# Patient Record
Sex: Female | Born: 1965 | Race: White | Hispanic: No | Smoking: Current every day smoker
Health system: Southern US, Community
[De-identification: ages and names within clinical notes are randomized; demographics above are authoritative.]

## PROBLEM LIST (undated history)

## (undated) DIAGNOSIS — K219 Gastro-esophageal reflux disease without esophagitis: Secondary | ICD-10-CM

## (undated) DIAGNOSIS — F329 Major depressive disorder, single episode, unspecified: Secondary | ICD-10-CM

## (undated) DIAGNOSIS — I509 Heart failure, unspecified: Secondary | ICD-10-CM

## (undated) DIAGNOSIS — F1910 Other psychoactive substance abuse, uncomplicated: Secondary | ICD-10-CM

## (undated) DIAGNOSIS — I10 Essential (primary) hypertension: Secondary | ICD-10-CM

## (undated) DIAGNOSIS — F319 Bipolar disorder, unspecified: Secondary | ICD-10-CM

## (undated) DIAGNOSIS — J449 Chronic obstructive pulmonary disease, unspecified: Secondary | ICD-10-CM

## (undated) DIAGNOSIS — R7301 Impaired fasting glucose: Secondary | ICD-10-CM

## (undated) DIAGNOSIS — F32A Depression, unspecified: Secondary | ICD-10-CM

## (undated) DIAGNOSIS — I309 Acute pericarditis, unspecified: Secondary | ICD-10-CM

## (undated) DIAGNOSIS — F988 Other specified behavioral and emotional disorders with onset usually occurring in childhood and adolescence: Secondary | ICD-10-CM

## (undated) HISTORY — DX: Other specified behavioral and emotional disorders with onset usually occurring in childhood and adolescence: F98.8

## (undated) HISTORY — DX: Essential (primary) hypertension: I10

## (undated) HISTORY — DX: Major depressive disorder, single episode, unspecified: F32.9

## (undated) HISTORY — PX: ORTHOPEDIC SURGERY: SHX850

## (undated) HISTORY — DX: Depression, unspecified: F32.A

## (undated) HISTORY — DX: Gastro-esophageal reflux disease without esophagitis: K21.9

## (undated) HISTORY — PX: TUBAL LIGATION: SHX77

## (undated) HISTORY — DX: Chronic obstructive pulmonary disease, unspecified: J44.9

## (undated) HISTORY — DX: Impaired fasting glucose: R73.01

---

## 1999-12-09 ENCOUNTER — Emergency Department (HOSPITAL_COMMUNITY): Admission: EM | Admit: 1999-12-09 | Discharge: 1999-12-09 | Payer: Self-pay | Admitting: Emergency Medicine

## 2003-06-20 ENCOUNTER — Ambulatory Visit (HOSPITAL_COMMUNITY): Admission: RE | Admit: 2003-06-20 | Discharge: 2003-06-20 | Payer: Self-pay | Admitting: Family Medicine

## 2004-04-07 ENCOUNTER — Ambulatory Visit (HOSPITAL_COMMUNITY): Admission: RE | Admit: 2004-04-07 | Discharge: 2004-04-07 | Payer: Self-pay | Admitting: Family Medicine

## 2006-09-16 ENCOUNTER — Inpatient Hospital Stay (HOSPITAL_COMMUNITY): Admission: EM | Admit: 2006-09-16 | Discharge: 2006-09-20 | Payer: Self-pay | Admitting: Psychiatry

## 2006-09-16 ENCOUNTER — Ambulatory Visit: Payer: Self-pay | Admitting: Psychiatry

## 2007-07-14 ENCOUNTER — Other Ambulatory Visit: Payer: Self-pay

## 2007-07-14 ENCOUNTER — Inpatient Hospital Stay (HOSPITAL_COMMUNITY): Admission: AD | Admit: 2007-07-14 | Discharge: 2007-07-18 | Payer: Self-pay | Admitting: *Deleted

## 2007-07-14 ENCOUNTER — Ambulatory Visit: Payer: Self-pay | Admitting: *Deleted

## 2008-03-19 ENCOUNTER — Encounter: Payer: Self-pay | Admitting: Orthopedic Surgery

## 2008-03-19 ENCOUNTER — Ambulatory Visit (HOSPITAL_COMMUNITY): Admission: RE | Admit: 2008-03-19 | Discharge: 2008-03-19 | Payer: Self-pay | Admitting: Family Medicine

## 2008-04-13 ENCOUNTER — Encounter: Payer: Self-pay | Admitting: Orthopedic Surgery

## 2008-04-16 ENCOUNTER — Ambulatory Visit: Payer: Self-pay | Admitting: Orthopedic Surgery

## 2008-04-16 DIAGNOSIS — G609 Hereditary and idiopathic neuropathy, unspecified: Secondary | ICD-10-CM | POA: Insufficient documentation

## 2008-04-16 DIAGNOSIS — G5600 Carpal tunnel syndrome, unspecified upper limb: Secondary | ICD-10-CM | POA: Insufficient documentation

## 2008-05-09 ENCOUNTER — Ambulatory Visit: Payer: Self-pay | Admitting: Orthopedic Surgery

## 2010-03-20 ENCOUNTER — Ambulatory Visit (HOSPITAL_COMMUNITY): Admission: RE | Admit: 2010-03-20 | Discharge: 2010-03-20 | Payer: Self-pay | Admitting: Family Medicine

## 2010-04-05 ENCOUNTER — Emergency Department (HOSPITAL_COMMUNITY): Admission: EM | Admit: 2010-04-05 | Discharge: 2010-04-05 | Payer: Self-pay | Admitting: Emergency Medicine

## 2010-09-15 ENCOUNTER — Other Ambulatory Visit (HOSPITAL_COMMUNITY): Payer: Self-pay | Admitting: Family Medicine

## 2010-09-15 DIAGNOSIS — R1011 Right upper quadrant pain: Secondary | ICD-10-CM

## 2010-09-17 ENCOUNTER — Ambulatory Visit (HOSPITAL_COMMUNITY)
Admission: RE | Admit: 2010-09-17 | Discharge: 2010-09-17 | Disposition: A | Discharge status: Home / Self Care | Payer: Federal, State, Local not specified - PPO | Source: Ambulatory Visit | Attending: Family Medicine | Admitting: Family Medicine

## 2010-09-17 DIAGNOSIS — R1011 Right upper quadrant pain: Secondary | ICD-10-CM

## 2010-10-14 NOTE — H&P (Signed)
[name], [name]          ACCOUNT NO.:  [id number]   MEDICAL RECORD NO.:  [id number]          PATIENT TYPE:  IPS   LOCATION:  0303                          FACILITY:  [facility]   PHYSICIAN:  Anselm Jungling, MD  DATE OF BIRTH:  December 09, 1965   DATE OF ADMISSION:  07/14/2007  DATE OF DISCHARGE:                       PSYCHIATRIC ADMISSION ASSESSMENT   PSYCHIATRIC ADMIT NOTE   This is a 45 year old female voluntarily admitted on July 14, 2007.   HISTORY OF PRESENT ILLNESS:  The patient presents with a history  depression and auditory hallucinations.  She states this has happened to  her at least 3 times.  She hears her mother's voice, stating her mother  calls her name, and she also hears her friend [name], also calling her  name.  Her mother is alive and well.  She has been experiencing suicidal  thoughts.  States that she knows what she could do but has no specific  plan.  Her sleeping has been decreased.  She has recently began using  heroin again.  Last use was on Friday, using IV heroin, also abusing her  Klonopin.  Her stressors are financial problems and states her job is  stressful and receiving no child support.   PAST PSYCHIATRIC HISTORY:  Second admission to [facility].  Her  first admission was when she was detoxed off of heroin.  She has no  current psychiatric outpatient services.  She was in rehab in April  2008, in [facility].   SOCIAL HISTORY:  She is a 46 year old single female.  She has two  children, age 10 and 70.  The 16 year old is with the patient's mother  at this time.  The 22 year old is in a group home.  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   FAMILY HISTORY:  Her daughter, who is bipolar.   ALCOHOL AND DRUG USE:  The patient smokes and recently has been using IV  heroin, and abusing her Klonopin.   PRIMARY CARE [name]:  Dr. Lilyan Punt.   MEDICAL PROBLEMS:  She denies any known  medical issues.   MEDICATIONS:  1. Celexa 40 mg.  2. Klonopin 1 mg b.i.d.  3. Adderall 20 mg daily prescribed by Dr. Gerda Diss.   DRUG ALLERGIES:  NO KNOWN ALLERGIES.   PHYSICAL EXAMINATION:  GENERAL:  This is an overweight middle-aged  female in no acute distress.  She was assessed at [facility].  She did  receive Ativan.  VITAL SIGNS:  Temperature is 98, 96 heart rate, 20 respirations.  Blood  pressure is 128/83.  She is 5 feet, 4 inches tall, 196 pounds. her CBC  is within normal limits, alcohol level less than 5, glucose of 102.  Urine drug screen is positive for amphetamines.  MENTAL STATE EXAM:  She is sleepy but cooperative, turned over to  participate in the interview.  She shows good eye contact, casually  dressed.  Speech is clear, normal pace and tone.  The patient's affect  is flat.  Thought process:  She is endorsing auditory hallucinations and  some suicidal thoughts but contracts  for safety and denies any  hallucinations at this time.  Cognitive function intact.  Memory is  good.  Her judgment is poor.  Insight is limited.   Axis I:  Substance-induced mood disorder.  Polyp polysubstance abuse,  rule out dependence.  Axis II:  Deferred.  Axis III:  No known medical conditions.  Axis IV:  Problems with occupation, economic problems, and other  psychosocial problems.  Axis V:  Current is 35.   PLAN:  Plans to contract for safety.  Stabilize mood and thinking.  Will  detox with the clonidine and Librium protocol that was discussed with  the patient.  The patient was notified she will not be receiving her  Adderall and Klonopin at this time, will work on relapse prevention.  Will have Risperdal at evening hours for sleep, psychotic symptoms,  racing thoughts.  Will continue to gather more history, and the case  manager will assess her follow-up plan, with either rehab or  psychiatrist.  Her tentative length of stay is 4 to 5 days.      Landry Corporal, N.P.       Anselm Jungling, MD  Electronically Signed    JO/MEDQ  D:  07/15/2007  T:  07/17/2007  Job:  [id number]

## 2010-10-14 NOTE — Discharge Summary (Signed)
[name], PASCAL          ACCOUNT NO.:  [id number]   MEDICAL RECORD NO.:  [id number]          PATIENT TYPE:  IPS   LOCATION:  0303                          FACILITY:  [facility]   PHYSICIAN:  Anselm Jungling, MD  DATE OF BIRTH:  11-Jan-1966   DATE OF ADMISSION:  07/14/2007  DATE OF DISCHARGE:  07/18/2007                               DISCHARGE SUMMARY   IDENTIFYING DATA AND REASON FOR ADMISSION:  This was an inpatient  psychiatric admission for [name], a 45 year old single white female  admitted for treatment of heroin dependence and depression.  She stated  that she had been through chemical dependency detoxification and  treatment in the past.  She came to [place] on a regimen of Celexa.  Please  refer to the admission note for further details pertaining to the  symptoms, circumstances, and history that led to hospitalization.  She  was given initial Axis I diagnosis of heroin dependence and substance-  induced mood disorder.   MEDICAL AND LABORATORY:  The patient was medically and physically  assessed by the psychiatric nurse practitioner.  She was in good health  without any active or chronic medical problems.   HOSPITAL COURSE:  The patient was admitted to the adult inpatient  psychiatric service.  She presented as an obese woman who was alert,  fully oriented, with depressed mood and flat affect.  She was  nonpsychotic.  She verbalizes a desire to go through the detoxification  process, even though she recognized that it would be uncomfortable.  She  denied suicidal ideation.   She was placed on Librium and clonidine withdrawal protocols.  Her  withdrawal was uneventful.  On the fourth hospital day there was a  family session involving the patient had her mother.  Discharge planning  was discussed, including support groups and 12-step groups.  Also,  psychiatric aftercare was discussed.   On the following day, the patient was pleasant, bright, and well-  organized.  She felt  positive about her family meeting from the day  before.  She was no longer having any withdrawal symptoms.   On the day prior to her discharge, and my final meeting with her, she  had been started on a trial of Abilify 5 mg.  This was judged to be  unnecessary, and was discontinued.   The patient agreed to following aftercare plan.   AFTERCARE:  The patient was to followup with the [facility] with an  appointment on July 19, 2007.   DISCHARGE MEDICATIONS:  Celexa 40 mg daily, which was to be followed by  the [facility] psychiatrist.   DISCHARGE DIAGNOSES:  AXIS I:  Opiate dependence, early remission and  substance-induced mood disorder, rule out major depressive disorder,  recurrent.  AXIS II:  Deferred.  AXIS III:  No acute or chronic illnesses.  AXIS IV:  Stressors severe.  AXIS V:  GAF on discharge 60.      Anselm Jungling, MD  Electronically Signed     SPB/MEDQ  D:  07/18/2007  T:  07/19/2007  Job:  [id number]

## 2010-10-17 NOTE — Discharge Summary (Signed)
NAMEANURADHA, CHABOT          ACCOUNT NO.:  [id number]   MEDICAL RECORD NO.:  [id number]          PATIENT TYPE:  IPS   LOCATION:  0303                          FACILITY:  [facility]   PHYSICIAN:  Jasmine Pang, M.D. DATE OF BIRTH:  1966/01/14   DATE OF ADMISSION:  09/16/2006  DATE OF DISCHARGE:  09/20/2006                               DISCHARGE SUMMARY   IDENTIFICATION:  A 46 year old white female who was admitted on a  voluntary basis on September 16, 2006.   HISTORY OF PRESENT ILLNESS:  The patient requests detox from opiates.  She planned to enter [facility] at the end of the month but ran out  of heroin and the last use was Sunday, June 14, 2006.  She was using  7 bags of heroin daily since November 2007.  She is using 4-6 Percocet  or Tylox prior to that for 2 years for generalized muscle aches and  pains.  She has had positive recent IV drug use and occasional Xanax,  though not regular.  She denies the use of alcohol and cocaine.  UDS was  negative.  She has had 1 prior admission to [facility] in her 42s  for depression.  She has had a history of 2-3 prior suicide attempts.  Her father has bipolar disorder.  She smokes 1 packs of cigarettes per  day.  She is on no medications.  She has no known drug allergies.   PHYSICAL FINDINGS:  PULMONARY:  Patient was an overall healthy female  with no acute medical or physical problems except for wheezing  throughout her lungs and frequent coarse bronchial sounds.   ADMISSION LABORATORY:  CBC was within normal limits.  Basic metabolic  panel was grossly within normal limits.  Calcium was 9.2.  The UDS was  negative.  TSH was 0.807, which was within normal limits.  A liver  profile was remarkable for a slightly decreased total protein of 5.7 (6-  8.3).  Hepatitis profile was negative.  GC and chlamydia probes were  negative.  RPR nonreactive.  HIV nonreactive.   HOSPITAL COURSE:  Upon admission, patient was placed on the  clonidine  detox protocol.  She was also placed on Bentyl 20 mg p.o. now, Flovent  44 mcg 2 puffs b.i.d., albuterol 90 __________ MDI 2 puffs q.4 h. p.r.n.  asthma, DuoNeb inhaler treatment via nebulizer now times 1, Librium 25  mg now p.o., trazodone 50 mg nightly p.r.n. insomnia.  She was also  placed on a nicotine patch 21 mg daily.  Patient tolerated her  medications well with no significant side effects.  She detoxed fairly  well.  She stated she felt she had ADHD, was very distractible,  impulsive, inattentive, disorganized, poor concentration and  procrastination.  She talked at length about problems with her troubled  49 year old daughter.   She continued to tolerate the detox protocol well with no side effects.  On September 20, 2006, mental status had improved markedly from admission  status.  Patient was friendly, cooperative, talkative with good eye  contact.  Speech was normal rate and flow.  Psychomotor activity was  within normal limits.  The mood was euthymic.  Affect wide range.  There  was no suicidal or homicidal ideation.  No thoughts of self-injurious  behavior.  No auditory or visual hallucinations.  No paranoia or  delusions.  Thoughts were logical and goal-directed.  Thought content no  predominant theme.  Cognitive was grossly back to baseline.   DISCHARGE DIAGNOSES:  AXIS I:  1. Opiate dependence.  2. Depressive disorder not otherwise specified.  AXIS II:  None.  AXIS III:  Myalgia secondary to opiate withdrawal, asthma, tobacco  abuse.  AXIS IV:  Severe (parenting issues, burden of her psychiatric and  chemical dependence illness).  AXIS V:  Global assessment of functioning upon discharge was 50.  Global  assessment of functioning upon admission was 25.  Global assessment of  functioning highest past year 58-61.   DISCHARGE PLANS:  There were no specific activity level or dietary  restrictions.  Patient was placed on Flovent 44 mcg MDI 2 puffs twice  daily  and albuterol inhaler 2 puffs every 2 hours.   POST-HOSPITAL CARE PLANS:  The patient will go to [facility] for  follow up on September 24, 2006.      Jasmine Pang, M.D.  Electronically Signed     BHS/MEDQ  D:  09/27/2006  T:  09/28/2006  Job:  [id number]

## 2011-02-20 LAB — CBC
HCT: 43.4
Hemoglobin: 14.9
MCHC: 34.3
MCV: 91.2
Platelets: 332
RBC: 4.75
RDW: 14.1
WBC: 7

## 2011-02-20 LAB — BASIC METABOLIC PANEL
BUN: 6
CO2: 29
Calcium: 9.1
Chloride: 104
Creatinine, Ser: 0.7
GFR calc Af Amer: >60
GFR calc non Af Amer: >60
Glucose, Bld: 102 — ABNORMAL HIGH
Potassium: 4.3
Sodium: 136

## 2011-02-20 LAB — ETHANOL: Alcohol, Ethyl (B): <5

## 2011-02-20 LAB — DIFFERENTIAL
Basophils Absolute: 0
Basophils Relative: 0
Eosinophils Absolute: 0.1
Eosinophils Relative: 2
Lymphocytes Relative: 30
Lymphs Abs: 2.1
Monocytes Absolute: 0.4
Monocytes Relative: 6
Neutro Abs: 4.3
Neutrophils Relative %: 61

## 2011-02-20 LAB — RAPID URINE DRUG SCREEN, HOSP PERFORMED
Amphetamines: POSITIVE — AB
Barbiturates: NOT DETECTED
Benzodiazepines: NOT DETECTED
Cocaine: NOT DETECTED
Opiates: NOT DETECTED
Tetrahydrocannabinol: NOT DETECTED

## 2011-08-17 ENCOUNTER — Emergency Department (HOSPITAL_COMMUNITY)
Admission: EM | Admit: 2011-08-17 | Discharge: 2011-08-18 | Disposition: A | Discharge status: Home / Self Care | Payer: Federal, State, Local not specified - PPO | Attending: Emergency Medicine | Admitting: Emergency Medicine

## 2011-08-17 ENCOUNTER — Emergency Department (HOSPITAL_COMMUNITY): Payer: Federal, State, Local not specified - PPO

## 2011-08-17 ENCOUNTER — Encounter (HOSPITAL_COMMUNITY): Payer: Self-pay | Admitting: *Deleted

## 2011-08-17 DIAGNOSIS — R51 Headache: Secondary | ICD-10-CM | POA: Insufficient documentation

## 2011-08-17 DIAGNOSIS — IMO0001 Reserved for inherently not codable concepts without codable children: Secondary | ICD-10-CM | POA: Insufficient documentation

## 2011-08-17 DIAGNOSIS — R05 Cough: Secondary | ICD-10-CM | POA: Insufficient documentation

## 2011-08-17 DIAGNOSIS — R0602 Shortness of breath: Secondary | ICD-10-CM | POA: Insufficient documentation

## 2011-08-17 DIAGNOSIS — R509 Fever, unspecified: Secondary | ICD-10-CM | POA: Insufficient documentation

## 2011-08-17 DIAGNOSIS — J189 Pneumonia, unspecified organism: Secondary | ICD-10-CM

## 2011-08-17 DIAGNOSIS — R070 Pain in throat: Secondary | ICD-10-CM | POA: Insufficient documentation

## 2011-08-17 DIAGNOSIS — R059 Cough, unspecified: Secondary | ICD-10-CM | POA: Insufficient documentation

## 2011-08-17 MED ORDER — MOXIFLOXACIN HCL IN NACL 400 MG/250ML IV SOLN: 400.0000 mg | Freq: Once | INTRAVENOUS | Status: DC

## 2011-08-17 MED ORDER — PREDNISONE 10 MG PO TABS: 20.0000 mg | ORAL_TABLET | Freq: Every day | ORAL | Status: DC

## 2011-08-17 MED ORDER — MOXIFLOXACIN HCL 400 MG PO TABS
ORAL_TABLET | ORAL | Status: AC
  Administered 2011-08-17
  Filled 2011-08-17: qty 1

## 2011-08-17 MED ORDER — MOXIFLOXACIN HCL 400 MG PO TABS: 400.0000 mg | ORAL_TABLET | Freq: Every day | ORAL | Status: AC

## 2011-08-17 MED ORDER — ALBUTEROL SULFATE HFA 108 (90 BASE) MCG/ACT IN AERS
2.0000 | INHALATION_SPRAY | Freq: Four times a day (QID) | RESPIRATORY_TRACT | Status: DC
  Administered 2011-08-18: 2 via RESPIRATORY_TRACT
  Filled 2011-08-17: qty 6.7

## 2011-08-17 MED ORDER — PREDNISONE 20 MG PO TABS
60.0000 mg | ORAL_TABLET | Freq: Once | ORAL | Status: AC
  Administered 2011-08-17: 60 mg via ORAL
  Filled 2011-08-17: qty 3

## 2011-08-17 NOTE — ED Notes (Signed)
Left in c/o for transport home; a&ox4; in no distress; instructions/prescriptions reviewed-verbalizes understanding. Awaiting respiratory therapist for albuterol inhaler and instruction.

## 2011-08-17 NOTE — ED Provider Notes (Signed)
History   This chart was scribed for Shelda Jakes, MD by Sofie Rower. The patient was seen in room APA14/APA14 and the patient's care was started at 9:48PM.    CSN: [id number]  Arrival date & time 08/17/11  1729   First MD Initiated Contact with Patient 08/17/11 1959      Chief Complaint  Patient presents with  . Shortness of Breath    (Consider location/radiation/quality/duration/timing/severity/associated sxs/prior treatment) HPI  [name] is a 46 y.o. female who presents to the Emergency Department complaining of moderate, constant shortness of breath onset four days with associated symptoms of cough, sore throat, fever (101 on Saturday), myalgia, headaches. Pt states she "went to urgent care (saturday afternoon), given Zithromax to treat a sinus infection, went to work today, and is now having a hard time breathing. Pt states she has finished the Zithromax antibiotics. Pt has a hx of tubal ligation, orthopedic surgery.   Pt denies facial pain, vomiting, diarrhea, rash, back pain, neck pain.  PCP is Dr. Gerda Diss.   Past Surgical History  Procedure Date  . Tubal ligation   . Orthopedic surgery     No family history on file.  History  Substance Use Topics  . Smoking status: Former Games developer  . Smokeless tobacco: Not on file  . Alcohol Use: No    OB History    Grav Para Term Preterm Abortions TAB SAB Ect Mult Living                  Review of Systems  All other systems reviewed and are negative.    10 Systems reviewed and are negative for acute change except as noted in the HPI.   Allergies  Review of patient's allergies indicates no known allergies.  Home Medications  No current outpatient prescriptions on file.  BP 162/72  Pulse 106  Temp(Src) 98.2 F (36.8 C) (Oral)  Resp 16  Ht 5\' 4"  (1.626 m)  Wt 200 lb (90.719 kg)  BMI 34.33 kg/m2  SpO2 98%  Physical Exam  Nursing note and vitals reviewed. Constitutional: She is oriented to  person, place, and time. She appears well-developed and well-nourished.  HENT:  Head: Normocephalic and atraumatic.  Right Ear: External ear normal.  Left Ear: External ear normal.  Nose: Nose normal.  Eyes: Conjunctivae and EOM are normal. No scleral icterus.  Neck: Normal range of motion. Neck supple. No thyromegaly present.  Cardiovascular: Normal rate, regular rhythm and normal heart sounds.  Exam reveals no gallop and no friction rub.   No murmur heard. Pulmonary/Chest: Breath sounds normal. No stridor. She has no wheezes. She has no rales. She exhibits no tenderness.  Abdominal: Bowel sounds are normal. She exhibits no distension. There is no tenderness. There is no rebound.  Musculoskeletal: Normal range of motion. She exhibits no edema.  Lymphadenopathy:    She has no cervical adenopathy.  Neurological: She is alert and oriented to person, place, and time. Coordination normal.  Skin: Skin is warm and dry. No rash noted. No erythema.  Psychiatric: She has a normal mood and affect. Her behavior is normal.    ED Course  Procedures (including critical care time)  DIAGNOSTIC STUDIES: Oxygen Saturation is 98% on room air, normal by my interpretation.    COORDINATION OF CARE:  Dg Chest 2 View  08/17/2011  *RADIOLOGY REPORT*  Clinical Data: Shortness of breath and cough for four days; history of smoking.  CHEST - 2 VIEW  Comparison: Chest radiograph  performed 04/05/2004  Findings: The lungs are well-aerated.  Mild patchy bilateral airspace opacities are noted, raising concern for multifocal pneumonia, right greater than left.  There is no evidence of pleural effusion or pneumothorax.  The heart is normal in size; the mediastinal contour is within normal limits.  No acute osseous abnormalities are seen.  IMPRESSION: Mild patchy bilateral airspace opacities raise concern for multifocal pneumonia, right greater than left.  Original Report Authenticated By: Tonia Ghent, M.D.        Labs Reviewed - No data to display No results found.   No diagnosis found.  9:52PM- EDP at bedside discusses treatment plan concerning viral infection and x-ray.   MDM  Chest x-ray shows bilateral pneumonia most likely community acquired pneumonia based on the patient's past history patient's mental Zithromax so will switch to Avelox by mouth also supplemented with prednisone for the extensive cough and albuterol inhaler. Patient continue her current cough medicine that she has. Patient has followup local primary care doctor will return if not improving in 2 days. Patient is nontoxic and in no acute distress oxygen saturation 98% on room air.      I personally performed the services described in this documentation, which was scribed in my presence. The recorded information has been reviewed and considered.     Shelda Jakes, MD 08/17/11 [phone]

## 2011-08-17 NOTE — ED Notes (Signed)
States she has been sick for 4 days, shortness of breath worse today

## 2011-08-17 NOTE — Discharge Instructions (Signed)
Pneumonia, Adult Pneumonia is an infection of the lungs. It may be caused by a germ (virus or bacteria). Some types of pneumonia can spread easily from person to person. This can happen when you cough or sneeze. HOME CARE  Only take medicine as told by your doctor.   Take your medicine (antibiotics) as told. Finish it even if you start to feel better.   Do not smoke.   You may use a vaporizer or humidifier in your room. This can help loosen thick spit (mucus).   Sleep so you are almost sitting up (semi-upright). This helps reduce coughing.   Rest.  A shot (vaccine) can help prevent pneumonia. Shots are often advised for:  People over 40 years old.   Patients on chemotherapy.   People with long-term (chronic) lung problems.   People with immune system problems.  GET HELP RIGHT AWAY IF:   You are getting worse.   You cannot control your cough, and you are losing sleep.   You cough up blood.   Your pain gets worse, even with medicine.   You have a fever.   Any of your problems are getting worse, not better.   You have shortness of breath or chest pain.  MAKE SURE YOU:   Understand these instructions.   Will watch your condition.   Will get help right away if you are not doing well or get worse.  Document Released: 11/04/2007 Document Revised: 05/07/2011 Document Reviewed: 08/08/2010 [facility] Patient Information 2012 [place], [place].  Take antibiotic as directed and take prednisone as directed use albuterol inhaler 2 puffs every 6 hours for one week rest increase fluids return if not getting better in 2 days followup with primary care Dr. in 2-3 days for recheck. Work excuse given for the next 4 days.

## 2012-03-28 ENCOUNTER — Ambulatory Visit (HOSPITAL_COMMUNITY)
Admission: RE | Admit: 2012-03-28 | Discharge: 2012-03-28 | Disposition: A | Discharge status: Home / Self Care | Payer: Federal, State, Local not specified - PPO | Source: Ambulatory Visit | Attending: Family Medicine | Admitting: Family Medicine

## 2012-03-28 ENCOUNTER — Other Ambulatory Visit: Payer: Self-pay

## 2012-03-28 ENCOUNTER — Other Ambulatory Visit: Payer: Self-pay | Admitting: Family Medicine

## 2012-03-28 DIAGNOSIS — R0600 Dyspnea, unspecified: Secondary | ICD-10-CM

## 2012-03-28 DIAGNOSIS — R0609 Other forms of dyspnea: Secondary | ICD-10-CM | POA: Insufficient documentation

## 2012-03-28 DIAGNOSIS — R0989 Other specified symptoms and signs involving the circulatory and respiratory systems: Secondary | ICD-10-CM | POA: Insufficient documentation

## 2012-03-28 MED ORDER — ALBUTEROL SULFATE (5 MG/ML) 0.5% IN NEBU
2.5000 mg | INHALATION_SOLUTION | Freq: Once | RESPIRATORY_TRACT | Status: AC
  Administered 2012-03-28: 2.5 mg via RESPIRATORY_TRACT

## 2012-03-28 NOTE — Procedures (Signed)
[name], [name]          ACCOUNT NO.:  [id number]  MEDICAL RECORD NO.:  [id number]  LOCATION:  RESP                          FACILITY:  [facility]  PHYSICIAN:  Mercadez Heitman L. Juanetta Gosling, M.D.DATE OF BIRTH:  09/23/1965  DATE OF PROCEDURE: DATE OF DISCHARGE:                           PULMONARY FUNCTION TEST   Reason for pulmonary function testing is dyspnea. 1. Spirometry shows a mild ventilatory defect with evidence of airflow     obstruction. 2. There is improvement which reaches the level of significance with     inhaled bronchodilator. 3. Based on the patient's smoking history, this study is consistent     with COPD.     [name] L. Juanetta Gosling, M.D.     ELH/MEDQ  D:  03/28/2012  T:  03/28/2012  Job:  [id number]  cc:   Donna Bernard, M.D. Fax: [phone]

## 2012-10-06 ENCOUNTER — Other Ambulatory Visit: Payer: Self-pay | Admitting: *Deleted

## 2012-10-07 ENCOUNTER — Encounter: Payer: Self-pay | Admitting: *Deleted

## 2012-10-12 ENCOUNTER — Other Ambulatory Visit: Payer: Self-pay | Admitting: *Deleted

## 2012-10-12 MED ORDER — TIOTROPIUM BROMIDE MONOHYDRATE 18 MCG IN CAPS: 18.0000 ug | ORAL_CAPSULE | Freq: Every day | RESPIRATORY_TRACT | Status: DC

## 2013-06-02 ENCOUNTER — Telehealth: Payer: Self-pay | Admitting: *Deleted

## 2013-06-02 NOTE — Telephone Encounter (Signed)
Spoke with patient. She said she hasn't been feeling well. She took her BP at [facility] yesterday and it was 185/94. She said she had chest pain a couple of days ago. I told her that she needs to go to the ER to further evaluate her b/c we were unfortunately booked for the day. Patient verbalized understanding.

## 2013-06-08 ENCOUNTER — Other Ambulatory Visit: Payer: Self-pay | Admitting: Family Medicine

## 2013-08-09 ENCOUNTER — Other Ambulatory Visit: Payer: Self-pay | Admitting: Family Medicine

## 2013-09-12 ENCOUNTER — Other Ambulatory Visit: Payer: Self-pay | Admitting: Family Medicine

## 2013-10-16 ENCOUNTER — Other Ambulatory Visit: Payer: Self-pay | Admitting: Family Medicine

## 2013-10-31 ENCOUNTER — Emergency Department (HOSPITAL_COMMUNITY)
Admission: EM | Admit: 2013-10-31 | Discharge: 2013-10-31 | Disposition: A | Discharge status: Home / Self Care | Payer: Federal, State, Local not specified - PPO | Attending: Emergency Medicine | Admitting: Emergency Medicine

## 2013-10-31 ENCOUNTER — Encounter (HOSPITAL_COMMUNITY): Payer: Self-pay | Admitting: Emergency Medicine

## 2013-10-31 DIAGNOSIS — I10 Essential (primary) hypertension: Secondary | ICD-10-CM | POA: Insufficient documentation

## 2013-10-31 DIAGNOSIS — J4489 Other specified chronic obstructive pulmonary disease: Secondary | ICD-10-CM | POA: Insufficient documentation

## 2013-10-31 DIAGNOSIS — S79919A Unspecified injury of unspecified hip, initial encounter: Secondary | ICD-10-CM | POA: Insufficient documentation

## 2013-10-31 DIAGNOSIS — Y92410 Unspecified street and highway as the place of occurrence of the external cause: Secondary | ICD-10-CM | POA: Insufficient documentation

## 2013-10-31 DIAGNOSIS — S79929A Unspecified injury of unspecified thigh, initial encounter: Secondary | ICD-10-CM

## 2013-10-31 DIAGNOSIS — S4980XA Other specified injuries of shoulder and upper arm, unspecified arm, initial encounter: Secondary | ICD-10-CM | POA: Insufficient documentation

## 2013-10-31 DIAGNOSIS — S8990XA Unspecified injury of unspecified lower leg, initial encounter: Secondary | ICD-10-CM | POA: Insufficient documentation

## 2013-10-31 DIAGNOSIS — J449 Chronic obstructive pulmonary disease, unspecified: Secondary | ICD-10-CM | POA: Insufficient documentation

## 2013-10-31 DIAGNOSIS — F3289 Other specified depressive episodes: Secondary | ICD-10-CM | POA: Insufficient documentation

## 2013-10-31 DIAGNOSIS — F988 Other specified behavioral and emotional disorders with onset usually occurring in childhood and adolescence: Secondary | ICD-10-CM | POA: Insufficient documentation

## 2013-10-31 DIAGNOSIS — Z87891 Personal history of nicotine dependence: Secondary | ICD-10-CM | POA: Insufficient documentation

## 2013-10-31 DIAGNOSIS — F329 Major depressive disorder, single episode, unspecified: Secondary | ICD-10-CM | POA: Insufficient documentation

## 2013-10-31 DIAGNOSIS — IMO0002 Reserved for concepts with insufficient information to code with codable children: Secondary | ICD-10-CM | POA: Insufficient documentation

## 2013-10-31 DIAGNOSIS — S99929A Unspecified injury of unspecified foot, initial encounter: Secondary | ICD-10-CM

## 2013-10-31 DIAGNOSIS — S99919A Unspecified injury of unspecified ankle, initial encounter: Secondary | ICD-10-CM

## 2013-10-31 DIAGNOSIS — Z8719 Personal history of other diseases of the digestive system: Secondary | ICD-10-CM | POA: Insufficient documentation

## 2013-10-31 DIAGNOSIS — Y9389 Activity, other specified: Secondary | ICD-10-CM | POA: Insufficient documentation

## 2013-10-31 DIAGNOSIS — T148XXA Other injury of unspecified body region, initial encounter: Secondary | ICD-10-CM

## 2013-10-31 DIAGNOSIS — S46909A Unspecified injury of unspecified muscle, fascia and tendon at shoulder and upper arm level, unspecified arm, initial encounter: Secondary | ICD-10-CM | POA: Insufficient documentation

## 2013-10-31 MED ORDER — INDOMETHACIN 25 MG PO CAPS: 25.0000 mg | ORAL_CAPSULE | Freq: Three times a day (TID) | ORAL | Status: DC

## 2013-10-31 MED ORDER — CYCLOBENZAPRINE HCL 10 MG PO TABS: 10.0000 mg | ORAL_TABLET | Freq: Three times a day (TID) | ORAL | Status: DC

## 2013-10-31 MED ORDER — ACETAMINOPHEN-CODEINE #3 300-30 MG PO TABS: 1.0000 | ORAL_TABLET | Freq: Four times a day (QID) | ORAL | Status: DC | PRN
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
## 2013-10-31 NOTE — ED Notes (Signed)
Pt verbalized understanding of no driving within 4 hours of taking tylenol #3 due to med causes drowsiness
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
## 2013-10-31 NOTE — ED Provider Notes (Signed)
Medical screening examination/treatment/procedure(s) were performed by non-physician practitioner and as supervising physician I was immediately available for consultation/collaboration.     Keiland Pickering, MD 10/31/13 2115 

## 2013-11-23 ENCOUNTER — Other Ambulatory Visit (HOSPITAL_COMMUNITY): Payer: Self-pay | Admitting: Preventative Medicine

## 2013-11-23 DIAGNOSIS — M5416 Radiculopathy, lumbar region: Secondary | ICD-10-CM

## 2013-11-30 ENCOUNTER — Other Ambulatory Visit: Payer: Self-pay | Admitting: Family Medicine

## 2013-12-07 ENCOUNTER — Encounter (HOSPITAL_COMMUNITY): Payer: Self-pay

## 2013-12-07 ENCOUNTER — Ambulatory Visit (HOSPITAL_COMMUNITY)
Admission: RE | Admit: 2013-12-07 | Discharge: 2013-12-07 | Disposition: A | Discharge status: Home / Self Care | Payer: Federal, State, Local not specified - PPO | Source: Ambulatory Visit | Attending: Preventative Medicine | Admitting: Preventative Medicine

## 2013-12-07 DIAGNOSIS — M48061 Spinal stenosis, lumbar region without neurogenic claudication: Secondary | ICD-10-CM | POA: Insufficient documentation

## 2013-12-07 DIAGNOSIS — M545 Low back pain, unspecified: Secondary | ICD-10-CM | POA: Insufficient documentation

## 2013-12-07 DIAGNOSIS — M47817 Spondylosis without myelopathy or radiculopathy, lumbosacral region: Secondary | ICD-10-CM | POA: Insufficient documentation

## 2013-12-07 DIAGNOSIS — M5126 Other intervertebral disc displacement, lumbar region: Secondary | ICD-10-CM | POA: Insufficient documentation

## 2013-12-07 DIAGNOSIS — M5416 Radiculopathy, lumbar region: Secondary | ICD-10-CM

## 2013-12-11 ENCOUNTER — Other Ambulatory Visit: Payer: Self-pay | Admitting: Preventative Medicine

## 2013-12-11 DIAGNOSIS — M5416 Radiculopathy, lumbar region: Secondary | ICD-10-CM

## 2013-12-13 ENCOUNTER — Ambulatory Visit
Admission: RE | Admit: 2013-12-13 | Discharge: 2013-12-13 | Disposition: A | Discharge status: Home / Self Care | Payer: Federal, State, Local not specified - PPO | Source: Ambulatory Visit | Attending: Preventative Medicine | Admitting: Preventative Medicine

## 2013-12-13 DIAGNOSIS — M5416 Radiculopathy, lumbar region: Secondary | ICD-10-CM

## 2013-12-13 MED ORDER — METHYLPREDNISOLONE ACETATE 40 MG/ML INJ SUSP (RADIOLOG
120.0000 mg | Freq: Once | INTRAMUSCULAR | Status: AC
  Administered 2013-12-13: 120 mg via EPIDURAL

## 2013-12-13 MED ORDER — IOHEXOL 180 MG/ML  SOLN
1.0000 mL | Freq: Once | INTRAMUSCULAR | Status: AC | PRN
  Administered 2013-12-13: 1 mL via EPIDURAL

## 2013-12-13 NOTE — Discharge Instructions (Signed)

## 2014-01-01 ENCOUNTER — Other Ambulatory Visit: Payer: Self-pay | Admitting: Preventative Medicine

## 2014-01-01 DIAGNOSIS — G8929 Other chronic pain: Secondary | ICD-10-CM

## 2014-01-01 DIAGNOSIS — M545 Low back pain: Principal | ICD-10-CM

## 2014-01-04 ENCOUNTER — Ambulatory Visit
Admission: RE | Admit: 2014-01-04 | Discharge: 2014-01-04 | Disposition: A | Discharge status: Home / Self Care | Payer: Federal, State, Local not specified - PPO | Source: Ambulatory Visit | Attending: Preventative Medicine | Admitting: Preventative Medicine

## 2014-01-04 DIAGNOSIS — M545 Low back pain: Principal | ICD-10-CM

## 2014-01-04 DIAGNOSIS — G8929 Other chronic pain: Secondary | ICD-10-CM

## 2014-01-04 MED ORDER — IOHEXOL 180 MG/ML  SOLN
1.0000 mL | Freq: Once | INTRAMUSCULAR | Status: AC | PRN
  Administered 2014-01-04: 1 mL via EPIDURAL

## 2014-01-04 MED ORDER — METHYLPREDNISOLONE ACETATE 40 MG/ML INJ SUSP (RADIOLOG
120.0000 mg | Freq: Once | INTRAMUSCULAR | Status: AC
  Administered 2014-01-04: 120 mg via EPIDURAL

## 2014-01-08 ENCOUNTER — Other Ambulatory Visit: Payer: Self-pay | Admitting: Family Medicine

## 2014-03-01 ENCOUNTER — Other Ambulatory Visit: Payer: Self-pay | Admitting: Family Medicine

## 2014-05-29 ENCOUNTER — Other Ambulatory Visit: Payer: Self-pay | Admitting: Family Medicine

## 2014-05-29 NOTE — Telephone Encounter (Signed)
Patient not see in 2 years

## 2014-05-30 NOTE — Telephone Encounter (Signed)
May have 1 refill with no additional refills will need ov before more refills

## 2014-08-07 ENCOUNTER — Other Ambulatory Visit: Payer: Self-pay | Admitting: Family Medicine

## 2014-08-07 NOTE — Telephone Encounter (Signed)
Patient has not been seen in [facility]

## 2014-09-24 ENCOUNTER — Other Ambulatory Visit: Payer: Self-pay | Admitting: Family Medicine

## 2014-10-04 ENCOUNTER — Encounter: Payer: Self-pay | Admitting: Family Medicine

## 2014-10-04 ENCOUNTER — Ambulatory Visit (INDEPENDENT_AMBULATORY_CARE_PROVIDER_SITE_OTHER): Payer: Federal, State, Local not specified - PPO | Admitting: Family Medicine

## 2014-10-04 VITALS — BP 140/90 | Ht 63.0 in | Wt 224.0 lb

## 2014-10-04 DIAGNOSIS — E669 Obesity, unspecified: Secondary | ICD-10-CM | POA: Diagnosis not present

## 2014-10-04 DIAGNOSIS — J449 Chronic obstructive pulmonary disease, unspecified: Secondary | ICD-10-CM

## 2014-10-04 DIAGNOSIS — Z13220 Encounter for screening for lipoid disorders: Secondary | ICD-10-CM | POA: Diagnosis not present

## 2014-10-04 DIAGNOSIS — Z131 Encounter for screening for diabetes mellitus: Secondary | ICD-10-CM | POA: Diagnosis not present

## 2014-10-04 MED ORDER — TIOTROPIUM BROMIDE MONOHYDRATE 18 MCG IN CAPS: ORAL_CAPSULE | RESPIRATORY_TRACT | Status: DC

## 2014-10-04 MED ORDER — ALBUTEROL SULFATE HFA 108 (90 BASE) MCG/ACT IN AERS: 2.0000 | INHALATION_SPRAY | Freq: Four times a day (QID) | RESPIRATORY_TRACT | Status: DC | PRN

## 2014-10-04 MED ORDER — PHENTERMINE HCL 37.5 MG PO CAPS: 37.5000 mg | ORAL_CAPSULE | ORAL | Status: DC

## 2014-10-04 NOTE — Progress Notes (Signed)
   Subjective:    Patient ID: [name], female    DOB: January 04, 1966, 49 y.o.   MRN: [id number]  HPI Patient is here today for medication refills on her albuterol inhaler and Sprivia inhaler. Patient states that she would like to discuss a medication she could take to help suppress her appetite.  She denies any chest tightness pressure pain shortness breath other than the shortness of breath and comes with COPD.  She relates last year she lost over 50 pounds but then gained it back she states she has a hard time suppressing her appetite  Review of Systems  Constitutional: Negative for activity change, appetite change and fatigue.  HENT: Negative for congestion.   Respiratory: Negative for cough.   Cardiovascular: Negative for chest pain.  Gastrointestinal: Negative for abdominal pain.  Endocrine: Negative for polydipsia and polyphagia.  Neurological: Negative for weakness.  Psychiatric/Behavioral: Negative for confusion.       Objective:   Physical Exam  Constitutional: She appears well-nourished. No distress.  Cardiovascular: Normal rate, regular rhythm and normal heart sounds.   No murmur heard. Pulmonary/Chest: Effort normal and breath sounds normal. No respiratory distress.  Musculoskeletal: She exhibits no edema.  Lymphadenopathy:    She has no cervical adenopathy.  Neurological: She is alert. She exhibits normal muscle tone.  Psychiatric: Her behavior is normal.  Vitals reviewed.   Long discussion held with patient regarding the different medications available to [place] reduce eating we also talked about healthy eating regular physical activity      Assessment & Plan:  COPD-patient no longer smokes. Refills on inhalers given.  Obesity the importance of watching diet regular physical activity discussed. Options regarding medications were discussed. Patient does want to try Adipex, 1 every morning for the next 3 months if it causes elevated blood pressure or  elevated heart rates she is to stop taking medication.

## 2014-10-09 ENCOUNTER — Encounter: Payer: Self-pay | Admitting: Family Medicine

## 2014-10-09 LAB — LIPID PANEL
CHOL/HDL RATIO: 3.5 ratio (ref 0.0–4.4)
Cholesterol, Total: 206 mg/dL — ABNORMAL HIGH (ref 100–199)
HDL: 59 mg/dL (ref >39)
LDL Calculated: 131 mg/dL — ABNORMAL HIGH (ref 0–99)
Triglycerides: 79 mg/dL (ref 0–149)
VLDL Cholesterol Cal: 16 mg/dL (ref 5–40)

## 2014-10-09 LAB — GLUCOSE, RANDOM: GLUCOSE: 99 mg/dL (ref 65–99)

## 2015-02-12 ENCOUNTER — Emergency Department (HOSPITAL_COMMUNITY)
Admission: EM | Admit: 2015-02-12 | Discharge: 2015-02-12 | Disposition: A | Discharge status: Home / Self Care | Payer: Federal, State, Local not specified - PPO | Attending: Emergency Medicine | Admitting: Emergency Medicine

## 2015-02-12 ENCOUNTER — Emergency Department (HOSPITAL_COMMUNITY): Payer: Federal, State, Local not specified - PPO

## 2015-02-12 ENCOUNTER — Encounter (HOSPITAL_COMMUNITY): Payer: Self-pay | Admitting: Emergency Medicine

## 2015-02-12 DIAGNOSIS — I10 Essential (primary) hypertension: Secondary | ICD-10-CM | POA: Insufficient documentation

## 2015-02-12 DIAGNOSIS — Z8719 Personal history of other diseases of the digestive system: Secondary | ICD-10-CM | POA: Diagnosis not present

## 2015-02-12 DIAGNOSIS — R079 Chest pain, unspecified: Secondary | ICD-10-CM | POA: Insufficient documentation

## 2015-02-12 DIAGNOSIS — Z87891 Personal history of nicotine dependence: Secondary | ICD-10-CM | POA: Insufficient documentation

## 2015-02-12 DIAGNOSIS — R112 Nausea with vomiting, unspecified: Secondary | ICD-10-CM

## 2015-02-12 DIAGNOSIS — R197 Diarrhea, unspecified: Secondary | ICD-10-CM | POA: Insufficient documentation

## 2015-02-12 DIAGNOSIS — R1013 Epigastric pain: Secondary | ICD-10-CM | POA: Insufficient documentation

## 2015-02-12 DIAGNOSIS — J449 Chronic obstructive pulmonary disease, unspecified: Secondary | ICD-10-CM | POA: Insufficient documentation

## 2015-02-12 DIAGNOSIS — R1010 Upper abdominal pain, unspecified: Secondary | ICD-10-CM | POA: Insufficient documentation

## 2015-02-12 DIAGNOSIS — Z79899 Other long term (current) drug therapy: Secondary | ICD-10-CM | POA: Diagnosis not present

## 2015-02-12 DIAGNOSIS — Z8659 Personal history of other mental and behavioral disorders: Secondary | ICD-10-CM | POA: Insufficient documentation

## 2015-02-12 DIAGNOSIS — Z9851 Tubal ligation status: Secondary | ICD-10-CM | POA: Insufficient documentation

## 2015-02-12 DIAGNOSIS — R109 Unspecified abdominal pain: Secondary | ICD-10-CM

## 2015-02-12 LAB — URINALYSIS, ROUTINE W REFLEX MICROSCOPIC
Bilirubin Urine: NEGATIVE
GLUCOSE, UA: NEGATIVE mg/dL
HGB URINE DIPSTICK: NEGATIVE
Ketones, ur: NEGATIVE mg/dL
LEUKOCYTES UA: NEGATIVE
Nitrite: NEGATIVE
PH: 6 (ref 5.0–8.0)
Protein, ur: NEGATIVE mg/dL
SPECIFIC GRAVITY, URINE: 1.02 (ref 1.005–1.030)
Urobilinogen, UA: 0.2 mg/dL (ref 0.0–1.0)

## 2015-02-12 LAB — COMPREHENSIVE METABOLIC PANEL
ALBUMIN: 4.7 g/dL (ref 3.5–5.0)
ALK PHOS: 63 U/L (ref 38–126)
ALT: 16 U/L (ref 14–54)
AST: 19 U/L (ref 15–41)
Anion gap: 7 (ref 5–15)
BILIRUBIN TOTAL: 0.7 mg/dL (ref 0.3–1.2)
BUN: 12 mg/dL (ref 6–20)
CALCIUM: 9.2 mg/dL (ref 8.9–10.3)
CO2: 26 mmol/L (ref 22–32)
CREATININE: 0.69 mg/dL (ref 0.44–1.00)
Chloride: 106 mmol/L (ref 101–111)
GFR calc Af Amer: >60 mL/min (ref >60)
GFR calc non Af Amer: >60 mL/min (ref >60)
GLUCOSE: 140 mg/dL — AB (ref 65–99)
Potassium: 4.2 mmol/L (ref 3.5–5.1)
SODIUM: 139 mmol/L (ref 135–145)
TOTAL PROTEIN: 7.5 g/dL (ref 6.5–8.1)

## 2015-02-12 LAB — CBC WITH DIFFERENTIAL/PLATELET
BASOS PCT: 0 % (ref 0–1)
Basophils Absolute: 0 10*3/uL (ref 0.0–0.1)
EOS ABS: 0 10*3/uL (ref 0.0–0.7)
Eosinophils Relative: 0 % (ref 0–5)
HCT: 45 % (ref 36.0–46.0)
HEMOGLOBIN: 15 g/dL (ref 12.0–15.0)
Lymphocytes Relative: 2 % — ABNORMAL LOW (ref 12–46)
Lymphs Abs: 0.3 10*3/uL — ABNORMAL LOW (ref 0.7–4.0)
MCH: 29.8 pg (ref 26.0–34.0)
MCHC: 33.3 g/dL (ref 30.0–36.0)
MCV: 89.3 fL (ref 78.0–100.0)
MONOS PCT: 2 % — AB (ref 3–12)
Monocytes Absolute: 0.2 10*3/uL (ref 0.1–1.0)
NEUTROS PCT: 96 % — AB (ref 43–77)
Neutro Abs: 11.4 10*3/uL — ABNORMAL HIGH (ref 1.7–7.7)
Platelets: 308 10*3/uL (ref 150–400)
RBC: 5.04 MIL/uL (ref 3.87–5.11)
RDW: 12.8 % (ref 11.5–15.5)
WBC: 11.9 10*3/uL — ABNORMAL HIGH (ref 4.0–10.5)

## 2015-02-12 LAB — LIPASE, BLOOD: Lipase: 17 U/L — ABNORMAL LOW (ref 22–51)

## 2015-02-12 LAB — TROPONIN I: Troponin I: <0.03 ng/mL (ref <0.031)

## 2015-02-12 MED ORDER — IOHEXOL 300 MG/ML  SOLN
100.0000 mL | Freq: Once | INTRAMUSCULAR | Status: AC | PRN
  Administered 2015-02-12: 100 mL via INTRAVENOUS

## 2015-02-12 MED ORDER — HYDROMORPHONE HCL 1 MG/ML IJ SOLN
1.0000 mg | Freq: Once | INTRAMUSCULAR | Status: AC
  Administered 2015-02-12: 1 mg via INTRAVENOUS
  Filled 2015-02-12: qty 1

## 2015-02-12 MED ORDER — DIPHENOXYLATE-ATROPINE 2.5-0.025 MG PO TABS
2.0000 | ORAL_TABLET | Freq: Once | ORAL | Status: AC
  Administered 2015-02-12: 2 via ORAL
  Filled 2015-02-12: qty 2

## 2015-02-12 MED ORDER — ONDANSETRON HCL 4 MG/2ML IJ SOLN
4.0000 mg | Freq: Once | INTRAMUSCULAR | Status: AC
  Administered 2015-02-12: 4 mg via INTRAVENOUS
  Filled 2015-02-12: qty 2

## 2015-02-12 MED ORDER — DIPHENOXYLATE-ATROPINE 2.5-0.025 MG PO TABS: 1.0000 | ORAL_TABLET | Freq: Four times a day (QID) | ORAL | Status: DC | PRN

## 2015-02-12 MED ORDER — PROMETHAZINE HCL 25 MG PO TABS: 25.0000 mg | ORAL_TABLET | Freq: Four times a day (QID) | ORAL | Status: DC | PRN

## 2015-02-12 MED ORDER — DICYCLOMINE HCL 20 MG PO TABS: 20.0000 mg | ORAL_TABLET | Freq: Two times a day (BID) | ORAL | Status: DC

## 2015-02-12 MED ORDER — SODIUM CHLORIDE 0.9 % IV BOLUS (SEPSIS)
1000.0000 mL | Freq: Once | INTRAVENOUS | Status: AC
  Administered 2015-02-12: 1000 mL via INTRAVENOUS

## 2015-02-12 NOTE — ED Notes (Signed)
Pt made aware to return if symptoms worsen or if any life threatening symptoms occur.   

## 2015-02-12 NOTE — Discharge Instructions (Signed)
Abdominal Pain  Many things can cause abdominal pain. Usually, abdominal pain is not caused by a disease and will improve without treatment. It can often be observed and treated at home. Your health care provider will do a physical exam and possibly order blood tests and X-rays to help determine the seriousness of your pain. However, in many cases, more time must pass before a clear cause of the pain can be found. Before that point, your health care provider may not know if you need more testing or further treatment.  HOME CARE INSTRUCTIONS   Monitor your abdominal pain for any changes. The following actions may help to alleviate any discomfort you are experiencing:   Only take over-the-counter or prescription medicines as directed by your health care provider.   Do not take laxatives unless directed to do so by your health care provider.   Try a clear liquid diet (broth, tea, or water) as directed by your health care provider. Slowly move to a bland diet as tolerated.  SEEK MEDICAL CARE IF:   You have unexplained abdominal pain.   You have abdominal pain associated with nausea or diarrhea.   You have pain when you urinate or have a bowel movement.   You experience abdominal pain that wakes you in the night.   You have abdominal pain that is worsened or improved by eating food.   You have abdominal pain that is worsened with eating fatty foods.   You have a fever.  SEEK IMMEDIATE MEDICAL CARE IF:    Your pain does not go away within 2 hours.   You keep throwing up (vomiting).   Your pain is felt only in portions of the abdomen, such as the right side or the left lower portion of the abdomen.   You pass bloody or black tarry stools.  MAKE SURE YOU:   Understand these instructions.    Will watch your condition.    Will get help right away if you are not doing well or get worse.   Document Released: 02/25/2005 Document Revised: 05/23/2013 Document Reviewed: 01/25/2013  Exit[employer] Patient Information  2015 [employer]. This information is not intended to replace advice given to you by your health care provider. Make sure you discuss any questions you have with your health care provider.  Diarrhea  Diarrhea is frequent loose and watery bowel movements. It can cause you to feel weak and dehydrated. Dehydration can cause you to become tired and thirsty, have a dry mouth, and have decreased urination that often is dark yellow. Diarrhea is a sign of another problem, most often an infection that will not last long. In most cases, diarrhea typically lasts 2-3 days. However, it can last longer if it is a sign of something more serious. It is important to treat your diarrhea as directed by your caregiver to lessen or prevent future episodes of diarrhea.  CAUSES   Some common causes include:   Gastrointestinal infections caused by viruses, bacteria, or parasites.   Food poisoning or food allergies.   Certain medicines, such as antibiotics, chemotherapy, and laxatives.   Artificial sweeteners and fructose.   Digestive disorders.  HOME CARE INSTRUCTIONS   Ensure adequate fluid intake (hydration): Have 1 cup (8 oz) of fluid for each diarrhea episode. Avoid fluids that contain simple sugars or sports drinks, fruit juices, whole milk products, and sodas. Your urine should be clear or pale yellow if you are drinking enough fluids. Hydrate with an oral rehydration solution that you   can purchase at pharmacies, retail stores, and online. You can prepare an oral rehydration solution at home by mixing the following ingredients together:    - tsp table salt.    tsp baking soda.    tsp salt substitute containing potassium chloride.   1  tablespoons sugar.   1 L (34 oz) of water.   Certain foods and beverages may increase the speed at which food moves through the gastrointestinal (GI) tract. These foods and beverages should be avoided and include:   Caffeinated and alcoholic beverages.   High-fiber foods, such as raw  fruits and vegetables, nuts, seeds, and whole grain breads and cereals.   Foods and beverages sweetened with sugar alcohols, such as xylitol, sorbitol, and mannitol.   Some foods may be well tolerated and may help thicken stool including:   Starchy foods, such as rice, toast, pasta, low-sugar cereal, oatmeal, grits, baked potatoes, crackers, and bagels.   Bananas.   Applesauce.   Add probiotic-rich foods to help increase healthy bacteria in the GI tract, such as yogurt and fermented milk products.   Wash your hands well after each diarrhea episode.   Only take over-the-counter or prescription medicines as directed by your caregiver.   Take a warm bath to relieve any burning or pain from frequent diarrhea episodes.  SEEK IMMEDIATE MEDICAL CARE IF:    You are unable to keep fluids down.   You have persistent vomiting.   You have blood in your stool, or your stools are black and tarry.   You do not urinate in 6-8 hours, or there is only a small amount of very dark urine.   You have abdominal pain that increases or localizes.   You have weakness, dizziness, confusion, or light-headedness.   You have a severe headache.   Your diarrhea gets worse or does not get better.   You have a fever or persistent symptoms for more than 2-3 days.   You have a fever and your symptoms suddenly get worse.  MAKE SURE YOU:    Understand these instructions.   Will watch your condition.   Will get help right away if you are not doing well or get worse.  Document Released: 05/08/2002 Document Revised: 10/02/2013 Document Reviewed: 01/24/2012  Exit[employer] Patient Information 2015 [employer]. This information is not intended to replace advice given to you by your health care provider. Make sure you discuss any questions you have with your health care provider.  Nausea and Vomiting  Nausea is a sick feeling that often comes before throwing up (vomiting). Vomiting is a reflex where stomach contents come out of your mouth.  Vomiting can cause severe loss of body fluids (dehydration). Children and elderly adults can become dehydrated quickly, especially if they also have diarrhea. Nausea and vomiting are symptoms of a condition or disease. It is important to find the cause of your symptoms.  CAUSES    Direct irritation of the stomach lining. This irritation can result from increased acid production (gastroesophageal reflux disease), infection, food poisoning, taking certain medicines (such as nonsteroidal anti-inflammatory drugs), alcohol use, or tobacco use.   Signals from the brain.These signals could be caused by a headache, heat exposure, an inner ear disturbance, increased pressure in the brain from injury, infection, a tumor, or a concussion, pain, emotional stimulus, or metabolic problems.   An obstruction in the gastrointestinal tract (bowel obstruction).   Illnesses such as diabetes, hepatitis, gallbladder problems, appendicitis, kidney problems, cancer, sepsis, atypical symptoms of a   heart attack, or eating disorders.   Medical treatments such as chemotherapy and radiation.   Receiving medicine that makes you sleep (general anesthetic) during surgery.  DIAGNOSIS  Your caregiver may ask for tests to be done if the problems do not improve after a few days. Tests may also be done if symptoms are severe or if the reason for the nausea and vomiting is not clear. Tests may include:   Urine tests.   Blood tests.   Stool tests.   Cultures (to look for evidence of infection).   X-rays or other imaging studies.  Test results can help your caregiver make decisions about treatment or the need for additional tests.  TREATMENT  You need to stay well hydrated. Drink frequently but in small amounts.You may wish to drink water, sports drinks, clear broth, or eat frozen ice pops or gelatin dessert to help stay hydrated.When you eat, eating slowly may help prevent nausea.There are also some antinausea medicines that may help  prevent nausea.  HOME CARE INSTRUCTIONS    Take all medicine as directed by your caregiver.   If you do not have an appetite, do not force yourself to eat. However, you must continue to drink fluids.   If you have an appetite, eat a normal diet unless your caregiver tells you differently.   Eat a variety of complex carbohydrates (rice, wheat, potatoes, bread), lean meats, yogurt, fruits, and vegetables.   Avoid high-fat foods because they are more difficult to digest.   Drink enough water and fluids to keep your urine clear or pale yellow.   If you are dehydrated, ask your caregiver for specific rehydration instructions. Signs of dehydration may include:   Severe thirst.   Dry lips and mouth.   Dizziness.   Dark urine.   Decreasing urine frequency and amount.   Confusion.   Rapid breathing or pulse.  SEEK IMMEDIATE MEDICAL CARE IF:    You have blood or brown flecks (like coffee grounds) in your vomit.   You have black or bloody stools.   You have a severe headache or stiff neck.   You are confused.   You have severe abdominal pain.   You have chest pain or trouble breathing.   You do not urinate at least once every 8 hours.   You develop cold or clammy skin.   You continue to vomit for longer than 24 to 48 hours.   You have a fever.  MAKE SURE YOU:    Understand these instructions.   Will watch your condition.   Will get help right away if you are not doing well or get worse.  Document Released: 05/18/2005 Document Revised: 08/10/2011 Document Reviewed: 10/15/2010  [employer] Patient Information 2015 [employer]. This information is not intended to replace advice given to you by your health care provider. Make sure you discuss any questions you have with your health care provider.

## 2015-02-12 NOTE — ED Provider Notes (Signed)
CSN: [id number]     Arrival date & time 02/12/15  1334 History   First MD Initiated Contact with Patient 02/12/15 1343     Chief Complaint  Patient presents with  . Abdominal Pain  . Chest Pain  . Diarrhea  . Emesis     (Consider location/radiation/quality/duration/timing/severity/associated sxs/prior Treatment) HPI Comments: Presents to the emergency room for evaluation of abdominal and chest pain. Patient reports that she was not feeling well yesterday, was experiencing a squeezing crampy pain in her epigastric region associated with nausea. After she ate dinner symptoms worsened. She has developed nausea, vomiting and diarrhea today. She reports that she has not been able to get off the toilet because of the amounts of diarrhea. No vomiting of blood or rectal bleeding. Pain is in the center of her upper abdomen, radiates to the chest and back now. No identified alleviating or exacerbating factors.  Patient is a 49 y.o. female presenting with abdominal pain, chest pain, diarrhea, and vomiting.  Abdominal Pain Associated symptoms: chest pain, diarrhea and vomiting   Chest Pain Associated symptoms: abdominal pain and vomiting   Diarrhea Associated symptoms: abdominal pain and vomiting   Emesis Associated symptoms: abdominal pain and diarrhea     Past Medical History  Diagnosis Date  . Depression   . ADD (attention deficit disorder)   . IFG (impaired fasting glucose)   . Hypertension   . COPD (chronic obstructive pulmonary disease)   . GERD (gastroesophageal reflux disease)    Past Surgical History  Procedure Laterality Date  . Tubal ligation    . Orthopedic surgery     History reviewed. No pertinent family history. Social History  Substance Use Topics  . Smoking status: Former Smoker    Quit date: 09/30/2010  . Smokeless tobacco: None  . Alcohol Use: No   OB History    No data available     Review of Systems  Cardiovascular: Positive for chest pain.   Gastrointestinal: Positive for vomiting, abdominal pain and diarrhea.  All other systems reviewed and are negative.     Allergies  Augmentin  Home Medications   Prior to Admission medications   Medication Sig Start Date End Date Taking? Authorizing Provider  albuterol (PROVENTIL HFA;VENTOLIN HFA) 108 (90 BASE) MCG/ACT inhaler Inhale 2 puffs into the lungs every 6 (six) hours as needed for wheezing or shortness of breath. 10/04/14  Yes Kathyrn Drown, MD  ibuprofen (ADVIL,MOTRIN) 200 MG tablet Take 200 mg by mouth every 6 (six) hours as needed. For pain   Yes Historical Provider, MD  ketotifen (ALLERGY EYE DROPS) 0.025 % ophthalmic solution Place 1 drop into both eyes every morning.   Yes Historical Provider, MD  loratadine (CLARITIN) 10 MG tablet Take 10 mg by mouth every morning.   Yes Historical Provider, MD  phentermine 37.5 MG capsule Take 1 capsule (37.5 mg total) by mouth every morning. 10/04/14  Yes Kathyrn Drown, MD  tiotropium (SPIRIVA HANDIHALER) 18 MCG inhalation capsule INHALE THE CONTENTS OF 1 CAPSULE INTO THE LUNGS EVERY DAY 10/04/14  Yes Kathyrn Drown, MD   BP 132/84 mmHg  Pulse 107  Temp(Src) 98.1 F (36.7 C) (Oral)  Resp 18  Ht 5\' 2"  (1.575 m)  Wt 220 lb (99.791 kg)  BMI 40.23 kg/m2  SpO2 96% Physical Exam  Constitutional: She is oriented to person, place, and time. She appears well-developed and well-nourished. No distress.  HENT:  Head: Normocephalic and atraumatic.  Right Ear: Hearing normal.  Left  Ear: Hearing normal.  Nose: Nose normal.  Mouth/Throat: Oropharynx is clear and moist and mucous membranes are normal.  Eyes: Conjunctivae and EOM are normal. Pupils are equal, round, and reactive to light.  Neck: Normal range of motion. Neck supple.  Cardiovascular: Regular rhythm, S1 normal and S2 normal.  Exam reveals no gallop and no friction rub.   No murmur heard. Pulmonary/Chest: Effort normal and breath sounds normal. No respiratory distress. She exhibits  no tenderness.  Abdominal: Soft. Normal appearance and bowel sounds are normal. There is no hepatosplenomegaly. There is no tenderness. There is no rebound, no guarding, no tenderness at McBurney's point and negative Murphy's sign. No hernia.  Musculoskeletal: Normal range of motion.  Neurological: She is alert and oriented to person, place, and time. She has normal strength. No cranial nerve deficit or sensory deficit. Coordination normal. GCS eye subscore is 4. GCS verbal subscore is 5. GCS motor subscore is 6.  Skin: Skin is warm, dry and intact. No rash noted. No cyanosis.  Psychiatric: She has a normal mood and affect. Her speech is normal and behavior is normal. Thought content normal.  Nursing note and vitals reviewed.   ED Course  Procedures (including critical care time) Labs Review Labs Reviewed  CBC WITH DIFFERENTIAL/PLATELET - Abnormal; Notable for the following:    WBC 11.9 (*)    Neutrophils Relative % 96 (*)    Neutro Abs 11.4 (*)    Lymphocytes Relative 2 (*)    Lymphs Abs 0.3 (*)    Monocytes Relative 2 (*)    All other components within normal limits  COMPREHENSIVE METABOLIC PANEL - Abnormal; Notable for the following:    Glucose, Bld 140 (*)    All other components within normal limits  LIPASE, BLOOD - Abnormal; Notable for the following:    Lipase 17 (*)    All other components within normal limits  TROPONIN I  URINALYSIS, ROUTINE W REFLEX MICROSCOPIC (NOT AT [facility])    Imaging Review Ct Abdomen Pelvis W Contrast  02/12/2015   CLINICAL DATA:  Patient with chest pain radiating to the back. Abdominal pain and nausea for 2 hours.  EXAM: CT ABDOMEN AND PELVIS WITH CONTRAST  TECHNIQUE: Multidetector CT imaging of the abdomen and pelvis was performed using the standard protocol following bolus administration of intravenous contrast.  CONTRAST:  16mL OMNIPAQUE IOHEXOL 300 MG/ML  SOLN  COMPARISON:  Abdominal radiographs 02/12/2015  FINDINGS: Lower chest: Normal heart size.  Dependent atelectasis within the bilateral lower lobes. No pleural effusion.  Hepatobiliary: Liver is normal in size and contour. No focal hepatic lesion identified. Gallbladder is unremarkable. No intrahepatic or extrahepatic biliary duct dilatation.  Pancreas: Unremarkable  Spleen: Unremarkable  Adrenals/Urinary Tract: The adrenal glands are normal. Kidneys enhance symmetrically with contrast. No hydronephrosis. Urinary bladder is unremarkable.  Stomach/Bowel: No abnormal bowel wall thickening or evidence for bowel obstruction. The appendix is normal. No free fluid or free intraperitoneal air. Stomach is unremarkable.  Vascular/Lymphatic: Normal caliber abdominal aorta. Peripheral calcified atherosclerotic plaque. No retroperitoneal lymphadenopathy.  Other: Uterus and ovaries are unremarkable.  Musculoskeletal: No aggressive or acute appearing osseous lesions. Lumbar spine degenerative changes.  IMPRESSION: No acute process within the abdomen or pelvis.   Electronically Signed   By: Lovey Newcomer M.D.   On: 02/12/2015 17:13   Dg Abd Acute W/chest  02/12/2015   CLINICAL DATA:  Chest pain since 5 a.m. radiating to back  EXAM: DG ABDOMEN ACUTE W/ 1V CHEST  COMPARISON:  03/28/2012  FINDINGS: Heart size and vascular pattern normal. Lungs clear except for minimal subsegmental atelectasis right lower lobe. No free air. No abnormally dilated loops of bowel. Multiple air-fluid levels scattered throughout small bowel as well as ascending and transverse colon.  IMPRESSION: Findings suggest enterocolitis.   Electronically Signed   By: Skipper Cliche M.D.   On: 02/12/2015 15:25   I have personally reviewed and evaluated these images and lab results as part of my medical decision-making.   EKG Interpretation   Date/Time:  Tuesday February 12 2015 13:49:07 EDT Ventricular Rate:  104 PR Interval:  121 QRS Duration: 88 QT Interval:  364 QTC Calculation: 479 R Axis:   53 Text Interpretation:  Sinus tachycardia  Probable left atrial enlargement  Nonspecific repol abnormality, diffuse leads No previous tracing Confirmed  by Manaia Samad  MD, Shirelle Tootle [phone]) on 02/12/2015 1:50:57 PM      MDM   Final diagnoses:  None   vomiting Diarrhea Abdominal pain  Patient presents to the ER for evaluation of nausea, vomiting, diarrhea with abdominal pain. Pain is predominantly epigastric, but does radiate to the chest and lower abdomen at times, as well as back. Patient hydrated and treated symptomatically. She was also provided analgesia. X-rays showed multiple air-fluid levels, this was felt to be most consistent with enterocolitis, no significant dilatation was noted. CT scan was evaluated, no evidence of obstruction, inflammatory or infectious process. Patient reassured, will be discharged. She will continue on symptomatic treatment for nausea, vomiting, diarrhea and abdominal pain. Follow-up with PCP.    Orpah Greek, MD 02/12/15 [phone]

## 2015-02-12 NOTE — ED Notes (Signed)
Pt. returned from XR. 

## 2015-02-12 NOTE — ED Notes (Signed)
Requested pain and nausea medication for pt

## 2015-02-12 NOTE — ED Notes (Signed)
Having chest pain since 5 am.  Radiating to back.  Rates pain 2/10.  Given Zofran via EMS.  Pt has @ 20 g jelco to let AC.

## 2015-10-27 ENCOUNTER — Other Ambulatory Visit: Payer: Self-pay | Admitting: Family Medicine

## 2015-12-03 ENCOUNTER — Other Ambulatory Visit: Payer: Self-pay | Admitting: Family Medicine

## 2015-12-18 ENCOUNTER — Encounter: Payer: Self-pay | Admitting: Family Medicine

## 2015-12-18 ENCOUNTER — Encounter (HOSPITAL_COMMUNITY): Payer: Self-pay | Admitting: Emergency Medicine

## 2015-12-18 ENCOUNTER — Telehealth: Payer: Self-pay

## 2015-12-18 ENCOUNTER — Emergency Department (HOSPITAL_COMMUNITY)
Admission: EM | Admit: 2015-12-18 | Discharge: 2015-12-18 | Disposition: A | Discharge status: Home / Self Care | Payer: Federal, State, Local not specified - PPO | Attending: Emergency Medicine | Admitting: Emergency Medicine

## 2015-12-18 DIAGNOSIS — Z79899 Other long term (current) drug therapy: Secondary | ICD-10-CM | POA: Insufficient documentation

## 2015-12-18 DIAGNOSIS — F329 Major depressive disorder, single episode, unspecified: Secondary | ICD-10-CM

## 2015-12-18 DIAGNOSIS — F32A Depression, unspecified: Secondary | ICD-10-CM

## 2015-12-18 DIAGNOSIS — Z046 Encounter for general psychiatric examination, requested by authority: Secondary | ICD-10-CM | POA: Diagnosis present

## 2015-12-18 DIAGNOSIS — I10 Essential (primary) hypertension: Secondary | ICD-10-CM | POA: Diagnosis not present

## 2015-12-18 DIAGNOSIS — F419 Anxiety disorder, unspecified: Secondary | ICD-10-CM

## 2015-12-18 DIAGNOSIS — J449 Chronic obstructive pulmonary disease, unspecified: Secondary | ICD-10-CM | POA: Insufficient documentation

## 2015-12-18 DIAGNOSIS — F418 Other specified anxiety disorders: Secondary | ICD-10-CM | POA: Diagnosis not present

## 2015-12-18 DIAGNOSIS — F17200 Nicotine dependence, unspecified, uncomplicated: Secondary | ICD-10-CM | POA: Insufficient documentation

## 2015-12-18 DIAGNOSIS — Z791 Long term (current) use of non-steroidal anti-inflammatories (NSAID): Secondary | ICD-10-CM | POA: Diagnosis not present

## 2015-12-18 HISTORY — DX: Other psychoactive substance abuse, uncomplicated: F19.10

## 2015-12-18 LAB — CBC
HEMATOCRIT: 37.9 % (ref 36.0–46.0)
HEMOGLOBIN: 12.5 g/dL (ref 12.0–15.0)
MCH: 29.2 pg (ref 26.0–34.0)
MCHC: 33 g/dL (ref 30.0–36.0)
MCV: 88.6 fL (ref 78.0–100.0)
Platelets: 303 10*3/uL (ref 150–400)
RBC: 4.28 MIL/uL (ref 3.87–5.11)
RDW: 13.7 % (ref 11.5–15.5)
WBC: 7.1 10*3/uL (ref 4.0–10.5)

## 2015-12-18 LAB — ETHANOL: Alcohol, Ethyl (B): <5 mg/dL (ref <5)

## 2015-12-18 LAB — BASIC METABOLIC PANEL
ANION GAP: 5 (ref 5–15)
BUN: 11 mg/dL (ref 6–20)
CHLORIDE: 110 mmol/L (ref 101–111)
CO2: 24 mmol/L (ref 22–32)
Calcium: 8.6 mg/dL — ABNORMAL LOW (ref 8.9–10.3)
Creatinine, Ser: 0.6 mg/dL (ref 0.44–1.00)
GFR calc Af Amer: >60 mL/min (ref >60)
GLUCOSE: 89 mg/dL (ref 65–99)
POTASSIUM: 3.4 mmol/L — AB (ref 3.5–5.1)
Sodium: 139 mmol/L (ref 135–145)

## 2015-12-18 LAB — RAPID URINE DRUG SCREEN, HOSP PERFORMED
Amphetamines: NOT DETECTED
BARBITURATES: NOT DETECTED
BENZODIAZEPINES: NOT DETECTED
COCAINE: NOT DETECTED
OPIATES: NOT DETECTED
Tetrahydrocannabinol: NOT DETECTED

## 2015-12-18 NOTE — Discharge Instructions (Signed)
Take your usual prescriptions as previously directed.  Call your regular medical doctor tomorrow to schedule a follow up appointment within the next 2 days. Call the mental health resources given to you today to schedule a follow up appointment within the next week.  Return to the Emergency Department immediately sooner if worsening.

## 2015-12-18 NOTE — ED Notes (Addendum)
Patient states "I'm stressed out at work and I'm tired of the bullshit. I called Dr Wolfgang Phoenix and he couldn't get me in for an appointment and he asked me if I thought about hurting myself and I said 'no.' Then he asked me if I thought about hurting someone else and I told him 'yes' because I do. It's been going on for 2 years and I got help for this before. I've tried everything else I know to do but I'm not in their click and they treat me differently." Patient denies plan. Patient tearful at triage. Patient states she works at the [employer].
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
## 2015-12-18 NOTE — Telephone Encounter (Signed)
Please give patient a work excuse for the rest of the week. Per Dr. Nicki Reaper

## 2015-12-18 NOTE — ED Notes (Signed)
Pt given dinner meal tray

## 2015-12-18 NOTE — Telephone Encounter (Signed)
Work note done, up front for pick up
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
## 2015-12-18 NOTE — ED Provider Notes (Signed)
CSN: [id number]     Arrival date & time 12/18/15  1206 History   First MD Initiated Contact with Patient 12/18/15 1234     Chief Complaint  Patient presents with  . V70.1     HPI  Pt was seen at 1240.  Per pt, c/o gradual onset and worsening of persistent depression and anxiety for the past several years, worse over the past day. Pt states she has had ongoing issues with anxiety, depression and "stress at work" for the past 2 years. Pt states it "just go worse" today. Pt denies SI, but states she "has thoughts of hurting someone else." States she has been seen by a mental health provider for her symptoms "and it's not working." Pt called her PMD to be seen today, and was sent to the ED for mental health evaluation. Denies SA, no hallucinations.   Past Medical History  Diagnosis Date  . Depression   . ADD (attention deficit disorder)   . IFG (impaired fasting glucose)   . Hypertension   . COPD (chronic obstructive pulmonary disease) ([place])   . GERD (gastroesophageal reflux disease)   . Substance abuse    Past Surgical History  Procedure Laterality Date  . Tubal ligation    . Orthopedic surgery      Social History  Substance Use Topics  . Smoking status: Current Every Day Smoker    Last Attempt to Quit: 09/30/2010  . Smokeless tobacco: None  . Alcohol Use: No    Review of Systems ROS: Statement: All systems negative except as marked or noted in the HPI; Constitutional: Negative for fever and chills. ; ; Eyes: Negative for eye pain, redness and discharge. ; ; ENMT: Negative for ear pain, hoarseness, nasal congestion, sinus pressure and sore throat. ; ; Cardiovascular: Negative for chest pain, palpitations, diaphoresis, dyspnea and peripheral edema. ; ; Respiratory: Negative for cough, wheezing and stridor. ; ; Gastrointestinal: Negative for nausea, vomiting, diarrhea, abdominal pain, blood in stool, hematemesis, jaundice and rectal bleeding. . ; ; Genitourinary: Negative for dysuria,  flank pain and hematuria. ; ; Musculoskeletal: Negative for back pain and neck pain. Negative for swelling and trauma.; ; Skin: Negative for pruritus, rash, abrasions, blisters, bruising and skin lesion.; ; Neuro: Negative for headache, lightheadedness and neck stiffness. Negative for weakness, altered level of consciousness, altered mental status, extremity weakness, paresthesias, involuntary movement, seizure and syncope.; Psych:  No SI, no SA, no hallucinations.     Allergies  Augmentin  Home Medications   Prior to Admission medications   Medication Sig Start Date End Date Taking? Authorizing Provider  dicyclomine (BENTYL) 20 MG tablet Take 1 tablet (20 mg total) by mouth 2 (two) times daily. 02/12/15   Orpah Greek, MD  diphenoxylate-atropine (LOMOTIL) 2.5-0.025 MG per tablet Take 1-2 tablets by mouth 4 (four) times daily as needed for diarrhea or loose stools. 02/12/15   Orpah Greek, MD  ibuprofen (ADVIL,MOTRIN) 200 MG tablet Take 200 mg by mouth every 6 (six) hours as needed. For pain    Historical Provider, MD  ketotifen (ALLERGY EYE DROPS) 0.025 % ophthalmic solution Place 1 drop into both eyes every morning.    Historical Provider, MD  loratadine (CLARITIN) 10 MG tablet Take 10 mg by mouth every morning.    Historical Provider, MD  phentermine 37.5 MG capsule Take 1 capsule (37.5 mg total) by mouth every morning. 10/04/14   Kathyrn Drown, MD  promethazine (PHENERGAN) 25 MG tablet Take 1 tablet (25  mg total) by mouth every 6 (six) hours as needed for nausea or vomiting. 02/12/15   Orpah Greek, MD  SPIRIVA HANDIHALER 18 MCG inhalation capsule INHALE THE CONTENTS OF 1 CAPSULE VIA HANDIHALER EVERY DAY 12/04/15   Kathyrn Drown, MD  VENTOLIN HFA 108 (90 Base) MCG/ACT inhaler INHALE 2 PUFFS BY MOUTH EVERY 6 HOURS AS NEEDED FOR WHEEZING OR SHORTNESS OF BREATH 12/04/15   Kathyrn Drown, MD   BP 160/85 mmHg  Pulse 101  Temp(Src) 99 F (37.2 C)  Resp 18  Ht 5\' 3"  (1.6 m)   Wt 223 lb (101.152 kg)  BMI 39.51 kg/m2  SpO2 98% Physical Exam  1245: Physical examination:  Nursing notes reviewed; Vital signs and O2 SAT reviewed;  Constitutional: Well developed, Well nourished, Well hydrated, In no acute distress; Head:  Normocephalic, atraumatic; Eyes: EOMI, PERRL, No scleral icterus; ENMT: Mouth and pharynx normal, Mucous membranes moist; Neck: Supple, Full range of motion; Cardiovascular: Regular rate and rhythm; Respiratory: Breath sounds clear, No wheezes.  Speaking full sentences with ease, Normal respiratory effort/excursion; Chest: No deformity, Movement normal; Abdomen: Nondistended; Extremities: No deformity.; Neuro: AA&Ox3, Major CN grossly intact.  Speech clear. No gross focal motor deficits in extremities. Climbs on and off stretcher easily by herself. Gait steady.; Skin: Color normal, Warm, Dry.; Psych:  Tearful at times.     ED Course  Procedures (including critical care time) Labs Review  Imaging Review  I have personally reviewed and evaluated these images and lab results as part of my medical decision-making.   EKG Interpretation None      MDM  MDM Reviewed: previous chart, nursing note and vitals Reviewed previous: labs Interpretation: labs     Results for orders placed or performed during the hospital encounter of 12/18/15  Rapid urine drug screen (hospital performed)  Result Value Ref Range   Opiates NONE DETECTED NONE DETECTED   Cocaine NONE DETECTED NONE DETECTED   Benzodiazepines NONE DETECTED NONE DETECTED   Amphetamines NONE DETECTED NONE DETECTED   Tetrahydrocannabinol NONE DETECTED NONE DETECTED   Barbiturates NONE DETECTED NONE DETECTED  Basic metabolic panel  Result Value Ref Range   Sodium 139 135 - 145 mmol/L   Potassium 3.4 (L) 3.5 - 5.1 mmol/L   Chloride 110 101 - 111 mmol/L   CO2 24 22 - 32 mmol/L   Glucose, Bld 89 65 - 99 mg/dL   BUN 11 6 - 20 mg/dL   Creatinine, Ser 0.60 0.44 - 1.00 mg/dL   Calcium 8.6 (L)  8.9 - 10.3 mg/dL   GFR calc non Af Amer >60 >60 mL/min   GFR calc Af Amer >60 >60 mL/min   Anion gap 5 5 - 15  Ethanol  Result Value Ref Range   Alcohol, Ethyl (B) <5 <5 mg/dL  CBC  Result Value Ref Range   WBC 7.1 4.0 - 10.5 K/uL   RBC 4.28 3.87 - 5.11 MIL/uL   Hemoglobin 12.5 12.0 - 15.0 g/dL   HCT 37.9 36.0 - 46.0 %   MCV 88.6 78.0 - 100.0 fL   MCH 29.2 26.0 - 34.0 pg   MCHC 33.0 30.0 - 36.0 g/dL   RDW 13.7 11.5 - 15.5 %   Platelets 303 150 - 400 K/uL    1425:  TTS eval pending.   1820:  TTS has evaluated pt: states pt does not meet inpt criteria at this time and can be discharged with outpatient resources. Will d/c pt stable.   Nunzio Cory  Thurnell Garbe, DO 12/21/15 2133

## 2015-12-20 ENCOUNTER — Encounter: Payer: Self-pay | Admitting: Family Medicine

## 2015-12-20 ENCOUNTER — Ambulatory Visit (INDEPENDENT_AMBULATORY_CARE_PROVIDER_SITE_OTHER): Payer: Federal, State, Local not specified - PPO | Admitting: Family Medicine

## 2015-12-20 VITALS — BP 128/88 | Ht 63.0 in | Wt 224.0 lb

## 2015-12-20 DIAGNOSIS — Z658 Other specified problems related to psychosocial circumstances: Secondary | ICD-10-CM | POA: Diagnosis not present

## 2015-12-20 DIAGNOSIS — F439 Reaction to severe stress, unspecified: Secondary | ICD-10-CM

## 2015-12-20 DIAGNOSIS — R454 Irritability and anger: Secondary | ICD-10-CM

## 2015-12-20 DIAGNOSIS — J439 Emphysema, unspecified: Secondary | ICD-10-CM | POA: Diagnosis not present

## 2015-12-20 MED ORDER — TIOTROPIUM BROMIDE MONOHYDRATE 18 MCG IN CAPS: ORAL_CAPSULE | RESPIRATORY_TRACT | Status: DC

## 2015-12-20 MED ORDER — ALBUTEROL SULFATE HFA 108 (90 BASE) MCG/ACT IN AERS: INHALATION_SPRAY | RESPIRATORY_TRACT | Status: DC

## 2015-12-20 MED ORDER — IBUPROFEN 800 MG PO TABS: 800.0000 mg | ORAL_TABLET | Freq: Three times a day (TID) | ORAL | Status: DC | PRN

## 2015-12-20 MED ORDER — ALPRAZOLAM 0.5 MG PO TABS: 0.5000 mg | ORAL_TABLET | Freq: Two times a day (BID) | ORAL | Status: DC | PRN

## 2015-12-20 NOTE — Progress Notes (Signed)
   Subjective:    Patient ID: [name], female    DOB: [birth year], 50 y.o.   MRN: [id number]  HPIFollow up ED visit for anxiety and depression. Went to [facility] ed on 7/19. Patient has been feeling very stressed out recently at times feeling angry and on edge with quick temper she understands she needs to get that under better control she denies being suicidal or homicidal Needs refills on spiriva and ventolin.  Patient has history of COPD she does smoke she know she needs quit  Knot on right side of foot. Just noticied it. Not painful.   it does not cause any pain does not swell nontender   Review of Systems     Denies fever or hemoptysis weight loss Objective:   Physical Exam   On examination there is a protruding runs on the right side of the foot no sign of any type cyst.  Lungs clear heart regular      Assessment & Plan:   COPD stable encouraged to stay away from smoking refills given  Patient denies being depressed currently but states she's been stressed at times she has a hard time controlling anger we had a long discussion she denies being suicidal or homicidal. She states she is going to [facility] on Monday for further evaluation  I did inform the patient is best to try to minimize her stress and use self relaxation techniques. Also told the patient very important to go on Monday for further evaluation and possible medications that that will be up to the specialists  Her foot I believe is a normal variant I would not recommend any type of x-rays

## 2015-12-23 DIAGNOSIS — F331 Major depressive disorder, recurrent, moderate: Secondary | ICD-10-CM | POA: Diagnosis not present

## 2015-12-30 DIAGNOSIS — F331 Major depressive disorder, recurrent, moderate: Secondary | ICD-10-CM | POA: Diagnosis not present

## 2016-01-14 DIAGNOSIS — F331 Major depressive disorder, recurrent, moderate: Secondary | ICD-10-CM | POA: Diagnosis not present

## 2016-01-21 DIAGNOSIS — L03119 Cellulitis of unspecified part of limb: Secondary | ICD-10-CM | POA: Diagnosis not present

## 2016-01-29 DIAGNOSIS — F331 Major depressive disorder, recurrent, moderate: Secondary | ICD-10-CM | POA: Diagnosis not present

## 2016-02-18 DIAGNOSIS — F331 Major depressive disorder, recurrent, moderate: Secondary | ICD-10-CM | POA: Diagnosis not present

## 2016-02-19 DIAGNOSIS — F331 Major depressive disorder, recurrent, moderate: Secondary | ICD-10-CM | POA: Diagnosis not present

## 2016-03-17 ENCOUNTER — Encounter (HOSPITAL_COMMUNITY): Payer: Self-pay | Admitting: Emergency Medicine

## 2016-03-17 ENCOUNTER — Emergency Department (HOSPITAL_COMMUNITY)
Admission: EM | Admit: 2016-03-17 | Discharge: 2016-03-17 | Disposition: A | Discharge status: Home / Self Care | Payer: Federal, State, Local not specified - PPO | Attending: Emergency Medicine | Admitting: Emergency Medicine

## 2016-03-17 DIAGNOSIS — Z79899 Other long term (current) drug therapy: Secondary | ICD-10-CM | POA: Insufficient documentation

## 2016-03-17 DIAGNOSIS — T7840XA Allergy, unspecified, initial encounter: Secondary | ICD-10-CM

## 2016-03-17 DIAGNOSIS — I10 Essential (primary) hypertension: Secondary | ICD-10-CM | POA: Insufficient documentation

## 2016-03-17 DIAGNOSIS — J449 Chronic obstructive pulmonary disease, unspecified: Secondary | ICD-10-CM | POA: Insufficient documentation

## 2016-03-17 DIAGNOSIS — F909 Attention-deficit hyperactivity disorder, unspecified type: Secondary | ICD-10-CM | POA: Insufficient documentation

## 2016-03-17 DIAGNOSIS — F331 Major depressive disorder, recurrent, moderate: Secondary | ICD-10-CM | POA: Diagnosis not present

## 2016-03-17 DIAGNOSIS — Z791 Long term (current) use of non-steroidal anti-inflammatories (NSAID): Secondary | ICD-10-CM | POA: Insufficient documentation

## 2016-03-17 DIAGNOSIS — F17210 Nicotine dependence, cigarettes, uncomplicated: Secondary | ICD-10-CM | POA: Insufficient documentation

## 2016-03-17 DIAGNOSIS — R21 Rash and other nonspecific skin eruption: Secondary | ICD-10-CM | POA: Diagnosis present

## 2016-03-17 HISTORY — DX: Bipolar disorder, unspecified: F31.9

## 2016-03-17 MED ORDER — DIPHENHYDRAMINE HCL 50 MG/ML IJ SOLN
25.0000 mg | Freq: Once | INTRAMUSCULAR | Status: AC
  Administered 2016-03-17: 25 mg via INTRAVENOUS
  Filled 2016-03-17: qty 1

## 2016-03-17 MED ORDER — PREDNISONE 50 MG PO TABS: ORAL_TABLET | ORAL | 0 refills | Status: DC

## 2016-03-17 MED ORDER — FAMOTIDINE IN NACL 20-0.9 MG/50ML-% IV SOLN
20.0000 mg | Freq: Once | INTRAVENOUS | Status: AC
  Administered 2016-03-17: 20 mg via INTRAVENOUS
  Filled 2016-03-17: qty 50

## 2016-03-17 MED ORDER — SODIUM CHLORIDE 0.9 % IV BOLUS (SEPSIS)
500.0000 mL | Freq: Once | INTRAVENOUS | Status: AC
  Administered 2016-03-17: 500 mL via INTRAVENOUS

## 2016-03-17 MED ORDER — METHYLPREDNISOLONE SODIUM SUCC 125 MG IJ SOLR
125.0000 mg | Freq: Once | INTRAMUSCULAR | Status: AC
  Administered 2016-03-17: 125 mg via INTRAVENOUS
  Filled 2016-03-17: qty 2

## 2016-03-17 NOTE — ED Provider Notes (Signed)
[facility] DEPT Provider Note   CSN: [id number] Arrival date & time: 03/17/16  [id number]  By signing my name below, I, Jeanell Sparrow, attest that this documentation has been prepared under the direction and in the presence of Nat Christen, MD . Electronically Signed: Jeanell Sparrow, Scribe. 03/17/2016. 8:11 PM.  History   Chief Complaint Chief Complaint  Patient presents with  . Allergic Reaction   The history is provided by the patient. No language interpreter was used.   HPI Comments: [name] is a 50 y.o. female who presents to the Emergency Department complaining of a rash that started this morning. She reports waking up at 5am today with pruritis, which is atypical for her. She took Claritin at home with minimal relief. She denies any known exposures, allergens, dyspnea, or trouble swallowing.   Past Medical History:  Diagnosis Date  . ADD (attention deficit disorder)   . Bipolar 1 disorder ([place])   . COPD (chronic obstructive pulmonary disease) ([place])   . Depression   . GERD (gastroesophageal reflux disease)   . Hypertension   . IFG (impaired fasting glucose)   . Substance abuse     Patient Active Problem List   Diagnosis Date Noted  . COPD (chronic obstructive pulmonary disease) with emphysema ([place]) 12/20/2015  . CARPAL TUNNEL SYNDROME 04/16/2008  . NUMBNESS/TINGLING 04/16/2008    Past Surgical History:  Procedure Laterality Date  . ORTHOPEDIC SURGERY    . TUBAL LIGATION      OB History    Gravida Para Term Preterm AB Living   2 2 2          SAB TAB Ectopic Multiple Live Births                   Home Medications    Prior to Admission medications   Medication Sig Start Date End Date Taking? Authorizing Provider  albuterol (VENTOLIN HFA) 108 (90 Base) MCG/ACT inhaler INHALE 2 PUFFS BY MOUTH EVERY 6 HOURS AS NEEDED FOR WHEEZING OR SHORTNESS OF BREATH 12/20/15  Yes Kathyrn Drown, MD  clonazePAM (KLONOPIN) 0.5 MG tablet Take 1 tablet by mouth at  bedtime. 03/16/16  Yes Historical Provider, MD  gabapentin (NEURONTIN) 300 MG capsule Take 300 mg by mouth at bedtime. 03/15/16  Yes Historical Provider, MD  ibuprofen (ADVIL,MOTRIN) 200 MG tablet Take 800 mg by mouth every 6 (six) hours as needed for headache or moderate pain. For pain    Yes Historical Provider, MD  loratadine (CLARITIN) 10 MG tablet Take 10 mg by mouth every morning.   Yes Historical Provider, MD  tiotropium (SPIRIVA HANDIHALER) 18 MCG inhalation capsule INHALE THE CONTENTS OF 1 CAPSULE VIA HANDIHALER EVERY DAY 12/20/15  Yes Kathyrn Drown, MD  predniSONE (DELTASONE) 50 MG tablet 1 tablet daily for 7 days 03/17/16   Nat Christen, MD    Family History Family History  Problem Relation Age of Onset  . Atrial fibrillation Mother     Social History Social History  Substance Use Topics  . Smoking status: Current Every Day Smoker    Packs/day: 1.00    Types: Cigarettes    Last attempt to quit: 09/30/2010  . Smokeless tobacco: Never Used  . Alcohol use No     Allergies   Augmentin [amoxicillin-pot clavulanate]   Review of Systems Review of Systems A complete 10 system review of systems was obtained and all systems are negative except as noted in the HPI and PMH.   Physical Exam Updated Vital  Signs BP 168/78 (BP Location: Left Arm)   Pulse 80   Temp 97.9 F (36.6 C) (Oral)   Resp 16   Ht 5\' 3"  (1.6 m)   Wt 230 lb (104.3 kg)   SpO2 99%   BMI 40.74 kg/m   Physical Exam  Constitutional: She is oriented to person, place, and time. She appears well-developed and well-nourished.  HENT:  Head: Normocephalic and atraumatic.  Eyes: Conjunctivae are normal.  Neck: Neck supple.  Cardiovascular: Normal rate and regular rhythm.   Pulmonary/Chest: Effort normal and breath sounds normal.  Abdominal: Soft. Bowel sounds are normal.  Musculoskeletal: Normal range of motion.  Neurological: She is alert and oriented to person, place, and time.  Skin: Skin is warm and dry.    Diffuse wheals on chest, abdomen, and upper extremities.    Psychiatric: She has a normal mood and affect. Her behavior is normal.  Nursing note and vitals reviewed.    ED Treatments / Results  DIAGNOSTIC STUDIES: Oxygen Saturation is 99% on RA, normal by my interpretation.    COORDINATION OF CARE: 8:15 PM- Pt advised of plan for treatment, which includes IV solumedrol, pepcid, and benadryl. She will also be discharged with prednisone. Pt agrees to treatment plan.  Labs (all labs ordered are listed, but only abnormal results are displayed) Labs Reviewed - No data to display  EKG  EKG Interpretation None       Radiology No results found.  Procedures Procedures (including critical care time)  Medications Ordered in ED Medications  sodium chloride 0.9 % bolus 500 mL (0 mLs Intravenous Stopped 03/17/16 2113)  methylPREDNISolone sodium succinate (SOLU-MEDROL) 125 mg/2 mL injection 125 mg (125 mg Intravenous Given 03/17/16 2000)  famotidine (PEPCID) IVPB 20 mg premix (0 mg Intravenous Stopped 03/17/16 2113)  diphenhydrAMINE (BENADRYL) injection 25 mg (25 mg Intravenous Given 03/17/16 2000)     Initial Impression / Assessment and Plan / ED Course  I have reviewed the triage vital signs and the nursing notes.  Pertinent labs & imaging results that were available during my care of the patient were reviewed by me and considered in my medical decision making (see chart for details).  Clinical Course    Patient presents with allergic reaction of uncertain etiology. She responded well to IV Solu-Medrol, IV Pepcid, IV Benadryl. Will discharge home on prednisone. Encouraged to take Claritin.  Final Clinical Impressions(s) / ED Diagnoses   Final diagnoses:  Allergic reaction, initial encounter    New Prescriptions New Prescriptions   PREDNISONE (DELTASONE) 50 MG TABLET    1 tablet daily for 7 days   I personally performed the services described in this documentation,  which was scribed in my presence. The recorded information has been reviewed and is accurate.      Nat Christen, MD 03/17/16 2142

## 2016-03-17 NOTE — Discharge Instructions (Signed)
Prednisone 1 tablet daily for 7 days. Take daily Claritin. Follow-up with your primary care doctor. If symptoms persists, you may need to see an allergist.

## 2016-03-17 NOTE — ED Notes (Signed)
Rash looks less red and raised.  Pt reports she is still itching

## 2016-03-17 NOTE — ED Notes (Signed)
Pt states her itching has decreased

## 2016-03-17 NOTE — ED Triage Notes (Signed)
Pt reports she hasn't felt good over the weekend. Pt reports starting to break out this am. Pt now with generalized rash areas. Pt states she took 3 loratadine pills at approx 1600. Pt now beginning to break out on her face.

## 2016-03-18 ENCOUNTER — Encounter (HOSPITAL_COMMUNITY): Payer: Self-pay

## 2016-03-18 ENCOUNTER — Emergency Department (HOSPITAL_COMMUNITY)
Admission: EM | Admit: 2016-03-18 | Discharge: 2016-03-18 | Disposition: A | Discharge status: Home / Self Care | Payer: Federal, State, Local not specified - PPO | Attending: Emergency Medicine | Admitting: Emergency Medicine

## 2016-03-18 ENCOUNTER — Emergency Department (HOSPITAL_COMMUNITY): Payer: Federal, State, Local not specified - PPO

## 2016-03-18 DIAGNOSIS — Z791 Long term (current) use of non-steroidal anti-inflammatories (NSAID): Secondary | ICD-10-CM | POA: Insufficient documentation

## 2016-03-18 DIAGNOSIS — R0602 Shortness of breath: Secondary | ICD-10-CM | POA: Insufficient documentation

## 2016-03-18 DIAGNOSIS — Z79899 Other long term (current) drug therapy: Secondary | ICD-10-CM | POA: Diagnosis not present

## 2016-03-18 DIAGNOSIS — F909 Attention-deficit hyperactivity disorder, unspecified type: Secondary | ICD-10-CM | POA: Diagnosis not present

## 2016-03-18 DIAGNOSIS — F17200 Nicotine dependence, unspecified, uncomplicated: Secondary | ICD-10-CM | POA: Diagnosis not present

## 2016-03-18 DIAGNOSIS — F17210 Nicotine dependence, cigarettes, uncomplicated: Secondary | ICD-10-CM | POA: Insufficient documentation

## 2016-03-18 DIAGNOSIS — F331 Major depressive disorder, recurrent, moderate: Secondary | ICD-10-CM | POA: Diagnosis not present

## 2016-03-18 DIAGNOSIS — R079 Chest pain, unspecified: Secondary | ICD-10-CM | POA: Diagnosis not present

## 2016-03-18 DIAGNOSIS — I10 Essential (primary) hypertension: Secondary | ICD-10-CM | POA: Diagnosis not present

## 2016-03-18 DIAGNOSIS — J449 Chronic obstructive pulmonary disease, unspecified: Secondary | ICD-10-CM | POA: Insufficient documentation

## 2016-03-18 DIAGNOSIS — R21 Rash and other nonspecific skin eruption: Secondary | ICD-10-CM | POA: Insufficient documentation

## 2016-03-18 DIAGNOSIS — R51 Headache: Secondary | ICD-10-CM | POA: Insufficient documentation

## 2016-03-18 LAB — CBC WITH DIFFERENTIAL/PLATELET
Basophils Absolute: 0 10*3/uL (ref 0.0–0.1)
Basophils Relative: 0 %
EOS ABS: 0 10*3/uL (ref 0.0–0.7)
Eosinophils Relative: 0 %
HEMATOCRIT: 40.2 % (ref 36.0–46.0)
HEMOGLOBIN: 13.4 g/dL (ref 12.0–15.0)
LYMPHS ABS: 1 10*3/uL (ref 0.7–4.0)
Lymphocytes Relative: 5 %
MCH: 29.9 pg (ref 26.0–34.0)
MCHC: 33.3 g/dL (ref 30.0–36.0)
MCV: 89.7 fL (ref 78.0–100.0)
MONO ABS: 0.6 10*3/uL (ref 0.1–1.0)
MONOS PCT: 3 %
NEUTROS PCT: 92 %
Neutro Abs: 16.4 10*3/uL — ABNORMAL HIGH (ref 1.7–7.7)
Platelets: 322 10*3/uL (ref 150–400)
RBC: 4.48 MIL/uL (ref 3.87–5.11)
RDW: 13.2 % (ref 11.5–15.5)
WBC: 18 10*3/uL — ABNORMAL HIGH (ref 4.0–10.5)

## 2016-03-18 LAB — BASIC METABOLIC PANEL
ANION GAP: 6 (ref 5–15)
BUN: 18 mg/dL (ref 6–20)
CALCIUM: 9.3 mg/dL (ref 8.9–10.3)
CO2: 25 mmol/L (ref 22–32)
Chloride: 107 mmol/L (ref 101–111)
Creatinine, Ser: 0.61 mg/dL (ref 0.44–1.00)
GFR calc Af Amer: >60 mL/min (ref >60)
GFR calc non Af Amer: >60 mL/min (ref >60)
GLUCOSE: 177 mg/dL — AB (ref 65–99)
Potassium: 3.6 mmol/L (ref 3.5–5.1)
Sodium: 138 mmol/L (ref 135–145)

## 2016-03-18 LAB — TROPONIN I: Troponin I: <0.03 ng/mL (ref <0.03)

## 2016-03-18 MED ORDER — ACETAMINOPHEN 325 MG PO TABS
650.0000 mg | ORAL_TABLET | Freq: Once | ORAL | Status: AC
  Administered 2016-03-18: 650 mg via ORAL
  Filled 2016-03-18: qty 2

## 2016-03-18 MED ORDER — HYDROCHLOROTHIAZIDE 25 MG PO TABS: 25.0000 mg | ORAL_TABLET | Freq: Every day | ORAL | 0 refills | Status: DC

## 2016-03-18 NOTE — ED Provider Notes (Signed)
[facility] DEPT Provider Note   CSN: [id number] Arrival date & time: 03/18/16  1630     History   Chief Complaint Chief Complaint  Patient presents with  . Hypertension    HPI [name] is a 50 y.o. female.  HPI  50 year old female with a history of COPD and bipolar disorder presents with hypertension. She states that she went to her psychiatrist's office today as typically planned and her blood pressure was 230/105 on multiple checks. She states at that time, around 4 PM, she was having some palpitations that she describes as loud beading of her chest. She also had some transient shortness of breath. These have all resolved. Tells me she does not have a history of hypertension. Used to be on something for blood pressure but was taken off of it. Typically her blood pressure is elevated when she is to the doctor and was diagnosed with white coat hypertension. Does not take anything for blood pressure now. Yesterday she was seen in this ER after she broke out in hives and was given IV Benadryl, IV Solu-Medrol, IV Pepcid, and started on prednisone. She took her first dose of prednisone about 5 hours prior to her psych appointment today. She has a headache, states she freely gets headaches. This one is worse than typical but she has had headaches of this severity multiple times. Took ibuprofen with no relief.  Past Medical History:  Diagnosis Date  . ADD (attention deficit disorder)   . Bipolar 1 disorder ([place])   . COPD (chronic obstructive pulmonary disease) ([place])   . Depression   . GERD (gastroesophageal reflux disease)   . Hypertension   . IFG (impaired fasting glucose)   . Substance abuse     Patient Active Problem List   Diagnosis Date Noted  . COPD (chronic obstructive pulmonary disease) with emphysema ([place]) 12/20/2015  . CARPAL TUNNEL SYNDROME 04/16/2008  . NUMBNESS/TINGLING 04/16/2008    Past Surgical History:  Procedure Laterality Date  . ORTHOPEDIC  SURGERY    . TUBAL LIGATION      OB History    Gravida Para Term Preterm AB Living   2 2 2          SAB TAB Ectopic Multiple Live Births                   Home Medications    Prior to Admission medications   Medication Sig Start Date End Date Taking? Authorizing Provider  albuterol (VENTOLIN HFA) 108 (90 Base) MCG/ACT inhaler INHALE 2 PUFFS BY MOUTH EVERY 6 HOURS AS NEEDED FOR WHEEZING OR SHORTNESS OF BREATH 12/20/15  Yes Kathyrn Drown, MD  clonazePAM (KLONOPIN) 0.5 MG tablet Take 1 tablet by mouth at bedtime. 03/16/16  Yes Historical Provider, MD  diphenhydrAMINE (BENADRYL) 25 MG tablet Take 50 mg by mouth every 6 (six) hours as needed for itching or allergies.   Yes Historical Provider, MD  gabapentin (NEURONTIN) 300 MG capsule Take 300 mg by mouth at bedtime. 03/15/16  Yes Historical Provider, MD  ibuprofen (ADVIL,MOTRIN) 200 MG tablet Take 800 mg by mouth every 6 (six) hours as needed for headache or moderate pain. For pain    Yes Historical Provider, MD  loratadine (CLARITIN) 10 MG tablet Take 10 mg by mouth every morning.   Yes Historical Provider, MD  predniSONE (DELTASONE) 50 MG tablet 1 tablet daily for 7 days 03/17/16  Yes Nat Christen, MD  tiotropium (SPIRIVA HANDIHALER) 18 MCG inhalation capsule INHALE THE CONTENTS  OF 1 CAPSULE VIA HANDIHALER EVERY DAY 12/20/15  Yes Kathyrn Drown, MD  hydrochlorothiazide (HYDRODIURIL) 25 MG tablet Take 1 tablet (25 mg total) by mouth daily. 03/18/16   Sherwood Gambler, MD    Family History Family History  Problem Relation Age of Onset  . Atrial fibrillation Mother     Social History Social History  Substance Use Topics  . Smoking status: Current Every Day Smoker    Packs/day: 1.00    Types: Cigarettes    Last attempt to quit: 09/30/2010  . Smokeless tobacco: Never Used  . Alcohol use No     Allergies   Augmentin [amoxicillin-pot clavulanate]   Review of Systems Review of Systems  Respiratory: Positive for shortness of breath.    Cardiovascular: Negative for chest pain.  Gastrointestinal: Negative for vomiting.  Neurological: Positive for headaches. Negative for weakness.  All other systems reviewed and are negative.    Physical Exam Updated Vital Signs BP 161/67   Pulse 97   Temp 98.5 F (36.9 C) (Oral)   Resp 18   Ht 5\' 3"  (1.6 m)   Wt 230 lb (104.3 kg)   LMP 03/18/2013 (Approximate)   SpO2 97%   BMI 40.74 kg/m   Physical Exam  Constitutional: She is oriented to person, place, and time. She appears well-developed and well-nourished. No distress.  HENT:  Head: Normocephalic and atraumatic.  Right Ear: External ear normal.  Left Ear: External ear normal.  Nose: Nose normal.  Eyes: EOM are normal. Pupils are equal, round, and reactive to light. Right eye exhibits no discharge. Left eye exhibits no discharge.  Neck: Neck supple.  Cardiovascular: Normal rate, regular rhythm and normal heart sounds.   Pulmonary/Chest: Effort normal. She has wheezes (faint expiratory wheezes).  Abdominal: Soft. There is no tenderness.  Neurological: She is alert and oriented to person, place, and time.  CN 3-12 grossly intact. 5/5 strength in all 4 extremities. Grossly normal sensation. Normal finger to nose.   Skin: Skin is warm and dry. Rash (hives intermittently, mostly trunk) noted. She is not diaphoretic.  Nursing note and vitals reviewed.    ED Treatments / Results  Labs (all labs ordered are listed, but only abnormal results are displayed) Labs Reviewed  BASIC METABOLIC PANEL - Abnormal; Notable for the following:       Result Value   Glucose, Bld 177 (*)    All other components within normal limits  CBC WITH DIFFERENTIAL/PLATELET - Abnormal; Notable for the following:    WBC 18.0 (*)    Neutro Abs 16.4 (*)    All other components within normal limits  TROPONIN I    EKG  EKG Interpretation  Date/Time:  Wednesday March 18 2016 16:48:39 EDT Ventricular Rate:  100 PR Interval:    QRS  Duration: 95 QT Interval:  337 QTC Calculation: 435 R Axis:   79 Text Interpretation:  Sinus tachycardia Nonspecific repol abnormality, diffuse leads no significant change since Sept 2016 Confirmed by Regenia Skeeter MD, [place] [phone]) on 03/18/2016 5:15:57 PM       Radiology Dg Chest 2 View  Result Date: 03/18/2016 CLINICAL DATA:  Chest pain EXAM: CHEST  2 VIEW COMPARISON:  February 12, 2015 FINDINGS: There is no edema or consolidation. The heart size and pulmonary vascularity are normal. No adenopathy. No bone lesions. No pneumothorax. IMPRESSION: No edema or consolidation. Electronically Signed   By: Lowella Grip III M.D.   On: 03/18/2016 18:12    Procedures Procedures (including critical care  time)  Medications Ordered in ED Medications  acetaminophen (TYLENOL) tablet 650 mg (650 mg Oral Given 03/18/16 1814)     Initial Impression / Assessment and Plan / ED Course  I have reviewed the triage vital signs and the nursing notes.  Pertinent labs & imaging results that were available during my care of the patient were reviewed by me and considered in my medical decision making (see chart for details).  Clinical Course  Comment By Time  BP much better now but still hypertensive. Will check labs, CXR, observe. No focal neuro findings. Sherwood Gambler, MD 10/18 1730    BP has been trending down, now settled in the 140/80 range. Headache is improved. The HA seems acute on chronic and she's had headaches like this many times before. Doubt SAH, meningitis, acute bleed, etc. Probably has a degree of HTN at baseline and then prednisone increased it. No signs of acute end organ damage. I think her dyspnea and heart beating sensation were just from elevated BP and don't sound like ACS. Will put on HCTZ and have her f/u with her PCP closely. She still has hives and is still quite symptomatic so I think the prednisone is likely still needed. Prednisone explains the WBC and hyperglycemia. Discussed  return precautions  Final Clinical Impressions(s) / ED Diagnoses   Final diagnoses:  Essential hypertension    New Prescriptions Discharge Medication List as of 03/18/2016  6:34 PM    START taking these medications   Details  hydrochlorothiazide (HYDRODIURIL) 25 MG tablet Take 1 tablet (25 mg total) by mouth daily., Starting Wed 03/18/2016, Print         Sherwood Gambler, MD 03/18/16 2130

## 2016-03-18 NOTE — ED Notes (Signed)
EKG given to Dr. Cook  

## 2016-03-18 NOTE — ED Triage Notes (Signed)
Pt seen by psych dr today. BP checked 230/101 and was told to come to ED. Complains of HA all day. Denies CO. Pt reports she was seen last night for allergic reaction and was given prednisone

## 2016-03-19 ENCOUNTER — Encounter: Payer: Self-pay | Admitting: Family Medicine

## 2016-03-19 ENCOUNTER — Ambulatory Visit (INDEPENDENT_AMBULATORY_CARE_PROVIDER_SITE_OTHER): Payer: Federal, State, Local not specified - PPO | Admitting: Family Medicine

## 2016-03-19 VITALS — BP 138/88 | Temp 98.4°F | Ht 63.0 in | Wt 223.4 lb

## 2016-03-19 DIAGNOSIS — T783XXA Angioneurotic edema, initial encounter: Secondary | ICD-10-CM

## 2016-03-19 MED ORDER — PREDNISONE 20 MG PO TABS: ORAL_TABLET | ORAL | 0 refills | Status: DC

## 2016-03-19 MED ORDER — METHYLPREDNISOLONE ACETATE 40 MG/ML IJ SUSP
40.0000 mg | Freq: Once | INTRAMUSCULAR | Status: AC
  Administered 2016-03-19: 40 mg via INTRAMUSCULAR

## 2016-03-19 NOTE — Patient Instructions (Addendum)
Stop gabapentin- could be the source  May use Benadryl 1 to 2 every 4 hours as needed caution drowsiness  Prednisone taper as directed on the bottle  Allergist consult we will set up   Call us if BP up

## 2016-03-19 NOTE — Progress Notes (Signed)
   Subjective:    Patient ID: [name], female    DOB: 11-08-1965, 50 y.o.   MRN: [id number]  HPI Patient is here today for a ER follow up visit. Patient was seen at [facility] ED on 03/17/16 for an allergic reaction. Patient was also seen at the Er for high blood pressure also. Patient states that she has hives all over her body and she can't stop itching. Onset 3 days ago. Treatments tried: steroids, pepcid, benadryl, oatmeal bath and cortizone with no relief.   I reviewed over her ER record. I talked with the patient in detail. She relates that she had a little bit of relief initially in the ER but then the hives came back itching intensely but no breathing difficulties she does relate to starting gabapentin several weeks ago.  This patient has had problems in the past with angioedema sometimes related to stress sometimes related to cold weather but this time it's been coming on for no particular reason she recently started gabapentin several weeks ago. Review of Systems  no chest pain no shortness of breath no swelling in the throat does relate itching and hives denies nausea vomiting diarrhea.    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

## 2016-03-25 ENCOUNTER — Encounter: Payer: Self-pay | Admitting: Family Medicine

## 2016-04-14 DIAGNOSIS — F331 Major depressive disorder, recurrent, moderate: Secondary | ICD-10-CM | POA: Diagnosis not present

## 2016-04-15 ENCOUNTER — Other Ambulatory Visit: Payer: Self-pay

## 2016-04-15 MED ORDER — ALBUTEROL SULFATE HFA 108 (90 BASE) MCG/ACT IN AERS: 2.0000 | INHALATION_SPRAY | Freq: Four times a day (QID) | RESPIRATORY_TRACT | 5 refills | Status: DC | PRN

## 2016-04-29 ENCOUNTER — Observation Stay (HOSPITAL_COMMUNITY)
Admission: EM | Admit: 2016-04-29 | Discharge: 2016-04-30 | Disposition: A | Discharge status: Home / Self Care | Payer: Federal, State, Local not specified - PPO | Attending: Internal Medicine | Admitting: Internal Medicine

## 2016-04-29 ENCOUNTER — Encounter (HOSPITAL_COMMUNITY): Payer: Self-pay | Admitting: *Deleted

## 2016-04-29 ENCOUNTER — Emergency Department (HOSPITAL_COMMUNITY): Payer: Federal, State, Local not specified - PPO

## 2016-04-29 DIAGNOSIS — F909 Attention-deficit hyperactivity disorder, unspecified type: Secondary | ICD-10-CM | POA: Insufficient documentation

## 2016-04-29 DIAGNOSIS — J449 Chronic obstructive pulmonary disease, unspecified: Secondary | ICD-10-CM | POA: Diagnosis not present

## 2016-04-29 DIAGNOSIS — R079 Chest pain, unspecified: Secondary | ICD-10-CM | POA: Diagnosis not present

## 2016-04-29 DIAGNOSIS — F17210 Nicotine dependence, cigarettes, uncomplicated: Secondary | ICD-10-CM | POA: Diagnosis not present

## 2016-04-29 DIAGNOSIS — E876 Hypokalemia: Secondary | ICD-10-CM | POA: Diagnosis present

## 2016-04-29 DIAGNOSIS — Z79899 Other long term (current) drug therapy: Secondary | ICD-10-CM | POA: Diagnosis not present

## 2016-04-29 DIAGNOSIS — F17200 Nicotine dependence, unspecified, uncomplicated: Secondary | ICD-10-CM | POA: Diagnosis present

## 2016-04-29 DIAGNOSIS — I10 Essential (primary) hypertension: Secondary | ICD-10-CM | POA: Insufficient documentation

## 2016-04-29 DIAGNOSIS — J439 Emphysema, unspecified: Secondary | ICD-10-CM | POA: Diagnosis present

## 2016-04-29 LAB — CBC
HCT: 39.7 % (ref 36.0–46.0)
Hemoglobin: 13.1 g/dL (ref 12.0–15.0)
MCH: 29.9 pg (ref 26.0–34.0)
MCHC: 33 g/dL (ref 30.0–36.0)
MCV: 90.6 fL (ref 78.0–100.0)
Platelets: 286 10*3/uL (ref 150–400)
RBC: 4.38 MIL/uL (ref 3.87–5.11)
RDW: 13.7 % (ref 11.5–15.5)
WBC: 11.5 10*3/uL — ABNORMAL HIGH (ref 4.0–10.5)

## 2016-04-29 LAB — BASIC METABOLIC PANEL
Anion gap: 9 (ref 5–15)
BUN: 16 mg/dL (ref 6–20)
CO2: 25 mmol/L (ref 22–32)
Calcium: 8.9 mg/dL (ref 8.9–10.3)
Chloride: 101 mmol/L (ref 101–111)
Creatinine, Ser: 0.64 mg/dL (ref 0.44–1.00)
GFR calc Af Amer: >60 mL/min (ref >60)
GFR calc non Af Amer: >60 mL/min (ref >60)
Glucose, Bld: 79 mg/dL (ref 65–99)
Potassium: 3 mmol/L — ABNORMAL LOW (ref 3.5–5.1)
Sodium: 135 mmol/L (ref 135–145)

## 2016-04-29 LAB — D-DIMER, QUANTITATIVE (NOT AT ARMC): D-Dimer, Quant: 0.4 ug/mL-FEU (ref 0.00–0.50)

## 2016-04-29 LAB — TROPONIN I: Troponin I: <0.03 ng/mL (ref <0.03)

## 2016-04-29 MED ORDER — NICOTINE 21 MG/24HR TD PT24
21.0000 mg | MEDICATED_PATCH | Freq: Every day | TRANSDERMAL | Status: DC
  Administered 2016-04-30: 21 mg via TRANSDERMAL
  Filled 2016-04-29: qty 1

## 2016-04-29 MED ORDER — MORPHINE SULFATE (PF) 4 MG/ML IV SOLN
4.0000 mg | Freq: Once | INTRAVENOUS | Status: AC
  Administered 2016-04-29: 4 mg via INTRAVENOUS
  Filled 2016-04-29: qty 1

## 2016-04-29 MED ORDER — ONDANSETRON HCL 4 MG/2ML IJ SOLN
4.0000 mg | Freq: Once | INTRAMUSCULAR | Status: AC
  Administered 2016-04-29: 4 mg via INTRAVENOUS
  Filled 2016-04-29: qty 2

## 2016-04-29 MED ORDER — LORATADINE 10 MG PO TABS
10.0000 mg | ORAL_TABLET | ORAL | Status: DC
  Administered 2016-04-30: 10 mg via ORAL
  Filled 2016-04-29: qty 1

## 2016-04-29 MED ORDER — OXYCODONE HCL 5 MG PO TABS
10.0000 mg | ORAL_TABLET | Freq: Four times a day (QID) | ORAL | Status: DC | PRN
  Administered 2016-04-30 (×2): 10 mg via ORAL
  Filled 2016-04-29 (×2): qty 2

## 2016-04-29 MED ORDER — FAMOTIDINE IN NACL 20-0.9 MG/50ML-% IV SOLN
20.0000 mg | Freq: Once | INTRAVENOUS | Status: AC
Start: 2016-04-29 — End: 2016-04-30
  Administered 2016-04-29: 20 mg via INTRAVENOUS
  Filled 2016-04-29: qty 50

## 2016-04-29 MED ORDER — POTASSIUM CHLORIDE CRYS ER 20 MEQ PO TBCR
40.0000 meq | EXTENDED_RELEASE_TABLET | Freq: Once | ORAL | Status: AC
  Administered 2016-04-29: 40 meq via ORAL
  Filled 2016-04-29: qty 2

## 2016-04-29 MED ORDER — IPRATROPIUM-ALBUTEROL 0.5-2.5 (3) MG/3ML IN SOLN: 3.0000 mL | Freq: Four times a day (QID) | RESPIRATORY_TRACT | Status: DC | PRN

## 2016-04-29 MED ORDER — GABAPENTIN 300 MG PO CAPS: 300.0000 mg | ORAL_CAPSULE | Freq: Every day | ORAL | Status: DC

## 2016-04-29 MED ORDER — ACETAMINOPHEN 325 MG PO TABS: 650.0000 mg | ORAL_TABLET | Freq: Four times a day (QID) | ORAL | Status: DC | PRN

## 2016-04-29 MED ORDER — KETOROLAC TROMETHAMINE 30 MG/ML IJ SOLN
30.0000 mg | Freq: Four times a day (QID) | INTRAMUSCULAR | Status: DC
  Administered 2016-04-30 (×3): 30 mg via INTRAVENOUS
  Filled 2016-04-29 (×3): qty 1

## 2016-04-29 MED ORDER — IBUPROFEN 800 MG PO TABS: 800.0000 mg | ORAL_TABLET | Freq: Four times a day (QID) | ORAL | Status: DC | PRN

## 2016-04-29 MED ORDER — ONDANSETRON HCL 4 MG/2ML IJ SOLN: 4.0000 mg | Freq: Four times a day (QID) | INTRAMUSCULAR | Status: DC | PRN

## 2016-04-29 MED ORDER — KETOROLAC TROMETHAMINE 30 MG/ML IJ SOLN
15.0000 mg | Freq: Once | INTRAMUSCULAR | Status: AC
  Administered 2016-04-29: 15 mg via INTRAVENOUS
  Filled 2016-04-29: qty 1

## 2016-04-29 MED ORDER — FAMOTIDINE 20 MG PO TABS
20.0000 mg | ORAL_TABLET | Freq: Two times a day (BID) | ORAL | Status: DC
  Administered 2016-04-30: 20 mg via ORAL
  Filled 2016-04-29: qty 1

## 2016-04-29 MED ORDER — HYDROCHLOROTHIAZIDE 25 MG PO TABS
25.0000 mg | ORAL_TABLET | Freq: Every day | ORAL | Status: DC
  Administered 2016-04-30: 25 mg via ORAL
  Filled 2016-04-29: qty 1

## 2016-04-29 MED ORDER — PNEUMOCOCCAL VAC POLYVALENT 25 MCG/0.5ML IJ INJ
0.5000 mL | INJECTION | INTRAMUSCULAR | Status: AC
Start: 2016-04-30 — End: 2016-04-30
  Administered 2016-04-30: 0.5 mL via INTRAMUSCULAR
  Filled 2016-04-29: qty 0.5

## 2016-04-29 MED ORDER — TIOTROPIUM BROMIDE MONOHYDRATE 18 MCG IN CAPS
18.0000 ug | ORAL_CAPSULE | Freq: Every day | RESPIRATORY_TRACT | Status: DC
  Filled 2016-04-29: qty 5

## 2016-04-29 MED ORDER — ENOXAPARIN SODIUM 40 MG/0.4ML ~~LOC~~ SOLN
40.0000 mg | SUBCUTANEOUS | Status: DC
  Administered 2016-04-30: 40 mg via SUBCUTANEOUS
  Filled 2016-04-29: qty 0.4

## 2016-04-29 MED ORDER — MORPHINE SULFATE (PF) 4 MG/ML IV SOLN
4.0000 mg | Freq: Once | INTRAVENOUS | Status: AC
  Administered 2016-04-30: 4 mg via INTRAVENOUS
  Filled 2016-04-29: qty 1

## 2016-04-29 MED ORDER — CLONAZEPAM 0.5 MG PO TABS
0.5000 mg | ORAL_TABLET | Freq: Every day | ORAL | Status: DC
  Administered 2016-04-29: 0.5 mg via ORAL
  Filled 2016-04-29: qty 1

## 2016-04-29 MED ORDER — ASPIRIN 81 MG PO CHEW
324.0000 mg | CHEWABLE_TABLET | Freq: Once | ORAL | Status: AC
  Administered 2016-04-29: 324 mg via ORAL
  Filled 2016-04-29: qty 4

## 2016-04-29 NOTE — ED Notes (Signed)
Went to do vitals Dr in with pt 

## 2016-04-29 NOTE — H&P (Signed)
History and Physical    [name] [id number] DOB: 1966-04-16 DOA: 04/29/2016  PCP: Sallee Lange, MD   Patient coming from: Home.  Chief Complaint: Chest pain.  HPI: [name] is a 50 y.o. female with medical history significant of ADD, bipolar 1 disorder, COPD, depression, GERD, hypertension, impaired fasting glucose, tobacco use disorder, consistent abuse was coming to the emergency department with the complaining of precordial chest pain for the past 3 weeks.  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 She states that her pain is precordial, radiates to her neck, left arm and jaw associated with dyspnea, occasional palpitations, rated at a 7/10 in intensity, worsened by lying down, deep inspiration and cough. The pain gets relieved by ibuprofen 800 mg, which she has been using very often lately to be able to continue to work. The patient stated that about 2-3 weeks ago which she experienced mild sore throat and rhinorrhea.  ED Course: The patient received morphine and Toradol and states that her pain intensity level is at 4 out of 10 at this time. EKG showed T-wave abnormalities, but is similar to previous tracings. First troponin level was normal, her WBC was 11.5 and potassium 3.0 mmol/L. Chest radiograph did not show any acute abnormalities.  Review of Systems: As per HPI otherwise 10 point review of systems negative.    Past Medical History:  Diagnosis Date  . ADD (attention deficit disorder)   . Bipolar 1 disorder ([place])   . COPD (chronic obstructive pulmonary disease) ([place])   . Depression   . GERD (gastroesophageal reflux disease)   . Hypertension   . IFG (impaired fasting glucose)   . Substance abuse     Past Surgical History:  Procedure Laterality Date  . ORTHOPEDIC SURGERY    . TUBAL LIGATION       reports that she has been smoking Cigarettes.  She has been smoking about 1.00 pack  per day. She has never used smokeless tobacco. She reports that she does not drink alcohol or use drugs.  Allergies  Allergen Reactions  . Gabapentin Hives  . Augmentin [Amoxicillin-Pot Clavulanate] Nausea And Vomiting    Family History  Problem Relation Age of Onset  . Atrial fibrillation Mother    Prior to Admission medications   Medication Sig Start Date End Date Taking? Authorizing Provider  albuterol (PROAIR HFA) 108 (90 Base) MCG/ACT inhaler Inhale 2 puffs into the lungs every 6 (six) hours as needed for wheezing or shortness of breath. 04/15/16   Mikey Kirschner, MD  clonazePAM (KLONOPIN) 0.5 MG tablet Take 1 tablet by mouth at bedtime. 03/16/16   Historical Provider, MD  diphenhydrAMINE (BENADRYL) 25 MG tablet Take 50 mg by mouth every 6 (six) hours as needed for itching or allergies.    Historical Provider, MD  gabapentin (NEURONTIN) 300 MG capsule Take 300 mg by mouth at bedtime. 03/15/16   Historical Provider, MD  hydrochlorothiazide (HYDRODIURIL) 25 MG tablet Take 1 tablet (25 mg total) by mouth daily. 03/18/16   Sherwood Gambler, MD  ibuprofen (ADVIL,MOTRIN) 200 MG tablet Take 800 mg by mouth every 6 (six) hours as needed for headache or moderate pain. For pain     Historical Provider, MD  loratadine (CLARITIN) 10 MG tablet Take 10 mg by mouth every morning.    Historical Provider, MD  predniSONE (DELTASONE) 20 MG tablet 3qd for 3d then 2qd for 3d then 1qd for 3d  03/19/16   Kathyrn Drown, MD  tiotropium (SPIRIVA HANDIHALER) 18 MCG inhalation capsule INHALE THE CONTENTS OF 1 CAPSULE VIA HANDIHALER EVERY DAY 12/20/15   Kathyrn Drown, MD    Physical Exam:  Constitutional: NAD, calm, comfortable Vitals:   04/29/16 2030 04/29/16 2100 04/29/16 2130 04/29/16 2319  BP: 139/65 126/57 146/71 (!) 126/57  Pulse: 92 86 88 91  Resp: 22 15 20 20   Temp:    98.2 F (36.8 C)  TempSrc:    Oral  SpO2: 98% 100% 100% 96%  Weight:    102.5 kg (225 lb 14.4 oz)   Eyes: PERRL, lids and  conjunctivae normal ENMT: Mucous membranes are moist. Posterior pharynx clear of any exudate or lesions.Normal dentition.  Neck: normal, supple, no masses, no thyromegaly Respiratory: clear to auscultation bilaterally, no wheezing, no crackles. Normal respiratory effort. No accessory muscle use.  Cardiovascular: Regular rate and rhythm, no murmurs / rubs / gallops. No extremity edema. 2+ pedal pulses. No carotid bruits.  Abdomen: no tenderness, no masses palpated. No hepatosplenomegaly. Bowel sounds positive.  Musculoskeletal: no clubbing / cyanosis. No joint deformity upper and lower extremities. Good ROM, no contractures. Normal muscle tone.  Skin: no rashes, lesions, ulcers. No induration Neurologic: CN 2-12 grossly intact. Sensation intact, DTR normal. Strength 5/5 in all 4.  Psychiatric: Normal judgment and insight. Alert and oriented x 3. Normal mood.    Labs on Admission: I have personally reviewed following labs and imaging studies  CBC:  Recent Labs Lab 04/29/16 1807  WBC 11.5*  HGB 13.1  HCT 39.7  MCV 90.6  PLT [id number]   Basic Metabolic Panel:  Recent Labs Lab 04/29/16 1807  NA 135  K 3.0*  CL 101  CO2 25  GLUCOSE 79  BUN 16  CREATININE 0.64  CALCIUM 8.9   GFR: Estimated Creatinine Clearance: 96.2 mL/min (by C-G formula based on SCr of 0.64 mg/dL). Liver Function Tests: No results for input(s): AST, ALT, ALKPHOS, BILITOT, PROT, ALBUMIN in the last 168 hours. No results for input(s): LIPASE, AMYLASE in the last 168 hours. No results for input(s): AMMONIA in the last 168 hours. Coagulation Profile: No results for input(s): INR, PROTIME in the last 168 hours. Cardiac Enzymes:  Recent Labs Lab 04/29/16 1807  TROPONINI <0.03   BNP (last 3 results) No results for input(s): PROBNP in the last 8760 hours. HbA1C: No results for input(s): HGBA1C in the last 72 hours. CBG: No results for input(s): GLUCAP in the last 168 hours. Lipid Profile: No results for  input(s): CHOL, HDL, LDLCALC, TRIG, CHOLHDL, LDLDIRECT in the last 72 hours. Thyroid Function Tests: No results for input(s): TSH, T4TOTAL, FREET4, T3FREE, THYROIDAB in the last 72 hours. Anemia Panel: No results for input(s): VITAMINB12, FOLATE, FERRITIN, TIBC, IRON, RETICCTPCT in the last 72 hours. Urine analysis:    Component Value Date/Time   COLORURINE YELLOW 02/12/2015 [place] 02/12/2015 1542   LABSPEC 1.020 02/12/2015 1542   PHURINE 6.0 02/12/2015 1542   GLUCOSEU NEGATIVE 02/12/2015 1542   HGBUR NEGATIVE 02/12/2015 [place] 02/12/2015 1542   KETONESUR NEGATIVE 02/12/2015 1542   PROTEINUR NEGATIVE 02/12/2015 1542   UROBILINOGEN 0.2 02/12/2015 1542   NITRITE NEGATIVE 02/12/2015 1542   LEUKOCYTESUR NEGATIVE 02/12/2015 1542    Radiological Exams on Admission: Dg Chest 2 View  Result Date: 04/29/2016 CLINICAL DATA:  Chest pain for 3 weeks EXAM: CHEST  2 VIEW COMPARISON:  03/18/2016 FINDINGS: The heart size and mediastinal contours  are within normal limits. Both lungs are clear. The visualized skeletal structures are unremarkable. IMPRESSION: No active cardiopulmonary disease. Electronically Signed   By: Inez Catalina M.D.   On: 04/29/2016 18:01    EKG: Independently reviewed. Vent. rate 101 BPM PR interval 124 ms QRS duration 86 ms QT/QTc 352/456 ms P-R-T axes 69 66 55 Sinus tachycardia T wave abnormality, consider inferior ischemia Abnormal ECG  Assessment/Plan Principal Problem:   Chest pain Chest pain is atypical and has been going on for the past 2-3 weeks. Admit to observation/telemetry. Continue analgesics as needed. Trend troponin levels. Check echocardiogram in the morning. Consider consulting cardiology as an outpatient.  Active Problems:   COPD (chronic obstructive pulmonary disease) with emphysema (HCC) Stable at this time. Continue bronchodilators as needed.    Hypokalemia Replacing  Check potassium level in the  morning.    Tobacco use disorder Nicotine replacement therapy ordered Tobacco cessation information to be provided.    History of depression and anxiety. Continue clonazepam as needed.    DVT prophylaxis: Lovenox SQ. Code Status: Full code. Family Communication:  Disposition Plan: Admit for cardiac monitoring, troponin levels trending and echocardiogram. Consults called:  Admission status: Observation/telemetry.   Reubin Milan MD [facility] Pager [phone].  If 7PM-7AM, please contact night-coverage [email or web address] Password [email or web address]  04/29/2016, 11:30 PM

## 2016-04-29 NOTE — ED Notes (Signed)
Put pt on monitor per [facility]

## 2016-04-29 NOTE — ED Provider Notes (Signed)
[facility] DEPT Provider Note   CSN: [id number] Arrival date & time: 04/29/16  1659     History   Chief Complaint Chief Complaint  Patient presents with  . Chest Pain    HPI [name] is a 50 y.o. female.  HPI   50yF with CP. Onset about 2 weeks ago. Patient describes pain in the center of her chest which radiates up into her neck and her jaw. 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 Associated with shortness of breath. No unusual leg pain or swelling. No past history of DVT/PE. She is a smoker. No known history of CAD.  Past Medical History:  Diagnosis Date  . ADD (attention deficit disorder)   . Bipolar 1 disorder ([place])   . COPD (chronic obstructive pulmonary disease) ([place])   . Depression   . GERD (gastroesophageal reflux disease)   . Hypertension   . IFG (impaired fasting glucose)   . Substance abuse     Patient Active Problem List   Diagnosis Date Noted  . COPD (chronic obstructive pulmonary disease) with emphysema ([place]) 12/20/2015  . CARPAL TUNNEL SYNDROME 04/16/2008  . NUMBNESS/TINGLING 04/16/2008    Past Surgical History:  Procedure Laterality Date  . ORTHOPEDIC SURGERY    . TUBAL LIGATION      OB History    Gravida Para Term Preterm AB Living   2 2 2          SAB TAB Ectopic Multiple Live Births                   Home Medications    Prior to Admission medications   Medication Sig Start Date End Date Taking? Authorizing Provider  albuterol (PROAIR HFA) 108 (90 Base) MCG/ACT inhaler Inhale 2 puffs into the lungs every 6 (six) hours as needed for wheezing or shortness of breath. 04/15/16   Mikey Kirschner, MD  clonazePAM (KLONOPIN) 0.5 MG tablet Take 1 tablet by mouth at bedtime. 03/16/16   Historical Provider, MD  diphenhydrAMINE (BENADRYL) 25 MG tablet Take 50 mg by mouth every 6 (six) hours as needed for itching or allergies.     Historical Provider, MD  gabapentin (NEURONTIN) 300 MG capsule Take 300 mg by mouth at bedtime. 03/15/16   Historical Provider, MD  hydrochlorothiazide (HYDRODIURIL) 25 MG tablet Take 1 tablet (25 mg total) by mouth daily. 03/18/16   Sherwood Gambler, MD  ibuprofen (ADVIL,MOTRIN) 200 MG tablet Take 800 mg by mouth every 6 (six) hours as needed for headache or moderate pain. For pain     Historical Provider, MD  loratadine (CLARITIN) 10 MG tablet Take 10 mg by mouth every morning.    Historical Provider, MD  predniSONE (DELTASONE) 20 MG tablet 3qd for 3d then 2qd for 3d then 1qd for 3d 03/19/16   Kathyrn Drown, MD  tiotropium (SPIRIVA HANDIHALER) 18 MCG inhalation capsule INHALE THE CONTENTS OF 1 CAPSULE VIA HANDIHALER EVERY DAY 12/20/15   Kathyrn Drown, MD    Family History Family History  Problem Relation Age of Onset  . Atrial fibrillation Mother     Social History Social History  Substance Use Topics  . Smoking status: Current Every Day Smoker    Packs/day: 1.00    Types: Cigarettes    Last attempt to quit: 09/30/2010  . Smokeless tobacco: Never Used  . Alcohol use No  Allergies   Gabapentin and Augmentin [amoxicillin-pot clavulanate]   Review of Systems Review of Systems  All systems reviewed and negative, other than as noted in HPI.   Physical Exam Updated Vital Signs BP 136/74 (BP Location: Right Arm)   Pulse 94   Temp 98.4 F (36.9 C) (Oral)   Resp 20   LMP 03/18/2013 (Approximate)   SpO2 98%   Physical Exam  Constitutional: She appears well-developed and well-nourished. No distress.  HENT:  Head: Normocephalic and atraumatic.  Eyes: Conjunctivae are normal. Right eye exhibits no discharge. Left eye exhibits no discharge.  Neck: Neck supple.  Cardiovascular: Normal rate, regular rhythm and normal heart sounds.  Exam reveals no gallop and no friction rub.   No murmur heard. Pulmonary/Chest: Effort normal and breath sounds normal. No respiratory distress.   Abdominal: Soft. She exhibits no distension. There is no tenderness.  Musculoskeletal: She exhibits no edema or tenderness.  Neurological: She is alert.  Skin: Skin is warm and dry.  Psychiatric: She has a normal mood and affect. Her behavior is normal. Thought content normal.  Nursing note and vitals reviewed.    ED Treatments / Results  Labs (all labs ordered are listed, but only abnormal results are displayed) Labs Reviewed  BASIC METABOLIC PANEL - Abnormal; Notable for the following:       Result Value   Potassium 3.0 (*)    All other components within normal limits  CBC - Abnormal; Notable for the following:    WBC 11.5 (*)    All other components within normal limits  TROPONIN I  D-DIMER, QUANTITATIVE (NOT AT [facility])    EKG  EKG Interpretation  Date/Time:  Wednesday April 29 2016 17:09:52 EST Ventricular Rate:  101 PR Interval:  124 QRS Duration: 86 QT Interval:  352 QTC Calculation: 456 R Axis:   66 Text Interpretation:  Sinus tachycardia T wave abnormality, consider inferior ischemia Abnormal ECG Confirmed by Wilson Singer  MD, Victor Granados [phone]) on 04/29/2016 8:01:21 PM       Radiology Dg Chest 2 View  Result Date: 04/29/2016 CLINICAL DATA:  Chest pain for 3 weeks EXAM: CHEST  2 VIEW COMPARISON:  03/18/2016 FINDINGS: The heart size and mediastinal contours are within normal limits. Both lungs are clear. The visualized skeletal structures are unremarkable. IMPRESSION: No active cardiopulmonary disease. Electronically Signed   By: Inez Catalina M.D.   On: 04/29/2016 18:01    Procedures Procedures (including critical care time)  Medications Ordered in ED Medications  potassium chloride SA (K-DUR,KLOR-CON) CR tablet 40 mEq (not administered)  aspirin chewable tablet 324 mg (324 mg Oral Given 04/29/16 2016)  ketorolac (TORADOL) 30 MG/ML injection 15 mg (15 mg Intravenous Given 04/29/16 2056)  morphine 4 MG/ML injection 4 mg (4 mg Intravenous Given 04/29/16 2055)      Initial Impression / Assessment and Plan / ED Course  I have reviewed the triage vital signs and the nursing notes.  Pertinent labs & imaging results that were available during my care of the patient were reviewed by me and considered in my medical decision making (see chart for details).  Clinical Course     50 year old female with chest pain. Somewhat atypical in that she says she has a constant mild pain, but it is significantly exacerbated with exertion. There is radiation to her neck and jaw. She does have an abnormal EKG as well, although this is not acutely change from priors. Her d-dimer is normal. She has a past history of COPD  but she sounds clear on exam and her chest x-ray does not show any acute abnormality. Will admit for r/o.    Final Clinical Impressions(s) / ED Diagnoses   Final diagnoses:  Chest pain, unspecified type    New Prescriptions New Prescriptions   No medications on file     Virgel Manifold, MD 05/07/16 1647

## 2016-04-29 NOTE — ED Notes (Signed)
Dr still in with pt

## 2016-04-29 NOTE — ED Triage Notes (Signed)
Pt reports mid chest pain that started 3 weeks ago and has gotten worse since last night. Pt reports the mid chest pain radiates into her upper chest and into her neck. Reports SOB. Denies n/v, diaphoresis.

## 2016-04-30 ENCOUNTER — Observation Stay (HOSPITAL_BASED_OUTPATIENT_CLINIC_OR_DEPARTMENT_OTHER): Payer: Federal, State, Local not specified - PPO

## 2016-04-30 DIAGNOSIS — J449 Chronic obstructive pulmonary disease, unspecified: Secondary | ICD-10-CM | POA: Diagnosis not present

## 2016-04-30 DIAGNOSIS — I10 Essential (primary) hypertension: Secondary | ICD-10-CM | POA: Diagnosis not present

## 2016-04-30 DIAGNOSIS — F909 Attention-deficit hyperactivity disorder, unspecified type: Secondary | ICD-10-CM | POA: Diagnosis not present

## 2016-04-30 DIAGNOSIS — F17200 Nicotine dependence, unspecified, uncomplicated: Secondary | ICD-10-CM

## 2016-04-30 DIAGNOSIS — R079 Chest pain, unspecified: Secondary | ICD-10-CM

## 2016-04-30 LAB — TROPONIN I

## 2016-04-30 LAB — CBC
HCT: 36.9 % (ref 36.0–46.0)
HEMOGLOBIN: 12 g/dL (ref 12.0–15.0)
MCH: 30.1 pg (ref 26.0–34.0)
MCHC: 32.5 g/dL (ref 30.0–36.0)
MCV: 92.5 fL (ref 78.0–100.0)
Platelets: 261 10*3/uL (ref 150–400)
RBC: 3.99 MIL/uL (ref 3.87–5.11)
RDW: 14 % (ref 11.5–15.5)
WBC: 10.5 10*3/uL (ref 4.0–10.5)

## 2016-04-30 LAB — BASIC METABOLIC PANEL
ANION GAP: 7 (ref 5–15)
BUN: 19 mg/dL (ref 6–20)
CALCIUM: 8.9 mg/dL (ref 8.9–10.3)
CO2: 29 mmol/L (ref 22–32)
Chloride: 101 mmol/L (ref 101–111)
Creatinine, Ser: 0.71 mg/dL (ref 0.44–1.00)
Glucose, Bld: 105 mg/dL — ABNORMAL HIGH (ref 65–99)
Potassium: 3.8 mmol/L (ref 3.5–5.1)
SODIUM: 137 mmol/L (ref 135–145)

## 2016-04-30 LAB — ECHOCARDIOGRAM COMPLETE: WEIGHTICAEL: 3614.4 [oz_av]

## 2016-04-30 MED ORDER — LISINOPRIL 5 MG PO TABS: 5.0000 mg | ORAL_TABLET | Freq: Every day | ORAL | 1 refills | Status: DC

## 2016-04-30 MED ORDER — TIOTROPIUM BROMIDE MONOHYDRATE 18 MCG IN CAPS
ORAL_CAPSULE | RESPIRATORY_TRACT | Status: AC
  Filled 2016-04-30: qty 5

## 2016-04-30 NOTE — Discharge Summary (Signed)
Physician Discharge Summary  [name] [id number] DOB: [birth year] DOA: 04/29/2016  PCP: Sallee Lange, MD  Admit date: 04/29/2016 Discharge date: 04/30/2016  Time spent: 45 minutes  Recommendations for Outpatient Follow-up:  -Will be discharged home today. -Will arrange OP follow up with cardiology for potential stress testing.   Discharge Diagnoses:  Principal Problem:   Chest pain Active Problems:   COPD (chronic obstructive pulmonary disease) with emphysema (HCC)   Hypokalemia   Tobacco use disorder   Discharge Condition: Stable and improved  Filed Weights   04/29/16 2319  Weight: 102.5 kg (225 lb 14.4 oz)    History of present illness:  As per Dr. Olevia Bowens on 11/29: [name] is a 50 y.o. female with medical history significant of ADD, bipolar 1 disorder, COPD, depression, GERD, hypertension, impaired fasting glucose, tobacco use disorder, consistent abuse was coming to the emergency department with the complaining of precordial chest pain for the past 3 weeks.  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 She states that her pain is precordial, radiates to her neck, left arm and jaw associated with dyspnea, occasional palpitations, rated at a 7/10 in intensity, worsened by lying down, deep inspiration and cough. The pain gets relieved by ibuprofen 800 mg, which she has been using very often lately to be able to continue to work. The patient stated that about 2-3 weeks ago which she experienced mild sore throat and rhinorrhea.  ED Course: The patient received morphine and Toradol and states that her pain intensity level is at 4 out of 10 at this time. EKG showed T-wave abnormalities, but is similar to previous tracings. First troponin level was normal, her WBC was 11.5 and potassium 3.0 mmol/L. Chest radiograph did not show any acute abnormalities.  Hospital Course:   Chest Pain -She  describes pain as ongoing for 2-3 weeks; starts in her left chest and goes up into her jaw. 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 -Only risk factor for CAD is tobacco abuse and obesity. -Has ruled out for ACS with negative troponins, pain has resolved. EKF with NS T changes (that are not new). -Believe no further inpatient cardiac work up is required, altho would recommend OP follow up with cardiology for potential stress testing given the description of her symptoms.  Chronic Diastolic CHF -New diagnosis based on ECHO report only; no symptoms. -Will start on lisinopril 5 mg.  Procedures: ECHO:  Study Conclusions  - Left ventricle: The cavity size was normal. Wall thickness was   increased in a pattern of moderate LVH. Systolic function was   vigorous. The estimated ejection fraction was in the range of 65%   to 70%. Features are consistent with a pseudonormal left   ventricular filling pattern, with concomitant abnormal relaxation   and increased filling pressure (grade 2 diastolic dysfunction).   Doppler parameters are consistent with high ventricular filling    pressure.  Consultations:  None  Discharge Instructions  Discharge Instructions    Diet - low sodium heart healthy    Complete by:  As directed    Increase activity slowly    Complete by:  As directed        Medication List    STOP taking these medications   hydrochlorothiazide 25 MG tablet Commonly known as:  HYDRODIURIL   ibuprofen 200  MG tablet Commonly known as:  ADVIL,MOTRIN   predniSONE 20 MG tablet Commonly known as:  DELTASONE     TAKE these medications   albuterol 108 (90 Base) MCG/ACT inhaler Commonly known as:  PROAIR HFA Inhale 2 puffs into the lungs every 6 (six) hours as needed for wheezing or shortness of breath.   clonazePAM 0.5 MG tablet Commonly known as:  KLONOPIN Take 1 tablet by mouth at  bedtime.   lisinopril 5 MG tablet Commonly known as:  PRINIVIL,ZESTRIL Take 1 tablet (5 mg total) by mouth daily.   loratadine 10 MG tablet Commonly known as:  CLARITIN Take 10 mg by mouth every morning.   polyvinyl alcohol 1.4 % ophthalmic solution Commonly known as:  LIQUIFILM TEARS Place 2 drops into both eyes as needed for dry eyes.   tiotropium 18 MCG inhalation capsule Commonly known as:  SPIRIVA HANDIHALER INHALE THE CONTENTS OF 1 CAPSULE VIA HANDIHALER EVERY DAY      Allergies  Allergen Reactions  . Gabapentin Hives  . Augmentin [Amoxicillin-Pot Clavulanate] Nausea And Vomiting   Follow-up Information    Kate Sable, MD. Schedule an appointment as soon as possible for a visit in 2 week(s).   Specialty:  Cardiology Contact information: [place] [address] [phone]            The results of significant diagnostics from this hospitalization (including imaging, microbiology, ancillary and laboratory) are listed below for reference.    Significant Diagnostic Studies: Dg Chest 2 View  Result Date: 04/29/2016 CLINICAL DATA:  Chest pain for 3 weeks EXAM: CHEST  2 VIEW COMPARISON:  03/18/2016 FINDINGS: The heart size and mediastinal contours are within normal limits. Both lungs are clear. The visualized skeletal structures are unremarkable. IMPRESSION: No active cardiopulmonary disease. Electronically Signed   By: Inez Catalina M.D.   On: 04/29/2016 18:01    Microbiology: No results found for this or any previous visit (from the past 240 hour(s)).   Labs: Basic Metabolic Panel:  Recent Labs Lab 04/29/16 1807 04/30/16 0524  NA 135 137  K 3.0* 3.8  CL 101 101  CO2 25 29  GLUCOSE 79 105*  BUN 16 19  CREATININE 0.64 0.71  CALCIUM 8.9 8.9   Liver Function Tests: No results for input(s): AST, ALT, ALKPHOS, BILITOT, PROT, ALBUMIN in the last 168 hours. No results for input(s): LIPASE, AMYLASE in the last 168 hours. No results for  input(s): AMMONIA in the last 168 hours. CBC:  Recent Labs Lab 04/29/16 1807 04/30/16 0524  WBC 11.5* 10.5  HGB 13.1 12.0  HCT 39.7 36.9  MCV 90.6 92.5  PLT 286 261   Cardiac Enzymes:  Recent Labs Lab 04/29/16 1807 04/29/16 2338 04/30/16 0524  TROPONINI <0.03 <0.03 <0.03   BNP: BNP (last 3 results) No results for input(s): BNP in the last 8760 hours.  ProBNP (last 3 results) No results for input(s): PROBNP in the last 8760 hours.  CBG: No results for input(s): GLUCAP in the last 168 hours.     SignedLelon Frohlich  [facility] Pager: [phone] 04/30/2016, 12:26 PM

## 2016-04-30 NOTE — Progress Notes (Signed)
Patient with orders to be discharge home. Discharge instructions given, patient verbalized understanding. Prescriptions given. Patient stable. Patient left in private vehicle.  

## 2016-04-30 NOTE — Progress Notes (Signed)
*  PRELIMINARY RESULTS* Echocardiogram 2D Echocardiogram has been performed.  Leavy Cella 04/30/2016, 10:01 AM

## 2016-05-04 ENCOUNTER — Ambulatory Visit (INDEPENDENT_AMBULATORY_CARE_PROVIDER_SITE_OTHER): Payer: Federal, State, Local not specified - PPO | Admitting: Family Medicine

## 2016-05-04 ENCOUNTER — Encounter: Payer: Self-pay | Admitting: Family Medicine

## 2016-05-04 VITALS — BP 132/86 | HR 72 | Temp 100.4°F | Ht 63.0 in | Wt 226.0 lb

## 2016-05-04 DIAGNOSIS — I5189 Other ill-defined heart diseases: Secondary | ICD-10-CM | POA: Insufficient documentation

## 2016-05-04 DIAGNOSIS — R531 Weakness: Secondary | ICD-10-CM | POA: Diagnosis not present

## 2016-05-04 DIAGNOSIS — R1012 Left upper quadrant pain: Secondary | ICD-10-CM | POA: Diagnosis not present

## 2016-05-04 DIAGNOSIS — B9689 Other specified bacterial agents as the cause of diseases classified elsewhere: Secondary | ICD-10-CM

## 2016-05-04 DIAGNOSIS — J019 Acute sinusitis, unspecified: Secondary | ICD-10-CM | POA: Diagnosis not present

## 2016-05-04 DIAGNOSIS — R509 Fever, unspecified: Secondary | ICD-10-CM

## 2016-05-04 DIAGNOSIS — I519 Heart disease, unspecified: Secondary | ICD-10-CM

## 2016-05-04 MED ORDER — AZITHROMYCIN 250 MG PO TABS: ORAL_TABLET | ORAL | 0 refills | Status: DC

## 2016-05-04 NOTE — Progress Notes (Signed)
   Subjective:    Patient ID: [name], female    DOB: July 17, 1965, 50 y.o.   MRN: [id number]  HPIFollow up hospitalization for CHF.  This patient was in the hospital because of chest pains and shortness of breath they did multiple tests including troponin negative d-dimer negative echo showed good ejection fraction but showed diastolic dysfunction she was told that she had congestive heart failure that worried her greatly then recently over the past couple weeks she is noted head congestion drainage coughing wheezing low-grade fever in addition to this intermittent left upper abdomen abdominal discomfort worse with bending over Headache, cough, wheezing, abdominal pain for 3 weeks. Fever today 100.4.   Wants flu vaccine.     Review of Systems See above. Denies vomiting or diarrhea. Is coughing some and head congestion    Objective:   Physical Exam Lungs are clear hearts regular pulse normal BP is good extremities no edema abdomen tenderness in the left upper quadrant no guarding or rebound       Assessment & Plan:  Viral syndrome versus secondary rhinosinusitis Zithromax as directed Continue Spiriva Continue lisinopril Explanation given to patient regarding diastolic dysfunction Persistent left upper quadrant abdominal pain for the past 3 weeks referral to gastroenterology  This patient was written a work excuse to take her out of work through January 2. She is been having reoccurring illness over the past several weeks she is having significant chest pain and discomfort she needs further workup from cardiology. They will be working on trying to help get her set up for proper testing rule out possibility of coronary artery disease

## 2016-05-05 ENCOUNTER — Encounter: Payer: Self-pay | Admitting: Family Medicine

## 2016-05-06 NOTE — Progress Notes (Signed)
Please inform pt we tried, also inform patient if she starts having severe troubles she may have to go to ER but I doubt that she will run into that problem. Finally she should call the office of the cardiologist and ask that she be placed on a cancellation list

## 2016-05-07 ENCOUNTER — Encounter (HOSPITAL_COMMUNITY): Payer: Self-pay | Admitting: Emergency Medicine

## 2016-05-07 ENCOUNTER — Inpatient Hospital Stay (HOSPITAL_COMMUNITY)
Admission: EM | Admit: 2016-05-07 | Discharge: 2016-05-10 | DRG: 315 | Disposition: A | Discharge status: Home / Self Care | Payer: Federal, State, Local not specified - PPO | Attending: Internal Medicine | Admitting: Internal Medicine

## 2016-05-07 ENCOUNTER — Emergency Department (HOSPITAL_COMMUNITY): Payer: Federal, State, Local not specified - PPO

## 2016-05-07 ENCOUNTER — Telehealth: Payer: Self-pay | Admitting: Internal Medicine

## 2016-05-07 ENCOUNTER — Telehealth: Payer: Self-pay | Admitting: Family Medicine

## 2016-05-07 DIAGNOSIS — I313 Pericardial effusion (noninflammatory): Secondary | ICD-10-CM | POA: Diagnosis present

## 2016-05-07 DIAGNOSIS — Z888 Allergy status to other drugs, medicaments and biological substances status: Secondary | ICD-10-CM

## 2016-05-07 DIAGNOSIS — I309 Acute pericarditis, unspecified: Secondary | ICD-10-CM | POA: Diagnosis not present

## 2016-05-07 DIAGNOSIS — R509 Fever, unspecified: Secondary | ICD-10-CM | POA: Diagnosis not present

## 2016-05-07 DIAGNOSIS — R162 Hepatomegaly with splenomegaly, not elsewhere classified: Secondary | ICD-10-CM | POA: Diagnosis not present

## 2016-05-07 DIAGNOSIS — Z87891 Personal history of nicotine dependence: Secondary | ICD-10-CM

## 2016-05-07 DIAGNOSIS — I119 Hypertensive heart disease without heart failure: Secondary | ICD-10-CM | POA: Diagnosis not present

## 2016-05-07 DIAGNOSIS — E669 Obesity, unspecified: Secondary | ICD-10-CM | POA: Diagnosis present

## 2016-05-07 DIAGNOSIS — R5081 Fever presenting with conditions classified elsewhere: Secondary | ICD-10-CM | POA: Diagnosis not present

## 2016-05-07 DIAGNOSIS — Z880 Allergy status to penicillin: Secondary | ICD-10-CM | POA: Diagnosis not present

## 2016-05-07 DIAGNOSIS — D649 Anemia, unspecified: Secondary | ICD-10-CM | POA: Diagnosis not present

## 2016-05-07 DIAGNOSIS — I301 Infective pericarditis: Secondary | ICD-10-CM | POA: Diagnosis not present

## 2016-05-07 DIAGNOSIS — R05 Cough: Secondary | ICD-10-CM | POA: Diagnosis not present

## 2016-05-07 DIAGNOSIS — Z9851 Tubal ligation status: Secondary | ICD-10-CM | POA: Diagnosis not present

## 2016-05-07 DIAGNOSIS — J438 Other emphysema: Secondary | ICD-10-CM | POA: Diagnosis not present

## 2016-05-07 DIAGNOSIS — R000 Tachycardia, unspecified: Secondary | ICD-10-CM | POA: Diagnosis not present

## 2016-05-07 DIAGNOSIS — I319 Disease of pericardium, unspecified: Secondary | ICD-10-CM | POA: Diagnosis not present

## 2016-05-07 DIAGNOSIS — R079 Chest pain, unspecified: Secondary | ICD-10-CM | POA: Diagnosis not present

## 2016-05-07 DIAGNOSIS — F319 Bipolar disorder, unspecified: Secondary | ICD-10-CM | POA: Diagnosis not present

## 2016-05-07 DIAGNOSIS — J439 Emphysema, unspecified: Secondary | ICD-10-CM | POA: Diagnosis present

## 2016-05-07 DIAGNOSIS — I3139 Other pericardial effusion (noninflammatory): Secondary | ICD-10-CM | POA: Diagnosis present

## 2016-05-07 DIAGNOSIS — Z6841 Body Mass Index (BMI) 40.0 and over, adult: Secondary | ICD-10-CM | POA: Diagnosis not present

## 2016-05-07 DIAGNOSIS — I300 Acute nonspecific idiopathic pericarditis: Secondary | ICD-10-CM | POA: Diagnosis not present

## 2016-05-07 DIAGNOSIS — R1010 Upper abdominal pain, unspecified: Secondary | ICD-10-CM

## 2016-05-07 DIAGNOSIS — R071 Chest pain on breathing: Secondary | ICD-10-CM | POA: Diagnosis not present

## 2016-05-07 DIAGNOSIS — Z79899 Other long term (current) drug therapy: Secondary | ICD-10-CM

## 2016-05-07 DIAGNOSIS — K219 Gastro-esophageal reflux disease without esophagitis: Secondary | ICD-10-CM | POA: Diagnosis not present

## 2016-05-07 HISTORY — DX: Acute pericarditis, unspecified: I30.9

## 2016-05-07 LAB — BRAIN NATRIURETIC PEPTIDE: B Natriuretic Peptide: 77 pg/mL (ref 0.0–100.0)

## 2016-05-07 LAB — URINALYSIS, ROUTINE W REFLEX MICROSCOPIC
BILIRUBIN URINE: NEGATIVE
GLUCOSE, UA: NEGATIVE mg/dL
HGB URINE DIPSTICK: NEGATIVE
KETONES UR: NEGATIVE mg/dL
Leukocytes, UA: NEGATIVE
Nitrite: NEGATIVE
PH: 6 (ref 5.0–8.0)
PROTEIN: NEGATIVE mg/dL
Specific Gravity, Urine: 1.009 (ref 1.005–1.030)

## 2016-05-07 LAB — CBC WITH DIFFERENTIAL/PLATELET
Basophils Absolute: 0 10*3/uL (ref 0.0–0.1)
Basophils Relative: 0 %
EOS ABS: 0.1 10*3/uL (ref 0.0–0.7)
EOS PCT: 1 %
HEMATOCRIT: 34.1 % — AB (ref 36.0–46.0)
Hemoglobin: 11.3 g/dL — ABNORMAL LOW (ref 12.0–15.0)
LYMPHS ABS: 1.3 10*3/uL (ref 0.7–4.0)
LYMPHS PCT: 9 %
MCH: 30.4 pg (ref 26.0–34.0)
MCHC: 33.1 g/dL (ref 30.0–36.0)
MCV: 91.7 fL (ref 78.0–100.0)
MONO ABS: 1.1 10*3/uL — AB (ref 0.1–1.0)
Monocytes Relative: 8 %
Neutro Abs: 11.8 10*3/uL — ABNORMAL HIGH (ref 1.7–7.7)
Neutrophils Relative %: 82 %
PLATELETS: 416 10*3/uL — AB (ref 150–400)
RBC: 3.72 MIL/uL — AB (ref 3.87–5.11)
RDW: 13.6 % (ref 11.5–15.5)
WBC: 14.3 10*3/uL — AB (ref 4.0–10.5)

## 2016-05-07 LAB — COMPREHENSIVE METABOLIC PANEL
ALK PHOS: 78 U/L (ref 38–126)
ALT: 44 U/L (ref 14–54)
AST: 21 U/L (ref 15–41)
Albumin: 3.6 g/dL (ref 3.5–5.0)
Anion gap: 7 (ref 5–15)
BUN: 10 mg/dL (ref 6–20)
CALCIUM: 9.1 mg/dL (ref 8.9–10.3)
CHLORIDE: 105 mmol/L (ref 101–111)
CO2: 24 mmol/L (ref 22–32)
CREATININE: 0.55 mg/dL (ref 0.44–1.00)
Glucose, Bld: 117 mg/dL — ABNORMAL HIGH (ref 65–99)
Potassium: 3.9 mmol/L (ref 3.5–5.1)
Sodium: 136 mmol/L (ref 135–145)
Total Bilirubin: 0.3 mg/dL (ref 0.3–1.2)
Total Protein: 7.3 g/dL (ref 6.5–8.1)

## 2016-05-07 LAB — TROPONIN I: Troponin I: <0.03 ng/mL (ref <0.03)

## 2016-05-07 LAB — SEDIMENTATION RATE: SED RATE: 100 mm/h — AB (ref 0–22)

## 2016-05-07 LAB — LIPASE, BLOOD: Lipase: 18 U/L (ref 11–51)

## 2016-05-07 MED ORDER — SODIUM CHLORIDE 0.9 % IV SOLN
INTRAVENOUS | Status: DC
  Administered 2016-05-07: 22:00:00 via INTRAVENOUS

## 2016-05-07 MED ORDER — ONDANSETRON HCL 4 MG/2ML IJ SOLN
4.0000 mg | INTRAMUSCULAR | Status: AC | PRN
  Administered 2016-05-07 (×2): 4 mg via INTRAVENOUS
  Filled 2016-05-07 (×2): qty 2

## 2016-05-07 MED ORDER — MORPHINE SULFATE (PF) 4 MG/ML IV SOLN
4.0000 mg | INTRAVENOUS | Status: AC | PRN
  Administered 2016-05-07 (×2): 4 mg via INTRAVENOUS
  Filled 2016-05-07 (×2): qty 1

## 2016-05-07 MED ORDER — IOPAMIDOL (ISOVUE-300) INJECTION 61%
100.0000 mL | Freq: Once | INTRAVENOUS | Status: AC | PRN
  Administered 2016-05-07: 100 mL via INTRAVENOUS

## 2016-05-07 MED ORDER — MORPHINE SULFATE (PF) 4 MG/ML IV SOLN
4.0000 mg | INTRAVENOUS | Status: DC | PRN
  Administered 2016-05-07: 4 mg via INTRAVENOUS
  Filled 2016-05-07: qty 1

## 2016-05-07 MED ORDER — ONDANSETRON HCL 4 MG/2ML IJ SOLN: 4.0000 mg | INTRAMUSCULAR | Status: DC | PRN

## 2016-05-07 MED ORDER — MORPHINE SULFATE (PF) 4 MG/ML IV SOLN
INTRAVENOUS | Status: AC
  Administered 2016-05-07: 4 mg
  Filled 2016-05-07: qty 1

## 2016-05-07 MED ORDER — FAMOTIDINE IN NACL 20-0.9 MG/50ML-% IV SOLN
20.0000 mg | Freq: Once | INTRAVENOUS | Status: AC
  Administered 2016-05-07: 20 mg via INTRAVENOUS
  Filled 2016-05-07: qty 50

## 2016-05-07 MED ORDER — IOPAMIDOL (ISOVUE-300) INJECTION 61%
15.0000 mL | Freq: Once | INTRAVENOUS | Status: AC | PRN
  Administered 2016-05-07: 15 mL via ORAL

## 2016-05-07 NOTE — ED Notes (Signed)
Patient complaining of returning worsening pain. PRN morphine to be given.

## 2016-05-07 NOTE — ED Notes (Signed)
Pain increased after drinking contrast

## 2016-05-07 NOTE — Telephone Encounter (Signed)
Patient seen on 05/04/16 by Dr. Nicki Reaper.  She says she is having quite a bit of pain in her chest and stomach still and would like to know if we can prescribe her something.  [facility] [employer]

## 2016-05-07 NOTE — ED Triage Notes (Signed)
Pt reports upper abdominal pain that started this am, pain is recurrent. Pt contacted her PCP and was told to come to the ED for eval. Pt states her abdomen is swollen and has a lot of pressure.

## 2016-05-07 NOTE — Telephone Encounter (Signed)
Spoke with patient and patient stated that she is having chest pain under left rib, jaw pain. Patient stated that she slept with ice pack going across her to see if it would help the pain and it is no better. Discussed patients symptoms with Dr.Scott Luking and was told to advise the patient to go to [facility] ER due to reoccuring symptoms. Patient verbalized understanding.

## 2016-05-07 NOTE — ED Notes (Signed)
Patient complaining of returning pain. PRN morphine to be given.

## 2016-05-07 NOTE — Telephone Encounter (Signed)
Called by [facility] re [name].  50 yo F with a h/o ADD, bipolar 1 disorder, COPD, depression, GERD, hypertension, impaired fasting glucose, tobacco use disorder recently admitted from 04/29/2016-04/30/2016 for precordial cp that radiates to her neck, left arm and jaw, worsened by lying down, deep inspiration, and cough. Her H&P notes the pain was relieved by Ibuprofen 800 mg prn. Of note, at that time, the patient was c/o 2-3 weeks of mild sore throat and rhinorrhea.   Studies at that time were neg Trops, Echo with moderate LVH, mild AS/AR, EF 123456, grade 2 diastolic dysfunction. There was no pericardial effusion seen.   She was discharged and seen by her PCP who noted a fever of 100.4.   She has returned to the [facility] ER complaining of abdominal pain and pressure, CT with moderate pericardial effusion.   Hemodynamics stable.   It sounds that she has pericarditis, but myopericarditis needs to be ruled out.   The patient and provider are requesting admission to [facility].  I discussed her case with Dr. Marin Comment (hospitalist) who requested we take her onto Cardiology given the high volume of medicine admissions.  Accepted to Cardiology.   Allyson Sabal, MD

## 2016-05-07 NOTE — ED Provider Notes (Signed)
[place] DEPT Provider Note   CSN: [id number] Arrival date & time: 05/07/16  1709     History   Chief Complaint Chief Complaint  Patient presents with  . Abdominal Pain    HPI [name] is a 50 y.o. female.  HPI  Pt was seen at 1725.  Per pt, c/o gradual onset and persistence of constant upper abd "pain" since this morning.  Describes the abd pain as "swollen" and "pressure."  Denies N/V, no diarrhea, no fevers, no back pain, no rash, no CP/SOB, no black or blood in stools.      Past Medical History:  Diagnosis Date  . ADD (attention deficit disorder)   . Bipolar 1 disorder ([place])   . COPD (chronic obstructive pulmonary disease) ([place])   . Depression   . GERD (gastroesophageal reflux disease)   . Hypertension   . IFG (impaired fasting glucose)   . Substance abuse     Patient Active Problem List   Diagnosis Date Noted  . Diastolic dysfunction 43/88/8757  . Chest pain 04/29/2016  . Hypokalemia 04/29/2016  . Tobacco use disorder 04/29/2016  . COPD (chronic obstructive pulmonary disease) with emphysema ([place]) 12/20/2015  . CARPAL TUNNEL SYNDROME 04/16/2008  . NUMBNESS/TINGLING 04/16/2008    Past Surgical History:  Procedure Laterality Date  . ORTHOPEDIC SURGERY    . TUBAL LIGATION      OB History    Gravida Para Term Preterm AB Living   _0 SAB TAB Ectopic Multiple Live Births                   Home Medications    Prior to Admission medications   Medication Sig Start Date End Date Taking? Authorizing Provider  albuterol (PROAIR HFA) 108 (90 Base) MCG/ACT inhaler Inhale 2 puffs into the lungs every 6 (six) hours as needed for wheezing or shortness of breath. 04/15/16   Mikey Kirschner, MD  azithromycin (ZITHROMAX Z-PAK) 250 MG tablet Take 2 tablets (500 mg) on  Day 1,  followed by 1 tablet (250 mg) once daily on Days 2 through 5. 05/04/16   Kathyrn Drown, MD  clonazePAM (KLONOPIN) 0.5 MG tablet Take 1 tablet by mouth at bedtime.  03/16/16   Historical Provider, MD  lisinopril (PRINIVIL,ZESTRIL) 5 MG tablet Take 1 tablet (5 mg total) by mouth daily. 04/30/16   Erline Hau, MD  loratadine (CLARITIN) 10 MG tablet Take 10 mg by mouth every morning.    Historical Provider, MD  polyvinyl alcohol (LIQUIFILM TEARS) 1.4 % ophthalmic solution Place 2 drops into both eyes as needed for dry eyes.    Historical Provider, MD  tiotropium (SPIRIVA HANDIHALER) 18 MCG inhalation capsule INHALE THE CONTENTS OF 1 CAPSULE VIA HANDIHALER EVERY DAY 12/20/15   Kathyrn Drown, MD    Family History Family History  Problem Relation Age of Onset  . Atrial fibrillation Mother     Social History Social History  Substance Use Topics  . Smoking status: Former Smoker    Packs/day: 1.00    Types: Cigarettes    Quit date: 04/01/2016  . Smokeless tobacco: Never Used  . Alcohol use No     Allergies   Gabapentin and Augmentin [amoxicillin-pot clavulanate]   Review of Systems Review of Systems ROS: Statement: All systems negative except as marked or noted in the HPI; Constitutional: Negative for fever and chills. ; ; Eyes: Negative for eye pain, redness  and discharge. ; ; ENMT: Negative for ear pain, hoarseness, nasal congestion, sinus pressure and sore throat. ; ; Cardiovascular: Negative for chest pain, palpitations, diaphoresis, dyspnea and peripheral edema. ; ; Respiratory: Negative for cough, wheezing and stridor. ; ; Gastrointestinal: +abd pain. Negative for nausea, vomiting, diarrhea, blood in stool, hematemesis, jaundice and rectal bleeding. . ; ; Genitourinary: Negative for dysuria, flank pain and hematuria. ; ; Musculoskeletal: Negative for back pain and neck pain. Negative for swelling and trauma.; ; Skin: Negative for pruritus, rash, abrasions, blisters, bruising and skin lesion.; ; Neuro: Negative for headache, lightheadedness and neck stiffness. Negative for weakness, altered level of consciousness, altered mental status,  extremity weakness, paresthesias, involuntary movement, seizure and syncope.       Physical Exam Updated Vital Signs BP 143/73 (BP Location: Left Arm)   Pulse 96   Temp 97.7 F (36.5 C) (Oral)   Resp 18   Ht _0  (1.6 m)   Wt 225 lb (102.1 kg)   LMP 03/18/2013 (Approximate)   SpO2 100%   BMI 39.86 kg/m    BP 146/92 (BP Location: Left Arm)   Pulse 109   Temp 97.7 F (36.5 C) (Oral)   Resp 19   Ht _1  (1.6 m)   Wt 225 lb (102.1 kg)   LMP 03/18/2013 (Approximate)   SpO2 99%   BMI 39.86 kg/m    Physical Exam 1730: Physical examination:  Nursing notes reviewed; Vital signs and O2 SAT reviewed;  Constitutional: Well developed, Well nourished, Well hydrated, In no acute distress; Head:  Normocephalic, atraumatic; Eyes: EOMI, PERRL, No scleral icterus; ENMT: Mouth and pharynx normal, Mucous membranes moist; Neck: Supple, Full range of motion, No lymphadenopathy; Cardiovascular: Tachycardic rate and rhythm, No gallop; Respiratory: Breath sounds clear & equal bilaterally, No wheezes.  Speaking full sentences with ease, Normal respiratory effort/excursion; Chest: Nontender, Movement normal; Abdomen: Soft, +RUQ, mid-epigastric > LUQ areas tender to palp. Nondistended, Normal bowel sounds; Genitourinary: No CVA tenderness; Extremities: Pulses normal, No tenderness, No edema, No calf edema or asymmetry.; Neuro: AA&Ox3, Major CN grossly intact.  Speech clear. No gross focal motor or sensory deficits in extremities.; Skin: Color normal, Warm, Dry.   ED Treatments / Results  Labs (all labs ordered are listed, but only abnormal results are displayed)   EKG  EKG Interpretation  Date/Time:  Thursday May 07 2016 20:40:01 EST Ventricular Rate:  115 PR Interval:    QRS Duration: 81 QT Interval:  313 QTC Calculation: 433 R Axis:   78 Text Interpretation:  Sinus tachycardia Nonspecific T wave abnormality When compared with ECG of 04/30/2016 Rate faster and QT has shortened  Confirmed by [facility]  MD, Nunzio Cory [phone]) on 05/07/2016 9:20:22 PM       Radiology   Procedures Procedures (including critical care time)  Medications Ordered in ED Medications  ondansetron (ZOFRAN) injection 4 mg (not administered)  famotidine (PEPCID) IVPB 20 mg premix (not administered)  morphine 4 MG/ML injection 4 mg (not administered)     Initial Impression / Assessment and Plan / ED Course  I have reviewed the triage vital signs and the nursing notes.  Pertinent labs & imaging results that were available during my care of the patient were reviewed by me and considered in my medical decision making (see chart for details).  MDM Reviewed: previous chart, nursing note and vitals Reviewed previous: labs Interpretation: labs, CT scan and x-ray   Results for orders placed or performed during the hospital encounter of 05/07/16  Comprehensive metabolic panel  Result Value Ref Range   Sodium 136 135 - 145 mmol/L   Potassium 3.9 3.5 - 5.1 mmol/L   Chloride 105 101 - 111 mmol/L   CO2 24 22 - 32 mmol/L   Glucose, Bld 117 (H) 65 - 99 mg/dL   BUN 10 6 - 20 mg/dL   Creatinine, Ser 0.55 0.44 - 1.00 mg/dL   Calcium 9.1 8.9 - 10.3 mg/dL   Total Protein 7.3 6.5 - 8.1 g/dL   Albumin 3.6 3.5 - 5.0 g/dL   AST 21 15 - 41 U/L   ALT 44 14 - 54 U/L   Alkaline Phosphatase 78 38 - 126 U/L   Total Bilirubin 0.3 0.3 - 1.2 mg/dL   GFR calc non Af Amer >60 >60 mL/min   GFR calc Af Amer >60 >60 mL/min   Anion gap 7 5 - 15  Lipase, blood  Result Value Ref Range   Lipase 18 11 - 51 U/L  CBC with Differential  Result Value Ref Range   WBC 14.3 (H) 4.0 - 10.5 K/uL   RBC 3.72 (L) 3.87 - 5.11 MIL/uL   Hemoglobin 11.3 (L) 12.0 - 15.0 g/dL   HCT 34.1 (L) 36.0 - 46.0 %   MCV 91.7 78.0 - 100.0 fL   MCH 30.4 26.0 - 34.0 pg   MCHC 33.1 30.0 - 36.0 g/dL   RDW 13.6 11.5 - 15.5 %   Platelets 416 (H) 150 - 400 K/uL   Neutrophils Relative % 82 %   Neutro Abs 11.8 (H) 1.7 - 7.7 K/uL    Lymphocytes Relative 9 %   Lymphs Abs 1.3 0.7 - 4.0 K/uL   Monocytes Relative 8 %   Monocytes Absolute 1.1 (H) 0.1 - 1.0 K/uL   Eosinophils Relative 1 %   Eosinophils Absolute 0.1 0.0 - 0.7 K/uL   Basophils Relative 0 %   Basophils Absolute 0.0 0.0 - 0.1 K/uL   WBC Morphology ATYPICAL LYMPHOCYTES   Urinalysis, Routine w reflex microscopic  Result Value Ref Range   Color, Urine YELLOW YELLOW   APPearance CLEAR CLEAR   Specific Gravity, Urine 1.009 1.005 - 1.030   pH 6.0 5.0 - 8.0   Glucose, UA NEGATIVE NEGATIVE mg/dL   Hgb urine dipstick NEGATIVE NEGATIVE   Bilirubin Urine NEGATIVE NEGATIVE   Ketones, ur NEGATIVE NEGATIVE mg/dL   Protein, ur NEGATIVE NEGATIVE mg/dL   Nitrite NEGATIVE NEGATIVE   Leukocytes, UA NEGATIVE NEGATIVE  Brain natriuretic peptide  Result Value Ref Range   B Natriuretic Peptide 77.0 0.0 - 100.0 pg/mL  Troponin I  Result Value Ref Range   Troponin I <0.03 <0.03 ng/mL   Dg Chest 2 View Result Date: 05/07/2016 CLINICAL DATA:  50 y/o F; upper abdominal pain in central chest pain radiating to the left shoulder. 3 weeks of cough. EXAM: CHEST  2 VIEW COMPARISON:  04/29/2016 chest radiograph. 04/05/2004 chest radiograph. FINDINGS: Mild enlargement of the cardiac silhouette, possibly accentuated by technique. Increased interstitial markings may represent pulmonary vascular congestion. No focal consolidation. No pleural effusion. Bones are unremarkable. Left proximal humerus sclerotic focus seen in 2005, probably a cartilaginous rest. IMPRESSION: Mild enlargement of cardiac silhouette, possibly accentuated by technique. Increased interstitial markings, probably pulmonary vascular congestion. Electronically Signed   By: Kristine Garbe M.D.   On: 05/07/2016 19:55    Ct Abdomen Pelvis W Contrast Result Date: 05/07/2016 CLINICAL DATA:  Upper abdominal pain, onset this morning. EXAM: CT ABDOMEN AND PELVIS WITH CONTRAST  TECHNIQUE: Multidetector CT imaging of the  abdomen and pelvis was performed using the standard protocol following bolus administration of intravenous contrast. CONTRAST:  155m ISOVUE-300 IOPAMIDOL (ISOVUE-300) COMPARISON:  CT abdomen/ pelvis 02/12/2015, chest radiographs earlier this day. FINDINGS: Lower chest: Moderate circumferential pericardial effusion measures up to 15 mm in depth. Trace left pleural effusion. Lingular atelectasis. Hepatobiliary: Liver is prominent in size spanning 19.6 cm cranial caudal with borderline mild hepatic steatosis. No focal hepatic lesion. There is mild periportal edema. Gallbladder is distended, no calcified stone. No biliary dilatation. Pancreas: No ductal dilatation or inflammation. Spleen: Mildly enlarged measuring 15.2 x 5.2 x 11.3 cm (volume = 470 cm^3). No focal splenic abnormality. Adrenals/Urinary Tract: No adrenal nodule. Symmetric renal enhancement and excretion. No hydronephrosis. Trace perinephric edema about the lower right kidney. Ureters are decompressed. Urinary bladder is minimally distended. Stomach/Bowel: Stomach is physiologically distended. No small bowel dilatation or inflammation. The appendix is normal. There is an incidental 11 mm fat density lesion in the mid transverse colon, consistent with mural lipoma. No obstruction. This is unchanged from prior exam. Vascular/Lymphatic: Mild atherosclerosis without aneurysm. The scattered small retroperitoneal lymph nodes. Reproductive: Uterus is bulbous in appearance, possible underlying fibroids. No adnexal mass. Ovaries are quiescent. Other: No ascites or free air. Musculoskeletal: There are no acute or suspicious osseous abnormalities. Degenerative change in the spine with mild degenerative disc disease and facet arthropathy. Hemi transitional lumbosacral anatomy. IMPRESSION: 1. Moderate circumferential pericardial effusion measuring up to 15 mm, new. 2. Mild hepatosplenomegaly and hepatic steatosis. Periportal edema is nonspecific, can be seen with  hydration status versus right heart failure, however there is no right heart dilatation on CT. 3. Aortic atherosclerosis. 4. 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 Electronically Signed   By: MJeb LeveringM.D.   On: 05/07/2016 20:15    2045:  EPIC chart reviewed:  Pt was admitted to the hospital 11/29 - 04/30/16 for c/o CP on exertion x3 weeks; Echocardiogram from 04/30/16: no pericardial effusion, EF 65-70%. Pt seen in PMD office 05/04/16: c/o cough, wheeze, low grade fevers (office temp 100.4), intermittent left upper abd discomfort, worse with bending over, x3 weeks (referral to GI given); dx viral syndrome, rx zithromax.  [facility] case discussed, including:  HPI, pertinent PM/SHx, VS/PE, dx testing, ED course and treatment:  Agreeable to consult, requests to add CRP and ESR, [facility]  T/C to [facility] Dr LMarin Comment case discussed, including:  HPI, pertinent PM/SHx, VS/PE, dx testing, ED course and treatment:  States he will speak with Cards MD.  2120:  T/C from [facility] Dr. LMarin Comment case discussed, including:  HPI, pertinent PM/SHx, VS/PE, dx testing, ED course and treatment:  [facility]     Final Clinical Impressions(s) / ED Diagnoses   Final diagnoses:  None    New Prescriptions New Prescriptions   No medications on file     KFrancine Graven DO 05/09/16 2244

## 2016-05-08 ENCOUNTER — Encounter (HOSPITAL_COMMUNITY): Payer: Self-pay

## 2016-05-08 ENCOUNTER — Observation Stay (HOSPITAL_BASED_OUTPATIENT_CLINIC_OR_DEPARTMENT_OTHER): Payer: Federal, State, Local not specified - PPO

## 2016-05-08 DIAGNOSIS — J438 Other emphysema: Secondary | ICD-10-CM

## 2016-05-08 DIAGNOSIS — R5081 Fever presenting with conditions classified elsewhere: Secondary | ICD-10-CM

## 2016-05-08 DIAGNOSIS — R509 Fever, unspecified: Secondary | ICD-10-CM

## 2016-05-08 DIAGNOSIS — I313 Pericardial effusion (noninflammatory): Secondary | ICD-10-CM

## 2016-05-08 DIAGNOSIS — I319 Disease of pericardium, unspecified: Secondary | ICD-10-CM | POA: Diagnosis not present

## 2016-05-08 DIAGNOSIS — I301 Infective pericarditis: Secondary | ICD-10-CM

## 2016-05-08 LAB — BASIC METABOLIC PANEL
Anion gap: 9 (ref 5–15)
BUN: 7 mg/dL (ref 6–20)
CALCIUM: 8.2 mg/dL — AB (ref 8.9–10.3)
CHLORIDE: 104 mmol/L (ref 101–111)
CO2: 24 mmol/L (ref 22–32)
CREATININE: 0.62 mg/dL (ref 0.44–1.00)
GFR calc non Af Amer: >60 mL/min (ref >60)
GLUCOSE: 103 mg/dL — AB (ref 65–99)
Potassium: 4.2 mmol/L (ref 3.5–5.1)
Sodium: 137 mmol/L (ref 135–145)

## 2016-05-08 LAB — CBC
HEMATOCRIT: 28.3 % — AB (ref 36.0–46.0)
HEMOGLOBIN: 9 g/dL — AB (ref 12.0–15.0)
MCH: 29.1 pg (ref 26.0–34.0)
MCHC: 31.8 g/dL (ref 30.0–36.0)
MCV: 91.6 fL (ref 78.0–100.0)
Platelets: 316 10*3/uL (ref 150–400)
RBC: 3.09 MIL/uL — ABNORMAL LOW (ref 3.87–5.11)
RDW: 14.2 % (ref 11.5–15.5)
WBC: 9.2 10*3/uL (ref 4.0–10.5)

## 2016-05-08 LAB — HIV ANTIBODY (ROUTINE TESTING W REFLEX): HIV SCREEN 4TH GENERATION: NONREACTIVE

## 2016-05-08 LAB — ECHOCARDIOGRAM LIMITED
Height: 63 in
Weight: 3656 oz

## 2016-05-08 LAB — C-REACTIVE PROTEIN: CRP: 27.1 mg/dL — AB (ref <1.0)

## 2016-05-08 LAB — TSH: TSH: 1.866 u[IU]/mL (ref 0.350–4.500)

## 2016-05-08 MED ORDER — ALBUTEROL SULFATE (2.5 MG/3ML) 0.083% IN NEBU: 2.5000 mg | INHALATION_SOLUTION | Freq: Four times a day (QID) | RESPIRATORY_TRACT | Status: DC | PRN

## 2016-05-08 MED ORDER — ACETAMINOPHEN 325 MG PO TABS
650.0000 mg | ORAL_TABLET | Freq: Four times a day (QID) | ORAL | Status: DC | PRN
  Administered 2016-05-08 – 2016-05-09 (×4): 650 mg via ORAL
  Filled 2016-05-08 (×4): qty 2

## 2016-05-08 MED ORDER — ONDANSETRON HCL 4 MG/2ML IJ SOLN: 4.0000 mg | Freq: Three times a day (TID) | INTRAMUSCULAR | Status: AC | PRN

## 2016-05-08 MED ORDER — SODIUM CHLORIDE 0.9% FLUSH
3.0000 mL | Freq: Two times a day (BID) | INTRAVENOUS | Status: DC
  Administered 2016-05-08 – 2016-05-10 (×4): 3 mL via INTRAVENOUS

## 2016-05-08 MED ORDER — LORATADINE 10 MG PO TABS
10.0000 mg | ORAL_TABLET | Freq: Every day | ORAL | Status: DC
  Administered 2016-05-08 – 2016-05-10 (×3): 10 mg via ORAL
  Filled 2016-05-08 (×3): qty 1

## 2016-05-08 MED ORDER — POLYVINYL ALCOHOL 1.4 % OP SOLN: 2.0000 [drp] | OPHTHALMIC | Status: DC | PRN

## 2016-05-08 MED ORDER — OXYCODONE HCL 5 MG PO TABS
5.0000 mg | ORAL_TABLET | Freq: Four times a day (QID) | ORAL | Status: DC | PRN
  Administered 2016-05-08 – 2016-05-10 (×7): 5 mg via ORAL
  Filled 2016-05-08 (×7): qty 1

## 2016-05-08 MED ORDER — HYDROMORPHONE HCL 1 MG/ML IJ SOLN: 1.0000 mg | INTRAMUSCULAR | Status: DC | PRN

## 2016-05-08 MED ORDER — KETOROLAC TROMETHAMINE 30 MG/ML IJ SOLN
30.0000 mg | Freq: Once | INTRAMUSCULAR | Status: AC
  Administered 2016-05-08: 30 mg via INTRAVENOUS
  Filled 2016-05-08: qty 1

## 2016-05-08 MED ORDER — INFLUENZA VAC SPLIT QUAD 0.5 ML IM SUSY
0.5000 mL | PREFILLED_SYRINGE | INTRAMUSCULAR | Status: DC
  Filled 2016-05-08: qty 0.5

## 2016-05-08 MED ORDER — IBUPROFEN 600 MG PO TABS
600.0000 mg | ORAL_TABLET | Freq: Three times a day (TID) | ORAL | Status: DC
  Administered 2016-05-08 (×2): 600 mg via ORAL
  Filled 2016-05-08 (×2): qty 1

## 2016-05-08 MED ORDER — IBUPROFEN 600 MG PO TABS
600.0000 mg | ORAL_TABLET | Freq: Three times a day (TID) | ORAL | Status: DC
Start: 2016-05-09 — End: 2016-05-10
  Administered 2016-05-09 – 2016-05-10 (×5): 600 mg via ORAL
  Filled 2016-05-08 (×4): qty 1
  Filled 2016-05-08: qty 3

## 2016-05-08 MED ORDER — SODIUM CHLORIDE 0.9 % IV SOLN
INTRAVENOUS | Status: DC
  Administered 2016-05-08: 01:00:00 via INTRAVENOUS

## 2016-05-08 MED ORDER — TIOTROPIUM BROMIDE MONOHYDRATE 18 MCG IN CAPS
18.0000 ug | ORAL_CAPSULE | Freq: Every day | RESPIRATORY_TRACT | Status: DC
  Administered 2016-05-08 – 2016-05-10 (×3): 18 ug via RESPIRATORY_TRACT
  Filled 2016-05-08: qty 5

## 2016-05-08 MED ORDER — CLONAZEPAM 0.5 MG PO TABS
0.5000 mg | ORAL_TABLET | Freq: Every day | ORAL | Status: DC
  Administered 2016-05-08 – 2016-05-09 (×3): 0.5 mg via ORAL
  Filled 2016-05-08 (×3): qty 1

## 2016-05-08 MED ORDER — HEPARIN SODIUM (PORCINE) 5000 UNIT/ML IJ SOLN
5000.0000 [IU] | Freq: Three times a day (TID) | INTRAMUSCULAR | Status: DC
  Administered 2016-05-08 – 2016-05-10 (×6): 5000 [IU] via SUBCUTANEOUS
  Filled 2016-05-08 (×5): qty 1

## 2016-05-08 MED ORDER — KETOROLAC TROMETHAMINE 30 MG/ML IJ SOLN
30.0000 mg | Freq: Four times a day (QID) | INTRAMUSCULAR | Status: DC | PRN
  Administered 2016-05-08 – 2016-05-09 (×3): 30 mg via INTRAVENOUS
  Filled 2016-05-08 (×3): qty 1

## 2016-05-08 MED ORDER — COLCHICINE 0.6 MG PO TABS
0.6000 mg | ORAL_TABLET | Freq: Two times a day (BID) | ORAL | Status: DC
  Administered 2016-05-08 – 2016-05-10 (×6): 0.6 mg via ORAL
  Filled 2016-05-08 (×6): qty 1

## 2016-05-08 MED ORDER — PANTOPRAZOLE SODIUM 40 MG PO TBEC
40.0000 mg | DELAYED_RELEASE_TABLET | Freq: Every day | ORAL | Status: DC
  Administered 2016-05-08 – 2016-05-10 (×3): 40 mg via ORAL
  Filled 2016-05-08 (×3): qty 1

## 2016-05-08 NOTE — H&P (Addendum)
CARDIOLOGY H&P  CC: Chest pain, pericardial effusion  HPI: [name] is a 50 yo F with a h/o ADD, bipolar 1 disorder, COPD, depression, GERD, hypertension, impaired fasting glucose, tobacco use disorder recently admitted from 04/29/2016-04/30/2016 for precordial cp that radiates to her neck, left arm and jaw, worsened by lying down, improved by sitting up, worsened by deep inspiration and cough. Her H&P notes the pain was relieved by Ibuprofen 800 mg prn. Of note, at that time, the patient was c/o 2-3 weeks of mild sore throat and rhinorrhea.   Studies at that time were neg Trops, Echo with moderate LVH, mild AS/AR, EF 78%, grade 2 diastolic dysfunction. There was no pericardial effusion seen.   She was discharged and seen by her PCP who noted a fever of 100.4.   She has returned to the [facility] ER complaining of abdominal pain and pressure which was debilitating and prevented her for working, CT with moderate pericardial effusion, requested transfer to [facility].  She does not recall the coryza symptoms which were previously noted, but does note that developed a maculopapular rash to [facility] for which she received steroids.  She denies any rashes, joint symptoms, photosensitivity, eye symptoms, kidney dysfunction, or seizures.  She denies any rapid weight loss, night sweats, hemoptysis.   Review of Systems:     Cardiac Review of Systems: {Y] = yes '[ ]'$  = no  Chest Pain '[X]'$   Resting SOB '[X]'$  Exertional SOB  '[X]'$   Orthopnea '[X]'$    Pedal Edema [   ]    Palpitations [  ] Syncope  [  ]   Presyncope [   ]  General Review of Systems: [Y] = yes [  ]=no Constitional: recent weight change [  ]; anorexia [  ]; fatigue [  ]; nausea [  ]; night sweats [  ]; fever [X; or chills '[X]'$ ;                                                                     Dental: poor dentition[  ];   Eye : blurred vision [  ]; diplopia [   ]; vision changes [  ];  Amaurosis fugax[  ]; Resp: cough [X;  wheezing'[X]'$ ;   hemoptysis[  ]; shortness of breath'[X]'$ ; paroxysmal nocturnal dyspnea[  ]; dyspnea on exertion'[X]'$ ; or orthopnea'[X]'$ ;  GI:  gallstones[  ], vomiting[  ];  dysphagia[  ]; melena[  ];  hematochezia [  ]; heartburn[  ];   GU: kidney stones [  ]; hematuria[  ];   dysuria [  ];  nocturia[  ];               Skin: rash [  ], swelling[  ];, hair loss[  ];  peripheral edema[  ];  or itching[  ]; Musculosketetal: myalgias[  ];  joint swelling[  ];  joint erythema[  ];  joint pain[  ];  back pain[  ];  Heme/Lymph: bruising[  ];  bleeding[  ];  anemia[  ];  Neuro: TIA[  ];  headaches[  ];  stroke[  ];  vertigo[  ];  seizures[  ];   paresthesias[  ];  difficulty walking[  ];  Psych:depression[  ]; anxiety'[X]'$ ;  Endocrine: diabetes[  ];  thyroid dysfunction[  ];  Other:  Past Medical History:  Diagnosis Date  . ADD (attention deficit disorder)   . Bipolar 1 disorder ([place])   . COPD (chronic obstructive pulmonary disease) ([place])   . Depression   . GERD (gastroesophageal reflux disease)   . Hypertension   . IFG (impaired fasting glucose)   . Substance abuse     Current Meds  Medication Sig  . albuterol (PROAIR HFA) 108 (90 Base) MCG/ACT inhaler Inhale 2 puffs into the lungs every 6 (six) hours as needed for wheezing or shortness of breath.  . clonazePAM (KLONOPIN) 0.5 MG tablet Take 1 tablet by mouth at bedtime.  Marland Kitchen lisinopril (PRINIVIL,ZESTRIL) 5 MG tablet Take 1 tablet (5 mg total) by mouth daily.  Marland Kitchen loratadine (CLARITIN) 10 MG tablet Take 10 mg by mouth every morning.  . polyvinyl alcohol (LIQUIFILM TEARS) 1.4 % ophthalmic solution Place 2 drops into both eyes as needed for dry eyes.  Marland Kitchen tiotropium (SPIRIVA HANDIHALER) 18 MCG inhalation capsule INHALE THE CONTENTS OF 1 CAPSULE VIA HANDIHALER EVERY DAY    Allergies  Allergen Reactions  . Gabapentin Hives  . Augmentin [Amoxicillin-Pot Clavulanate] Nausea And Vomiting    Social History   Social History  . Marital status: Divorced    Spouse name:  N/A  . Number of children: N/A  . Years of education: N/A   Occupational History  . Not on file.   Social History Main Topics  . Smoking status: Former Smoker    Packs/day: 1.00    Types: Cigarettes    Quit date: 04/01/2016  . Smokeless tobacco: Never Used  . Alcohol use No  . Drug use: No  . Sexual activity: Yes    Birth control/ protection: Surgical   Other Topics Concern  . Not on file   Social History Narrative  . No narrative on file    Family History  Problem Relation Age of Onset  . Atrial fibrillation Mother     PHYSICAL EXAM: Vitals:   05/07/16 2323 05/08/16 0043  BP: 129/67 (!) 126/55  Pulse: 111 (!) 111  Resp: 14 16  Temp:  (!) 101.8 F (38.8 C)  BP L 120/68 BP R 117/65  General:  Cushingoid, O2 by [place] HEENT: Normal Neck: Supple. no JVD. Carotids 2+ bilat; no bruits. No lymphadenopathy or thryomegaly appreciated. Cor: PMI nondisplaced. Regular rate & rhythm. No pericaridal rub, rubs, gallops or murmurs. Pulsus <10, able to lie flat albiet in much pain. Lungs: Poor breath sounds Abdomen: Soft, nontender, nondistended. No hepatosplenomegaly. No bruits or masses. Good bowel sounds. Obese. Extremities: No cyanosis, clubbing, rash, edema Neuro: alert & oriented x 3, cranial nerves grossly intact. moves all 4 extremities w/o difficulty. Affect pleasant.  ECG: Sinus tachycardia  Results for orders placed or performed during the hospital encounter of 05/07/16 (from the past 24 hour(s))  Comprehensive metabolic panel     Status: Abnormal   Collection Time: 05/07/16  5:36 PM  Result Value Ref Range   Sodium 136 135 - 145 mmol/L   Potassium 3.9 3.5 - 5.1 mmol/L   Chloride 105 101 - 111 mmol/L   CO2 24 22 - 32 mmol/L   Glucose, Bld 117 (H) 65 - 99 mg/dL   BUN 10 6 - 20 mg/dL   Creatinine, Ser 0.55 0.44 - 1.00 mg/dL   Calcium 9.1 8.9 - 10.3 mg/dL   Total Protein 7.3 6.5 - 8.1 g/dL   Albumin 3.6 3.5 - 5.0 g/dL   AST  21 15 - 41 U/L   ALT 44 14 - 54 U/L    Alkaline Phosphatase 78 38 - 126 U/L   Total Bilirubin 0.3 0.3 - 1.2 mg/dL   GFR calc non Af Amer >60 >60 mL/min   GFR calc Af Amer >60 >60 mL/min   Anion gap 7 5 - 15  Lipase, blood     Status: None   Collection Time: 05/07/16  5:36 PM  Result Value Ref Range   Lipase 18 11 - 51 U/L  CBC with Differential     Status: Abnormal   Collection Time: 05/07/16  5:36 PM  Result Value Ref Range   WBC 14.3 (H) 4.0 - 10.5 K/uL   RBC 3.72 (L) 3.87 - 5.11 MIL/uL   Hemoglobin 11.3 (L) 12.0 - 15.0 g/dL   HCT 34.1 (L) 36.0 - 46.0 %   MCV 91.7 78.0 - 100.0 fL   MCH 30.4 26.0 - 34.0 pg   MCHC 33.1 30.0 - 36.0 g/dL   RDW 13.6 11.5 - 15.5 %   Platelets 416 (H) 150 - 400 K/uL   Neutrophils Relative % 82 %   Neutro Abs 11.8 (H) 1.7 - 7.7 K/uL   Lymphocytes Relative 9 %   Lymphs Abs 1.3 0.7 - 4.0 K/uL   Monocytes Relative 8 %   Monocytes Absolute 1.1 (H) 0.1 - 1.0 K/uL   Eosinophils Relative 1 %   Eosinophils Absolute 0.1 0.0 - 0.7 K/uL   Basophils Relative 0 %   Basophils Absolute 0.0 0.0 - 0.1 K/uL   WBC Morphology ATYPICAL LYMPHOCYTES   Brain natriuretic peptide     Status: None   Collection Time: 05/07/16  5:36 PM  Result Value Ref Range   B Natriuretic Peptide 77.0 0.0 - 100.0 pg/mL  Troponin I     Status: None   Collection Time: 05/07/16  5:36 PM  Result Value Ref Range   Troponin I <0.03 <0.03 ng/mL  Sedimentation rate     Status: Abnormal   Collection Time: 05/07/16  5:36 PM  Result Value Ref Range   Sed Rate 100 (H) 0 - 22 mm/hr  Urinalysis, Routine w reflex microscopic     Status: None   Collection Time: 05/07/16  5:38 PM  Result Value Ref Range   Color, Urine YELLOW YELLOW   APPearance CLEAR CLEAR   Specific Gravity, Urine 1.009 1.005 - 1.030   pH 6.0 5.0 - 8.0   Glucose, UA NEGATIVE NEGATIVE mg/dL   Hgb urine dipstick NEGATIVE NEGATIVE   Bilirubin Urine NEGATIVE NEGATIVE   Ketones, ur NEGATIVE NEGATIVE mg/dL   Protein, ur NEGATIVE NEGATIVE mg/dL   Nitrite NEGATIVE  NEGATIVE   Leukocytes, UA NEGATIVE NEGATIVE   Dg Chest 2 View  Result Date: 05/07/2016 CLINICAL DATA:  50 y/o F; upper abdominal pain in central chest pain radiating to the left shoulder. 3 weeks of cough. EXAM: CHEST  2 VIEW COMPARISON:  04/29/2016 chest radiograph. 04/05/2004 chest radiograph. FINDINGS: Mild enlargement of the cardiac silhouette, possibly accentuated by technique. Increased interstitial markings may represent pulmonary vascular congestion. No focal consolidation. No pleural effusion. Bones are unremarkable. Left proximal humerus sclerotic focus seen in 2005, probably a cartilaginous rest. IMPRESSION: Mild enlargement of cardiac silhouette, possibly accentuated by technique. Increased interstitial markings, probably pulmonary vascular congestion. Electronically Signed   By: Kristine Garbe M.D.   On: 05/07/2016 19:55   Ct Abdomen Pelvis W Contrast  Result Date: 05/07/2016 CLINICAL DATA:  Upper abdominal pain, onset  this morning. EXAM: CT ABDOMEN AND PELVIS WITH CONTRAST TECHNIQUE: Multidetector CT imaging of the abdomen and pelvis was performed using the standard protocol following bolus administration of intravenous contrast. CONTRAST:  140m ISOVUE-300 IOPAMIDOL (ISOVUE-300) COMPARISON:  CT abdomen/ pelvis 02/12/2015, chest radiographs earlier this day. FINDINGS: Lower chest: Moderate circumferential pericardial effusion measures up to 15 mm in depth. Trace left pleural effusion. Lingular atelectasis. Hepatobiliary: Liver is prominent in size spanning 19.6 cm cranial caudal with borderline mild hepatic steatosis. No focal hepatic lesion. There is mild periportal edema. Gallbladder is distended, no calcified stone. No biliary dilatation. Pancreas: No ductal dilatation or inflammation. Spleen: Mildly enlarged measuring 15.2 x 5.2 x 11.3 cm (volume = 470 cm^3). No focal splenic abnormality. Adrenals/Urinary Tract: No adrenal nodule. Symmetric renal enhancement and excretion. No  hydronephrosis. Trace perinephric edema about the lower right kidney. Ureters are decompressed. Urinary bladder is minimally distended. Stomach/Bowel: Stomach is physiologically distended. No small bowel dilatation or inflammation. The appendix is normal. There is an incidental 11 mm fat density lesion in the mid transverse colon, consistent with mural lipoma. No obstruction. This is unchanged from prior exam. Vascular/Lymphatic: Mild atherosclerosis without aneurysm. The scattered small retroperitoneal lymph nodes. Reproductive: Uterus is bulbous in appearance, possible underlying fibroids. No adnexal mass. Ovaries are quiescent. Other: No ascites or free air. Musculoskeletal: There are no acute or suspicious osseous abnormalities. Degenerative change in the spine with mild degenerative disc disease and facet arthropathy. Hemi transitional lumbosacral anatomy. IMPRESSION: 1. Moderate circumferential pericardial effusion measuring up to 15 mm, new. 2. Mild hepatosplenomegaly and hepatic steatosis. Periportal edema is nonspecific, can be seen with hydration status versus right heart failure, however there is no right heart dilatation on CT. 3. Aortic atherosclerosis. 4. 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 Electronically Signed   By: MJeb LeveringM.D.   On: 05/07/2016 20:15   On my review, her pericarditis also appears thickened.  ASSESSMENT: 50yo F with cp improved by leaning forward, SOB, pericardial effusion, and negative Trops most consistent with acute pericarditis.   PLAN/DISCUSSION: #) Acute pericarditis with effusion - unclear etiology, may be related to viral illness 3-4 weeks ago, but need to r/o other etiologies. Notably, I do not think this is an acute Type A dissection that has propagated proximally. Dx: - Tele, close monitoring of hemodynamics given the possibility of developing tamponade - Trops neg x 1 - TTE - CT as above - CBC, Chem 7,  TSH, ESR - Given relatively young age, fevers, effusion, check ANA (needs to be ordered in the Am) Tx: - Toradol x 1, Ibuprofen 600 mg tid x 2 weeks, Omperazole 20 mg daily, Colchicine 0.6 mg b.i.d. x 3 months  #) Fevers Dx: - BCx x 2, CXR, UCx Tx: - Given relative hemodynamic stability and unclear etiology, reasonable to hold Abx; low threshold to reconsider if hemodynamic deterioration  - Tylenol prn  #) COPD - Continue inhailers  #) Chronic diastolic dysfunction - Monitor volume status  #) DVT Ppx - Hep [place]  FULL CODE  AAllyson Sabal MD

## 2016-05-08 NOTE — Progress Notes (Signed)
Pt arrived via [facility]. Pt denies chest pain. Vital signs stable. Pt placed on tele and oriented to room. Plan of care discussed with patient and family. Md aware.

## 2016-05-08 NOTE — Progress Notes (Signed)
  Echocardiogram 2D Echocardiogram limited has been performed.  Tresa Res 05/08/2016, 12:28 PM

## 2016-05-08 NOTE — Progress Notes (Signed)
Patient Name: [name] Date of Encounter: 05/08/2016  Active Problems:   COPD (chronic obstructive pulmonary disease) with emphysema (HCC)   Chest pain   Pericardial effusion   Fever   Length of Stay: 0  SUBJECTIVE  The patient has 6/10 chest pain - pleuritic on inspiration.   CURRENT MEDS . clonazePAM  0.5 mg Oral QHS  . colchicine  0.6 mg Oral BID  . heparin  5,000 Units Subcutaneous Q8H  . ibuprofen  600 mg Oral TID WC  . [START ON 05/09/2016] Influenza vac split quadrivalent PF  0.5 mL Intramuscular Tomorrow-1000  . loratadine  10 mg Oral Daily  . pantoprazole  40 mg Oral Daily  . sodium chloride flush  3 mL Intravenous Q12H  . tiotropium  18 mcg Inhalation Daily    OBJECTIVE  Vitals:   05/07/16 2300 05/07/16 2323 05/08/16 0043 05/08/16 0500  BP: 94/57 129/67 (!) 126/55 (!) 110/49  Pulse: 108 111 (!) 111 81  Resp: '22 14 16 18  '$ Temp:   (!) 101.8 F (38.8 C) 98.7 F (37.1 C)  TempSrc:   Oral Oral  SpO2: 100% 100% 100% 99%  Weight:   228 lb 8 oz (103.6 kg)   Height:   '5\' 3"'$  (1.6 m)    No intake or output data in the 24 hours ending 05/08/16 1313 Filed Weights   05/07/16 1716 05/08/16 0043  Weight: 225 lb (102.1 kg) 228 lb 8 oz (103.6 kg)    PHYSICAL EXAM  General: Pleasant, in mild distress sec to pain Neuro: Alert and oriented X 3. Moves all extremities spontaneously. Psych: Normal affect. HEENT:  Normal  Neck: Supple without bruits or JVD. Lungs:  Resp regular and unlabored, CTA. Heart: RRR no s3, s4, or murmurs. Abdomen: Soft, non-tender, non-distended, BS + x 4.  Extremities: No clubbing, cyanosis or edema. DP/PT/Radials 2+ and equal bilaterally.  Accessory Clinical Findings  CBC  Recent Labs  05/07/16 1736 05/08/16 0945  WBC 14.3* 9.2  NEUTROABS 11.8*  --   HGB 11.3* 9.0*  HCT 34.1* 28.3*  MCV 91.7 91.6  PLT 416* 098   Basic Metabolic Panel  Recent Labs  05/07/16 1736 05/08/16 0945  NA 136 137  K 3.9 4.2  CL 105  104  CO2 24 24  GLUCOSE 117* 103*  BUN 10 7  CREATININE 0.55 0.62  CALCIUM 9.1 8.2*   Liver Function Tests  Recent Labs  05/07/16 1736  AST 21  ALT 44  ALKPHOS 78  BILITOT 0.3  PROT 7.3  ALBUMIN 3.6    Recent Labs  05/07/16 1736  LIPASE 18   Cardiac Enzymes  Recent Labs  05/07/16 1736  TROPONINI <0.03    Recent Labs  05/08/16 0317  TSH 1.866    Radiology/Studies  Dg Chest 2 View  Result Date: 05/07/2016 CLINICAL DATA:   IMPRESSION: Mild enlargement of cardiac silhouette, possibly accentuated by technique. Increased interstitial markings, probably pulmonary vascular congestion.   Ct Abdomen Pelvis W Contrast  Result Date: 05/07/2016 CLINICAL DATA:  Upper abdominal pain, onset this morning.  IMPRESSION: 1. Moderate circumferential pericardial effusion measuring up to 15 mm, new. 2. Mild hepatosplenomegaly and hepatic steatosis. Periportal edema is nonspecific, can be seen with hydration status versus right heart failure, however there is no right heart dilatation on CT. 3. Aortic atherosclerosis. 4. 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   TELE: ST  ECG:   TTE: 05/08/2016 - Left  ventricle: The cavity size was normal. Systolic function was   normal. The estimated ejection fraction was in the range of 60%   to 65%. Wall motion was normal; there were no regional wall   motion abnormalities. Features are consistent with a pseudonormal   left ventricular filling pattern, with concomitant abnormal   relaxation and increased filling pressure (grade 2 diastolic   dysfunction). Doppler parameters are consistent with high   ventricular filling pressure. - Aortic valve: There was very mild stenosis. There was mild to   moderate regurgitation. Valve area (VTI): 1.2 cm^2. Valve area   (Vmax): 1.11 cm^2. Valve area (Vmean): 1.12 cm^2. - Pulmonic valve: There was trivial regurgitation. - Pericardium, extracardiac: A small,  free-flowing pericardial   effusion was identified along the left ventricular free wall. The   fluid had no internal echoes.There was no evidence of hemodynamic   compromise. There was no chamber collapse. Respirophasic change   in stroke volume was normal.    ASSESSMENT AND PLAN  50 yo F with cp improved by leaning forward, SOB, pericardial effusion, and negative Trops most consistent with acute pericarditis.   1) Acute pericarditis with effusion - unclear etiology, may be related to viral illness 3-4 weeks ago, but need to r/o other etiologies.  - Trops neg x 3 - ECG shows ST with nonspecific ST-T wave abnormalities - TTE shows normal LVEF, A small, free-flowing pericardial   effusion was identified along the left ventricular free wall. The   fluid had no internal echoes.There was no evidence of hemodynamic   compromise. There was no chamber collapse. - No signs of tamponade - we will give morphine for pain control   - CT as above - CBC, Chem 7, TSH, ESR - Given relatively young age, fevers, effusion, check ANA (needs to be ordered in the Am) Tx: - Toradol x 1, Ibuprofen 600 mg tid x 2 weeks, Omperazole 20 mg daily, Colchicine 0.6 mg b.i.d. x 3 months  #) Fevers Dx: - BCx x 2, CXR, UCx - pending  #) COPD - Continue inhailers  #) Chronic diastolic dysfunction  - appears euvolemic - Monitor volume status  #) DVT Ppx - Hep [place]  FULL CODE   Signed, Ena Dawley MD, [facility] 05/08/2016

## 2016-05-09 DIAGNOSIS — I119 Hypertensive heart disease without heart failure: Secondary | ICD-10-CM | POA: Diagnosis present

## 2016-05-09 DIAGNOSIS — J439 Emphysema, unspecified: Secondary | ICD-10-CM | POA: Diagnosis present

## 2016-05-09 DIAGNOSIS — D649 Anemia, unspecified: Secondary | ICD-10-CM | POA: Diagnosis present

## 2016-05-09 DIAGNOSIS — Z79899 Other long term (current) drug therapy: Secondary | ICD-10-CM | POA: Diagnosis not present

## 2016-05-09 DIAGNOSIS — I301 Infective pericarditis: Secondary | ICD-10-CM | POA: Diagnosis not present

## 2016-05-09 DIAGNOSIS — K219 Gastro-esophageal reflux disease without esophagitis: Secondary | ICD-10-CM | POA: Diagnosis present

## 2016-05-09 DIAGNOSIS — I313 Pericardial effusion (noninflammatory): Secondary | ICD-10-CM | POA: Diagnosis not present

## 2016-05-09 DIAGNOSIS — Z880 Allergy status to penicillin: Secondary | ICD-10-CM | POA: Diagnosis not present

## 2016-05-09 DIAGNOSIS — Z888 Allergy status to other drugs, medicaments and biological substances status: Secondary | ICD-10-CM | POA: Diagnosis not present

## 2016-05-09 DIAGNOSIS — R071 Chest pain on breathing: Secondary | ICD-10-CM | POA: Diagnosis not present

## 2016-05-09 DIAGNOSIS — Z9851 Tubal ligation status: Secondary | ICD-10-CM | POA: Diagnosis not present

## 2016-05-09 DIAGNOSIS — Z87891 Personal history of nicotine dependence: Secondary | ICD-10-CM | POA: Diagnosis not present

## 2016-05-09 DIAGNOSIS — E669 Obesity, unspecified: Secondary | ICD-10-CM | POA: Diagnosis present

## 2016-05-09 DIAGNOSIS — I309 Acute pericarditis, unspecified: Secondary | ICD-10-CM | POA: Diagnosis present

## 2016-05-09 DIAGNOSIS — F319 Bipolar disorder, unspecified: Secondary | ICD-10-CM | POA: Diagnosis present

## 2016-05-09 DIAGNOSIS — I300 Acute nonspecific idiopathic pericarditis: Secondary | ICD-10-CM | POA: Diagnosis not present

## 2016-05-09 DIAGNOSIS — Z6841 Body Mass Index (BMI) 40.0 and over, adult: Secondary | ICD-10-CM | POA: Diagnosis not present

## 2016-05-09 LAB — URINE CULTURE: Special Requests: NORMAL

## 2016-05-09 LAB — HEMOGLOBIN A1C
HEMOGLOBIN A1C: 5.6 % (ref 4.8–5.6)
MEAN PLASMA GLUCOSE: 114 mg/dL

## 2016-05-09 NOTE — Progress Notes (Signed)
Patient Name: [name] Date of Encounter: 05/09/2016  Primary Cardiologist: Dr. Rhona Raider Problem List     Active Problems:   COPD (chronic obstructive pulmonary disease) with emphysema (HCC)   Chest pain   Pericardial effusion   Fever     Subjective   Pain consistent with acute pericarditis  fever yesterday. Right-sided headache.  Inpatient Medications    Scheduled Meds: . clonazePAM  0.5 mg Oral QHS  . colchicine  0.6 mg Oral BID  . heparin  5,000 Units Subcutaneous Q8H  . ibuprofen  600 mg Oral TID WC  . Influenza vac split quadrivalent PF  0.5 mL Intramuscular Tomorrow-1000  . loratadine  10 mg Oral Daily  . pantoprazole  40 mg Oral Daily  . sodium chloride flush  3 mL Intravenous Q12H  . tiotropium  18 mcg Inhalation Daily   Continuous Infusions:  PRN Meds: acetaminophen, albuterol, ketorolac, oxyCODONE, polyvinyl alcohol   Vital Signs    Vitals:   05/08/16 0500 05/08/16 1316 05/08/16 1956 05/09/16 0346  BP: (!) 110/49 119/61 123/62 (!) 141/56  Pulse: 81 87 (!) 109 (!) 101  Resp: 18 17 20 20   Temp: 98.7 F (37.1 C) 98.4 F (36.9 C) (!) 101.8 F (38.8 C) 98.6 F (37 C)  TempSrc: Oral Oral Oral Oral  SpO2: 99% 98% 99% 97%  Weight:    230 lb 11.2 oz (104.6 kg)  Height:        Intake/Output Summary (Last 24 hours) at 05/09/16 0942 Last data filed at 05/09/16 0300  Gross per 24 hour  Intake             1050 ml  Output                0 ml  Net             1050 ml   Filed Weights   05/07/16 1716 05/08/16 0043 05/09/16 0346  Weight: 225 lb (102.1 kg) 228 lb 8 oz (103.6 kg) 230 lb 11.2 oz (104.6 kg)    Physical Exam    GEN: Well nourished, well developed, in no acute distress.  HEENT: Grossly normal.  Neck: Supple, no JVD, carotid bruits, or masses. Cardiac: RRR, 1/6 systolic murmur, no rubs, or gallops. No clubbing, cyanosis, edema.  Radials/DP/PT 2+ and equal bilaterally.  Respiratory:  Respirations regular and unlabored,  clear to auscultation bilaterally. GI: Soft, nontender, nondistended, BS + x 4. MS: no deformity or atrophy. Skin: warm and dry, no rash. Tattoos noted around neck Neuro:  Strength and sensation are intact. Psych: AAOx3.  Normal affect.  Labs    CBC  Recent Labs  05/07/16 1736 05/08/16 0945  WBC 14.3* 9.2  NEUTROABS 11.8*  --   HGB 11.3* 9.0*  HCT 34.1* 28.3*  MCV 91.7 91.6  PLT 416* 123XX123   Basic Metabolic Panel  Recent Labs  05/07/16 1736 05/08/16 0945  NA 136 137  K 3.9 4.2  CL 105 104  CO2 24 24  GLUCOSE 117* 103*  BUN 10 7  CREATININE 0.55 0.62  CALCIUM 9.1 8.2*   Liver Function Tests  Recent Labs  05/07/16 1736  AST 21  ALT 44  ALKPHOS 78  BILITOT 0.3  PROT 7.3  ALBUMIN 3.6    Recent Labs  05/07/16 1736  LIPASE 18   Cardiac Enzymes  Recent Labs  05/07/16 1736  TROPONINI <0.03   BNP Invalid input(s): POCBNP D-Dimer No results for input(s): DDIMER in  the last 72 hours. Hemoglobin A1C  Recent Labs  05/08/16 0317  HGBA1C 5.6   Fasting Lipid Panel No results for input(s): CHOL, HDL, LDLCALC, TRIG, CHOLHDL, LDLDIRECT in the last 72 hours. Thyroid Function Tests  Recent Labs  05/08/16 0317  TSH 1.866    Telemetry    No adverse arrhythmias - Personally Reviewed  ECG     Sinus tachycardia- Personally Reviewed  Radiology    Dg Chest 2 View  Result Date: 05/07/2016 CLINICAL DATA:  50 y/o F; upper abdominal pain in central chest pain radiating to the left shoulder. 3 weeks of cough. EXAM: CHEST  2 VIEW COMPARISON:  04/29/2016 chest radiograph. 04/05/2004 chest radiograph. FINDINGS: Mild enlargement of the cardiac silhouette, possibly accentuated by technique. Increased interstitial markings may represent pulmonary vascular congestion. No focal consolidation. No pleural effusion. Bones are unremarkable. Left proximal humerus sclerotic focus seen in 2005, probably a cartilaginous rest. IMPRESSION: Mild enlargement of cardiac  silhouette, possibly accentuated by technique. Increased interstitial markings, probably pulmonary vascular congestion. Electronically Signed   By: Kristine Garbe M.D.   On: 05/07/2016 19:55   Ct Abdomen Pelvis W Contrast  Result Date: 05/07/2016 CLINICAL DATA:  Upper abdominal pain, onset this morning. EXAM: CT ABDOMEN AND PELVIS WITH CONTRAST TECHNIQUE: Multidetector CT imaging of the abdomen and pelvis was performed using the standard protocol following bolus administration of intravenous contrast. CONTRAST:  119mL ISOVUE-300 IOPAMIDOL (ISOVUE-300) COMPARISON:  CT abdomen/ pelvis 02/12/2015, chest radiographs earlier this day. FINDINGS: Lower chest: Moderate circumferential pericardial effusion measures up to 15 mm in depth. Trace left pleural effusion. Lingular atelectasis. Hepatobiliary: Liver is prominent in size spanning 19.6 cm cranial caudal with borderline mild hepatic steatosis. No focal hepatic lesion. There is mild periportal edema. Gallbladder is distended, no calcified stone. No biliary dilatation. Pancreas: No ductal dilatation or inflammation. Spleen: Mildly enlarged measuring 15.2 x 5.2 x 11.3 cm (volume = 470 cm^3). No focal splenic abnormality. Adrenals/Urinary Tract: No adrenal nodule. Symmetric renal enhancement and excretion. No hydronephrosis. Trace perinephric edema about the lower right kidney. Ureters are decompressed. Urinary bladder is minimally distended. Stomach/Bowel: Stomach is physiologically distended. No small bowel dilatation or inflammation. The appendix is normal. There is an incidental 11 mm fat density lesion in the mid transverse colon, consistent with mural lipoma. No obstruction. This is unchanged from prior exam. Vascular/Lymphatic: Mild atherosclerosis without aneurysm. The scattered small retroperitoneal lymph nodes. Reproductive: Uterus is bulbous in appearance, possible underlying fibroids. No adnexal mass. Ovaries are quiescent. Other: No ascites or  free air. Musculoskeletal: There are no acute or suspicious osseous abnormalities. Degenerative change in the spine with mild degenerative disc disease and facet arthropathy. Hemi transitional lumbosacral anatomy. IMPRESSION: 1. Moderate circumferential pericardial effusion measuring up to 15 mm, new. 2. Mild hepatosplenomegaly and hepatic steatosis. Periportal edema is nonspecific, can be seen with hydration status versus right heart failure, however there is no right heart dilatation on CT. 3. Aortic atherosclerosis. 4. 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 Electronically Signed   By: Jeb Levering M.D.   On: 05/07/2016 20:15    Cardiac Studies   Echocardiogram 05/08/16 - Left ventricle: The cavity size was normal. Systolic function was   normal. The estimated ejection fraction was in the range of 60%   to 65%. Wall motion was normal; there were no regional wall   motion abnormalities. Features are consistent with a pseudonormal   left ventricular filling pattern, with concomitant abnormal   relaxation  and increased filling pressure (grade 2 diastolic   dysfunction). Doppler parameters are consistent with high   ventricular filling pressure. - Aortic valve: There was very mild stenosis. There was mild to   moderate regurgitation. Valve area (VTI): 1.2 cm^2. Valve area   (Vmax): 1.11 cm^2. Valve area (Vmean): 1.12 cm^2. - Pulmonic valve: There was trivial regurgitation. - Pericardium, extracardiac: A small, free-flowing pericardial   effusion was identified along the left ventricular free wall. The   fluid had no internal echoes.There was no evidence of hemodynamic   compromise. There was no chamber collapse. Respirophasic change   in stroke volume was normal.   Patient Profile     49 year old with acute pericarditis, pleuritic pain with recent pericarditis episode in late November.  Assessment & Plan    Acute pericarditis  - Agree with  anti-inflammatories, ibuprofen, colchicine, Toradol  - Mild effusion noted, no signs of tamponade.  - Fever  - CRP 27, sedimentation rate 100, TSH normal 1.8, ANA pending  - Pain, trying to use anti-inflammatories instead of narcotics  - Encourage up to chair  Fever  - Blood cultures 2, chest x-ray, urine culture  - Anti-inflammatories. No antibiotics at this point.  - Likely related to pericarditis.  COPD  - Inhalers  Anemia  - Question drop in hemoglobin. No source of bleeding.  - Repeat CBC in a.m.  Obesity  - Encourage weight loss  Signed, Candee Furbish, MD  05/09/2016, 9:42 AM

## 2016-05-10 ENCOUNTER — Encounter (HOSPITAL_COMMUNITY): Payer: Self-pay | Admitting: Physician Assistant

## 2016-05-10 DIAGNOSIS — R071 Chest pain on breathing: Secondary | ICD-10-CM

## 2016-05-10 DIAGNOSIS — I309 Acute pericarditis, unspecified: Secondary | ICD-10-CM | POA: Diagnosis present

## 2016-05-10 HISTORY — DX: Acute pericarditis, unspecified: I30.9

## 2016-05-10 MED ORDER — IBUPROFEN 600 MG PO TABS: 600.0000 mg | ORAL_TABLET | Freq: Three times a day (TID) | ORAL | 0 refills | Status: DC

## 2016-05-10 MED ORDER — OMEPRAZOLE 20 MG PO CPDR: 20.0000 mg | DELAYED_RELEASE_CAPSULE | Freq: Every day | ORAL | Status: DC

## 2016-05-10 MED ORDER — COLCHICINE 0.6 MG PO TABS: 0.6000 mg | ORAL_TABLET | Freq: Two times a day (BID) | ORAL | 3 refills | Status: DC

## 2016-05-10 NOTE — Discharge Summary (Signed)
Discharge Summary    Patient ID: [name],  MRN: [id number], DOB/AGE: 1966-02-08 50 y.o.  Admit date: 05/07/2016 Discharge date: 05/10/2016  Primary Care Provider: Sallee Lange Primary Cardiologist: Dr Meda Coffee  Discharge Diagnoses    Principal Problem:   Acute pericarditis Active Problems:   COPD (chronic obstructive pulmonary disease) with emphysema (HCC)   Chest pain   Pericardial effusion   Fever   Allergies Allergies  Allergen Reactions  . Gabapentin Hives  . Augmentin [Amoxicillin-Pot Clavulanate] Nausea And Vomiting    Diagnostic Studies/Procedures    ECHO: 05/08/2016 - Left ventricle: The cavity size was normal. Systolic function was normal. The estimated ejection fraction was in the range of 60% to 65%. Wall motion was normal; there were no regional wall motion abnormalities. Features are consistent with a pseudonormal left ventricular filling pattern, with concomitant abnormal relaxation and increased filling pressure (grade 2 diastolic dysfunction). Doppler parameters are consistent with high ventricular filling pressure. - Aortic valve: There was very mild stenosis. There was mild to   moderate regurgitation. Valve area (VTI): 1.2 cm^2. Valve area   (Vmax): 1.11 cm^2. Valve area (Vmean): 1.12 cm^2. - Pulmonic valve: There was trivial regurgitation. - Pericardium, extracardiac: A small, free-flowing pericardial   effusion was identified along the left ventricular free wall. The   fluid had no internal echoes.There was no evidence of hemodynamic compromise. There was no chamber collapse. Respirophasic change in stroke volume was normal. _____________   History of Present Illness     [name] is a 50 yo F with a h/o ADD, bipolar 1 disorder, COPD, depression, GERD, hypertension, impaired fasting glucose, tobacco use disorder.   Recently admitted from 04/29/2016-04/30/2016 for precordial cp that radiates to her neck, left arm and jaw, worsened  by lying down, improved by sitting up, worsened by deep inspiration and cough. Her H&P notes the pain was relieved by Ibuprofen 800 mg prn. Of note, at that time, the patient was c/o 2-3 weeks of mild sore throat and rhinorrhea. Studies at that time were neg Trops, Echo with moderate LVH, mild AS/AR, EF [id number], grade 2 diastolic dysfunction. There was no pericardial effusion seen.  Pt returned to [facility] ER 12/07 complaining of abdominal pain and chest  pressure which was debilitating and prevented her from working, CT with moderate pericardial effusion, pt transferred to [facility].  Hospital Course     Consultants: None   She was felt to have acute pericarditis with an effusion. Unclear etiology, may be related to viral illness 3-4 weeks ago, but need to r/o other etiologies. Notably, it was not felt to be an acute Type A dissection that has propagated proximally.  She was febrile up to 101.56F. ANA and blood Cx x 2 were performed and are pending. A urine culture was performed and showed multiple species, re-collect recommended. However, she was not complaining of dysuria so it was felt negative. HIV was nonreactive, hemoglobin A1c was within normal limits, as was a TSH. Her sed rate was elevated at 100 and her white count was elevated as well, lending credence to an inflammatory etiology.  Her troponin was negative. A repeat echocardiogram was performed and there was no sign of tamponade. Results are above. She was given Toradol in the hospital and then started on ibuprofen and colchicine. Omeprazole was started for GI protection.  Her echocardiogram showed diastolic dysfunction, but she was euvolemic.  Her hemoglobin decreased during her hospital stay and this is felt secondary to blood draws.  This is to be followed as an outpatient. There was no additional source of blood loss known. She is to get a CBC at her follow-up office visit.  She had been on lisinopril 5 mg for blood pressure control prior to  admission. This was held on admission because her blood pressure was borderline. She had improvement in her blood pressure and by discharge her blood pressure was normal. However, it was felt that the lisinopril should be held for now and resumed as an outpatient he recovered more fully. She is off work until January 2, if she will not be ready to return to work by then, she will need a work note.   On 12/10, she was seen by Dr. Radford Pax and all data were reviewed. Her pain had improved her white count had normalized. Dr. Radford Pax felt no further cardiac workup was indicated and she is considered stable for discharge, to follow up in the office. _____________  Discharge Vitals Blood pressure 132/70, pulse 93, temperature 98.6 F (37 C), temperature source Oral, resp. rate 18, height 5\' 3"  (1.6 m), weight 230 lb 11.2 oz (104.6 kg), last menstrual period 03/18/2013, SpO2 97 %.  Filed Weights   05/07/16 1716 05/08/16 0043 05/09/16 0346  Weight: 225 lb (102.1 kg) 228 lb 8 oz (103.6 kg) 230 lb 11.2 oz (104.6 kg)    Labs & Radiologic Studies    CBC  Recent Labs  05/07/16 1736 05/08/16 0945  WBC 14.3* 9.2  NEUTROABS 11.8*  --   HGB 11.3* 9.0*  HCT 34.1* 28.3*  MCV 91.7 91.6  PLT 416* 123XX123   Basic Metabolic Panel  Recent Labs  05/07/16 1736 05/08/16 0945  NA 136 137  K 3.9 4.2  CL 105 104  CO2 24 24  GLUCOSE 117* 103*  BUN 10 7  CREATININE 0.55 0.62  CALCIUM 9.1 8.2*   Liver Function Tests  Recent Labs  05/07/16 1736  AST 21  ALT 44  ALKPHOS 78  BILITOT 0.3  PROT 7.3  ALBUMIN 3.6    Recent Labs  05/07/16 1736  LIPASE 18   Cardiac Enzymes  Recent Labs  05/07/16 1736  TROPONINI <0.03   BNP B Natriuretic Peptide  Date Value Ref Range Status  05/07/2016 77.0 0.0 - 100.0 pg/mL Final   Hemoglobin A1C  Recent Labs  05/08/16 0317  HGBA1C 5.6   Lab Results  Component Value Date   ESRSEDRATE 100 (H) 05/07/2016   Thyroid Function Tests  Recent Labs   05/08/16 0317  TSH 1.866   _____________  Dg Chest 2 View Result Date: 05/07/2016 CLINICAL DATA:  50 y/o F; upper abdominal pain in central chest pain radiating to the left shoulder. 3 weeks of cough. EXAM: CHEST  2 VIEW COMPARISON:  04/29/2016 chest radiograph. 04/05/2004 chest radiograph. FINDINGS: Mild enlargement of the cardiac silhouette, possibly accentuated by technique. Increased interstitial markings may represent pulmonary vascular congestion. No focal consolidation. No pleural effusion. Bones are unremarkable. Left proximal humerus sclerotic focus seen in 2005, probably a cartilaginous rest. IMPRESSION: Mild enlargement of cardiac silhouette, possibly accentuated by technique. Increased interstitial markings, probably pulmonary vascular congestion. Electronically Signed   By: Kristine Garbe M.D.   On: 05/07/2016 19:55   Ct Abdomen Pelvis W Contrast Result Date: 05/07/2016 CLINICAL DATA:  Upper abdominal pain, onset this morning. EXAM: CT ABDOMEN AND PELVIS WITH CONTRAST TECHNIQUE: Multidetector CT imaging of the abdomen and pelvis was performed using the standard protocol following bolus administration of intravenous contrast. CONTRAST:  128mL ISOVUE-300 IOPAMIDOL (ISOVUE-300) COMPARISON:  CT abdomen/ pelvis 02/12/2015, chest radiographs earlier this day. FINDINGS: Lower chest: Moderate circumferential pericardial effusion measures up to 15 mm in depth. Trace left pleural effusion. Lingular atelectasis. Hepatobiliary: Liver is prominent in size spanning 19.6 cm cranial caudal with borderline mild hepatic steatosis. No focal hepatic lesion. There is mild periportal edema. Gallbladder is distended, no calcified stone. No biliary dilatation. Pancreas: No ductal dilatation or inflammation. Spleen: Mildly enlarged measuring 15.2 x 5.2 x 11.3 cm (volume = 470 cm^3). No focal splenic abnormality. Adrenals/Urinary Tract: No adrenal nodule. Symmetric renal enhancement and excretion. No  hydronephrosis. Trace perinephric edema about the lower right kidney. Ureters are decompressed. Urinary bladder is minimally distended. Stomach/Bowel: Stomach is physiologically distended. No small bowel dilatation or inflammation. The appendix is normal. There is an incidental 11 mm fat density lesion in the mid transverse colon, consistent with mural lipoma. No obstruction. This is unchanged from prior exam. Vascular/Lymphatic: Mild atherosclerosis without aneurysm. The scattered small retroperitoneal lymph nodes. Reproductive: Uterus is bulbous in appearance, possible underlying fibroids. No adnexal mass. Ovaries are quiescent. Other: No ascites or free air. Musculoskeletal: There are no acute or suspicious osseous abnormalities. Degenerative change in the spine with mild degenerative disc disease and facet arthropathy. Hemi transitional lumbosacral anatomy. IMPRESSION: 1. Moderate circumferential pericardial effusion measuring up to 15 mm, new. 2. Mild hepatosplenomegaly and hepatic steatosis. Periportal edema is nonspecific, can be seen with hydration status versus right heart failure, however there is no right heart dilatation on CT. 3. Aortic atherosclerosis. 4. 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 Electronically Signed   By: Jeb Levering M.D.   On: 05/07/2016 20:15   Disposition   Pt is being discharged home today in good condition.  Follow-up Plans & Appointments    Follow-up Information    Ena Dawley, MD Follow up.   Specialty:  Cardiology Why:  The office will call.  Contact information: [address] [place] [id number] [phone]          Discharge Instructions    Diet - low sodium heart healthy    Complete by:  As directed    Increase activity slowly    Complete by:  As directed       Discharge Medications   Current Discharge Medication List    START taking these medications   Details    colchicine 0.6 MG tablet Take 1 tablet (0.6 mg total) by mouth 2 (two) times daily. Qty: 60 tablet, Refills: 3    ibuprofen (ADVIL,MOTRIN) 600 MG tablet Take 1 tablet (600 mg total) by mouth 3 (three) times daily with meals. Qty: 50 tablet, Refills: 0    omeprazole (PRILOSEC) 20 MG capsule Take 1 capsule (20 mg total) by mouth daily.      CONTINUE these medications which have NOT CHANGED   Details  albuterol (PROAIR HFA) 108 (90 Base) MCG/ACT inhaler Inhale 2 puffs into the lungs every 6 (six) hours as needed for wheezing or shortness of breath. Qty: 1 Inhaler, Refills: 5    clonazePAM (KLONOPIN) 0.5 MG tablet Take 1 tablet by mouth at bedtime.    loratadine (CLARITIN) 10 MG tablet Take 10 mg by mouth every morning.    polyvinyl alcohol (LIQUIFILM TEARS) 1.4 % ophthalmic solution Place 2 drops into both eyes as needed for dry eyes.    tiotropium (SPIRIVA HANDIHALER) 18 MCG inhalation capsule INHALE THE CONTENTS OF 1 CAPSULE VIA HANDIHALER EVERY  DAY Qty: 30 capsule, Refills: 5      STOP taking these medications     lisinopril (PRINIVIL,ZESTRIL) 5 MG tablet           Outstanding Labs/Studies   Antinuclear antibody pending, blood cultures 2, negative so far but incomplete. Sputum culture was ordered but never performed  Duration of Discharge Encounter   Greater than 30 minutes including physician time.  Jonetta Speak NP 05/10/2016, 3:29 PM

## 2016-05-10 NOTE — Progress Notes (Signed)
Discharged to home with family office visits in place teaching done  

## 2016-05-10 NOTE — Progress Notes (Addendum)
Patient Name: [name] Date of Encounter: 05/10/2016  Primary Cardiologist: Dr. Rhona Raider Problem List     Active Problems:   COPD (chronic obstructive pulmonary disease) with emphysema (HCC)   Chest pain   Pericardial effusion   Fever     Subjective   Pain consistent with acute pericarditis.  Afebrile.  Pain much improved.    Inpatient Medications    Scheduled Meds: . clonazePAM  0.5 mg Oral QHS  . colchicine  0.6 mg Oral BID  . heparin  5,000 Units Subcutaneous Q8H  . ibuprofen  600 mg Oral TID WC  . Influenza vac split quadrivalent PF  0.5 mL Intramuscular Tomorrow-1000  . loratadine  10 mg Oral Daily  . pantoprazole  40 mg Oral Daily  . sodium chloride flush  3 mL Intravenous Q12H  . tiotropium  18 mcg Inhalation Daily   Continuous Infusions:  PRN Meds: acetaminophen, albuterol, ketorolac, oxyCODONE, polyvinyl alcohol   Vital Signs    Vitals:   05/09/16 1802 05/09/16 2017 05/10/16 0524 05/10/16 0926  BP: (!) 120/58 137/72 132/70   Pulse: 86 84 93   Resp: 18 18 18    Temp:  98 F (36.7 C) 98.6 F (37 C)   TempSrc:  Oral Oral   SpO2: 99% 100% 97% 97%  Weight:      Height:       No intake or output data in the 24 hours ending 05/10/16 0958 Filed Weights   05/07/16 1716 05/08/16 0043 05/09/16 0346  Weight: 225 lb (102.1 kg) 228 lb 8 oz (103.6 kg) 230 lb 11.2 oz (104.6 kg)    Physical Exam    GEN: Well nourished, well developed, in no acute distress.  HEENT: Grossly normal.  Neck: Supple, no JVD, carotid bruits, or masses. Cardiac: RRR, 1/6 systolic murmur, no rubs, or gallops. No clubbing, cyanosis, edema.  Radials/DP/PT 2+ and equal bilaterally.  Respiratory:  Respirations regular and unlabored, clear to auscultation bilaterally. GI: Soft, nontender, nondistended, BS + x 4. MS: no deformity or atrophy. Skin: warm and dry, no rash. Tattoos noted around neck Neuro:  Strength and sensation are intact. Psych: AAOx3.  Normal  affect.  Labs    CBC  Recent Labs  05/07/16 1736 05/08/16 0945  WBC 14.3* 9.2  NEUTROABS 11.8*  --   HGB 11.3* 9.0*  HCT 34.1* 28.3*  MCV 91.7 91.6  PLT 416* [id number]   Basic Metabolic Panel  Recent Labs  05/07/16 1736 05/08/16 0945  NA 136 137  K 3.9 4.2  CL 105 104  CO2 24 24  GLUCOSE 117* 103*  BUN 10 7  CREATININE 0.55 0.62  CALCIUM 9.1 8.2*   Liver Function Tests  Recent Labs  05/07/16 1736  AST 21  ALT 44  ALKPHOS 78  BILITOT 0.3  PROT 7.3  ALBUMIN 3.6    Recent Labs  05/07/16 1736  LIPASE 18   Cardiac Enzymes  Recent Labs  05/07/16 1736  TROPONINI <0.03   BNP Invalid input(s): POCBNP D-Dimer No results for input(s): DDIMER in the last 72 hours. Hemoglobin A1C  Recent Labs  05/08/16 0317  HGBA1C 5.6   Fasting Lipid Panel No results for input(s): CHOL, HDL, LDLCALC, TRIG, CHOLHDL, LDLDIRECT in the last 72 hours. Thyroid Function Tests  Recent Labs  05/08/16 0317  TSH 1.866    Telemetry    No adverse arrhythmias - Personally Reviewed  ECG     Sinus tachycardia- Personally Reviewed  Radiology  No results found.  Cardiac Studies   Echocardiogram 05/08/16 - Left ventricle: The cavity size was normal. Systolic function was   normal. The estimated ejection fraction was in the range of 60%   to 65%. Wall motion was normal; there were no regional wall   motion abnormalities. Features are consistent with a pseudonormal   left ventricular filling pattern, with concomitant abnormal   relaxation and increased filling pressure (grade 2 diastolic   dysfunction). Doppler parameters are consistent with high   ventricular filling pressure. - Aortic valve: There was very mild stenosis. There was mild to   moderate regurgitation. Valve area (VTI): 1.2 cm^2. Valve area   (Vmax): 1.11 cm^2. Valve area (Vmean): 1.12 cm^2. - Pulmonic valve: There was trivial regurgitation. - Pericardium, extracardiac: A small, free-flowing  pericardial   effusion was identified along the left ventricular free wall. The   fluid had no internal echoes.There was no evidence of hemodynamic   compromise. There was no chamber collapse. Respirophasic change   in stroke volume was normal.   Patient Profile     50 year old with acute pericarditis, pleuritic pain with recent pericarditis episode in late November.  Assessment & Plan    Acute pericarditis  - Agree with anti-inflammatories, ibuprofen, colchicine  - Mild effusion noted, no signs of tamponade.  - Fever has resolved  - CRP 27, sedimentation rate 100, TSH normal 1.8, ANA pending  Fever  - Blood cultures 2, chest x-ray, urine culture  - Anti-inflammatories. No antibiotics at this point.  - Likely related to pericarditis and fever has resolved.  WBC trending downward.  COPD  - Inhalers  Anemia  - Question drop in hemoglobin. No source of bleeding.  - Repeat CBC in a.m.  Obesity  - Encourage weight loss  Patient stable from a cardiac standpoint for discharge home.  Continue colchicine and NSAIDs for now.  Will arrange followup in our office for 1 week.  Signed, Fransico Him, MD  05/10/2016, 9:58 AM

## 2016-05-11 ENCOUNTER — Encounter: Payer: Self-pay | Admitting: Internal Medicine

## 2016-05-11 ENCOUNTER — Telehealth: Payer: Self-pay | Admitting: *Deleted

## 2016-05-11 LAB — ANTINUCLEAR ANTIBODIES, IFA: ANTINUCLEAR ANTIBODIES, IFA: NEGATIVE

## 2016-05-11 NOTE — Telephone Encounter (Signed)
Appoint made. For 05/18/16 with Truitt Merle NP for TCM follow-up.

## 2016-05-11 NOTE — Telephone Encounter (Signed)
Patient contacted regarding discharge from [facility] on .  Patient understands to follow up with provider Truitt Merle, NP on 12/18 at 3 pm at [facility]. Patient understands discharge instructions? yes Patient understands medications and regiment? yes Patient understands to bring all medications to this visit? yes  Pt aware of appt date, time, location - picked all RXs up today and has no questions r/t them.

## 2016-05-11 NOTE — Telephone Encounter (Signed)
-----   Message from [place], [place] sent at 05/10/2016 10:14 AM EST ----- Please schedule this patient for a follow-up appointment.  Primary Cardiologist: Dr. Meda Coffee Date of Discharge: 05/10/2016 Appointment Needed Within: 7 days  Appointment Type: TOC  Thank you! Robbie Lis, PA-C

## 2016-05-12 ENCOUNTER — Ambulatory Visit: Payer: Federal, State, Local not specified - PPO | Admitting: Allergy & Immunology

## 2016-05-13 ENCOUNTER — Emergency Department (HOSPITAL_COMMUNITY): Payer: Federal, State, Local not specified - PPO

## 2016-05-13 ENCOUNTER — Observation Stay (HOSPITAL_COMMUNITY)
Admission: EM | Admit: 2016-05-13 | Discharge: 2016-05-14 | Disposition: A | Discharge status: Home / Self Care | Payer: Federal, State, Local not specified - PPO | Attending: Internal Medicine | Admitting: Internal Medicine

## 2016-05-13 ENCOUNTER — Encounter (HOSPITAL_COMMUNITY): Payer: Self-pay

## 2016-05-13 DIAGNOSIS — I309 Acute pericarditis, unspecified: Principal | ICD-10-CM

## 2016-05-13 DIAGNOSIS — F909 Attention-deficit hyperactivity disorder, unspecified type: Secondary | ICD-10-CM | POA: Diagnosis not present

## 2016-05-13 DIAGNOSIS — J449 Chronic obstructive pulmonary disease, unspecified: Secondary | ICD-10-CM | POA: Diagnosis not present

## 2016-05-13 DIAGNOSIS — R160 Hepatomegaly, not elsewhere classified: Secondary | ICD-10-CM

## 2016-05-13 DIAGNOSIS — F17210 Nicotine dependence, cigarettes, uncomplicated: Secondary | ICD-10-CM | POA: Insufficient documentation

## 2016-05-13 DIAGNOSIS — I3139 Other pericardial effusion (noninflammatory): Secondary | ICD-10-CM | POA: Diagnosis present

## 2016-05-13 DIAGNOSIS — R0602 Shortness of breath: Secondary | ICD-10-CM | POA: Diagnosis not present

## 2016-05-13 DIAGNOSIS — M25511 Pain in right shoulder: Secondary | ICD-10-CM | POA: Diagnosis not present

## 2016-05-13 DIAGNOSIS — I319 Disease of pericardium, unspecified: Secondary | ICD-10-CM | POA: Diagnosis present

## 2016-05-13 DIAGNOSIS — R509 Fever, unspecified: Secondary | ICD-10-CM | POA: Diagnosis not present

## 2016-05-13 DIAGNOSIS — R52 Pain, unspecified: Secondary | ICD-10-CM | POA: Diagnosis not present

## 2016-05-13 DIAGNOSIS — R079 Chest pain, unspecified: Secondary | ICD-10-CM | POA: Diagnosis not present

## 2016-05-13 DIAGNOSIS — Z79899 Other long term (current) drug therapy: Secondary | ICD-10-CM | POA: Insufficient documentation

## 2016-05-13 DIAGNOSIS — F331 Major depressive disorder, recurrent, moderate: Secondary | ICD-10-CM | POA: Diagnosis not present

## 2016-05-13 DIAGNOSIS — I313 Pericardial effusion (noninflammatory): Secondary | ICD-10-CM | POA: Diagnosis present

## 2016-05-13 DIAGNOSIS — I10 Essential (primary) hypertension: Secondary | ICD-10-CM | POA: Diagnosis not present

## 2016-05-13 DIAGNOSIS — J439 Emphysema, unspecified: Secondary | ICD-10-CM | POA: Diagnosis present

## 2016-05-13 DIAGNOSIS — M5489 Other dorsalgia: Secondary | ICD-10-CM | POA: Diagnosis not present

## 2016-05-13 LAB — BASIC METABOLIC PANEL
ANION GAP: 9 (ref 5–15)
BUN: 12 mg/dL (ref 6–20)
CALCIUM: 9.1 mg/dL (ref 8.9–10.3)
CHLORIDE: 103 mmol/L (ref 101–111)
CO2: 25 mmol/L (ref 22–32)
CREATININE: 0.55 mg/dL (ref 0.44–1.00)
GFR calc non Af Amer: >60 mL/min (ref >60)
Glucose, Bld: 122 mg/dL — ABNORMAL HIGH (ref 65–99)
Potassium: 4.1 mmol/L (ref 3.5–5.1)
SODIUM: 137 mmol/L (ref 135–145)

## 2016-05-13 LAB — CBC
HCT: 33.7 % — ABNORMAL LOW (ref 36.0–46.0)
HEMOGLOBIN: 10.9 g/dL — AB (ref 12.0–15.0)
MCH: 29.3 pg (ref 26.0–34.0)
MCHC: 32.3 g/dL (ref 30.0–36.0)
MCV: 90.6 fL (ref 78.0–100.0)
PLATELETS: 564 10*3/uL — AB (ref 150–400)
RBC: 3.72 MIL/uL — AB (ref 3.87–5.11)
RDW: 13.6 % (ref 11.5–15.5)
WBC: 13.8 10*3/uL — AB (ref 4.0–10.5)

## 2016-05-13 LAB — TROPONIN I

## 2016-05-13 LAB — CULTURE, BLOOD (ROUTINE X 2)
Culture: NO GROWTH
Culture: NO GROWTH

## 2016-05-13 LAB — LACTIC ACID, PLASMA: LACTIC ACID, VENOUS: 1 mmol/L (ref 0.5–1.9)

## 2016-05-13 MED ORDER — MORPHINE SULFATE (PF) 4 MG/ML IV SOLN
4.0000 mg | Freq: Once | INTRAVENOUS | Status: AC
  Administered 2016-05-13: 4 mg via INTRAVENOUS
  Filled 2016-05-13: qty 1

## 2016-05-13 MED ORDER — KETOROLAC TROMETHAMINE 30 MG/ML IJ SOLN
30.0000 mg | Freq: Once | INTRAMUSCULAR | Status: AC
  Administered 2016-05-13: 30 mg via INTRAVENOUS
  Filled 2016-05-13: qty 1

## 2016-05-13 MED ORDER — ACETAMINOPHEN 500 MG PO TABS
1000.0000 mg | ORAL_TABLET | Freq: Once | ORAL | Status: AC
  Administered 2016-05-13: 1000 mg via ORAL
  Filled 2016-05-13: qty 2

## 2016-05-13 MED ORDER — IOPAMIDOL (ISOVUE-370) INJECTION 76%
100.0000 mL | Freq: Once | INTRAVENOUS | Status: AC | PRN
  Administered 2016-05-14: 100 mL via INTRAVENOUS

## 2016-05-13 NOTE — ED Triage Notes (Signed)
Patient states that she is having pain in the center of her chest radiating into her back.  Was here for the same thing last Thursday and transferred to [facility] with pericarditis.  Was discharged home from [facility] on Sunday.

## 2016-05-13 NOTE — ED Provider Notes (Signed)
[facility] DEPT Provider Note   CSN: [id number] Arrival date & time: 05/13/16  2215 By signing my name below, I, [name], attest that this documentation has been prepared under the direction and in the presence of Ezequiel Essex, MD. Electronically Signed: Georgette Alvarado, ED Scribe. 05/13/16. 11:39 PM.  History   Chief Complaint Chief Complaint  Patient presents with  . Chest Pain   HPI Comments: [name] is a 50 y.o. female with h/o acute pericarditis, COPD, GERD, HTN, and substance abuse who presents to the Emergency Department complaining of centralized chest pain radiating across her back onset last night with associated shortness of breath and nonproductive cough. Pt states the pain initially began two days ago but from shoulder to shoulder; it worsened last night with the pain gradually moving to her chest, waking her from her sleep. Pain is exacerbated with lying flat and deep breathing, and improved with leaning forward. She has not tried any OTC medications PTA. Pt was seen here for the same symptoms 6 days ago and transferred to [facility] with pericarditis. Pt was discharged 4 days ago from [facility] with pain still present which she was told would be normal; however, she notes that the pain at this time is significantly worse than when she was discharged. She was prescribed colchicine and naproxen and has been taking it as directed. She notes that this pain is somewhat consistent with her pericarditis but reports that the pain associated with pericarditis did not begin at her shoulders. Pt's first stress test will be 05/18/16. She denies h/o MIs and has no stents in place. Pt denies fever, chills, leg swelling, vomiting, or any other associated symptoms.   The history is provided by the patient. No language interpreter was used.    Past Medical History:  Diagnosis Date  . Acute pericarditis 05/10/2016  . ADD (attention deficit disorder)   . Bipolar 1 disorder ([place])   . COPD  (chronic obstructive pulmonary disease) ([place])   . Depression   . GERD (gastroesophageal reflux disease)   . Hypertension   . IFG (impaired fasting glucose)   . Substance abuse     Patient Active Problem List   Diagnosis Date Noted  . Acute pericarditis 05/10/2016  . Fever 05/08/2016  . Pericardial effusion 05/07/2016  . Diastolic dysfunction [id number]  . Chest pain 04/29/2016  . Hypokalemia 04/29/2016  . Tobacco use disorder 04/29/2016  . COPD (chronic obstructive pulmonary disease) with emphysema ([place]) 12/20/2015  . CARPAL TUNNEL SYNDROME 04/16/2008  . NUMBNESS/TINGLING 04/16/2008    Past Surgical History:  Procedure Laterality Date  . ORTHOPEDIC SURGERY    . TUBAL LIGATION      OB History    Gravida Para Term Preterm AB Living   2 2 2          SAB TAB Ectopic Multiple Live Births                   Home Medications    Prior to Admission medications   Medication Sig Start Date End Date Taking? Authorizing Provider  albuterol (PROAIR HFA) 108 (90 Base) MCG/ACT inhaler Inhale 2 puffs into the lungs every 6 (six) hours as needed for wheezing or shortness of breath. 04/15/16   Mikey Kirschner, MD  clonazePAM (KLONOPIN) 0.5 MG tablet Take 1 tablet by mouth at bedtime. 03/16/16   Historical Provider, MD  colchicine 0.6 MG tablet Take 1 tablet (0.6 mg total) by mouth 2 (two) times daily. 05/10/16   Suanne Marker  G Barrett, PA-C  ibuprofen (ADVIL,MOTRIN) 600 MG tablet Take 1 tablet (600 mg total) by mouth 3 (three) times daily with meals. 05/10/16   Rhonda G Barrett, PA-C  loratadine (CLARITIN) 10 MG tablet Take 10 mg by mouth every morning.    Historical Provider, MD  omeprazole (PRILOSEC) 20 MG capsule Take 1 capsule (20 mg total) by mouth daily. 05/10/16   Rhonda G Barrett, PA-C  polyvinyl alcohol (LIQUIFILM TEARS) 1.4 % ophthalmic solution Place 2 drops into both eyes as needed for dry eyes.    Historical Provider, MD  tiotropium (SPIRIVA HANDIHALER) 18 MCG inhalation capsule  INHALE THE CONTENTS OF 1 CAPSULE VIA HANDIHALER EVERY DAY 12/20/15   Kathyrn Drown, MD    Family History Family History  Problem Relation Age of Onset  . Atrial fibrillation Mother     Social History Social History  Substance Use Topics  . Smoking status: Former Smoker    Packs/day: 1.00    Types: Cigarettes    Quit date: 04/01/2016  . Smokeless tobacco: Never Used  . Alcohol use No     Allergies   Gabapentin and Augmentin [amoxicillin-pot clavulanate]   Review of Systems Review of Systems 10 Systems reviewed and all are negative for acute change except as noted in the HPI. Physical Exam Updated Vital Signs BP 156/76 (BP Location: Right Arm)   Pulse 112   Temp 99.7 F (37.6 C) (Oral)   Resp 16   Ht 5\' 3"  (1.6 m)   Wt 230 lb (104.3 kg)   LMP 03/18/2013 (Approximate)   SpO2 100%   BMI 40.74 kg/m   Physical Exam  Constitutional: She is oriented to person, place, and time. She appears well-developed and well-nourished. No distress.  Appears uncomfortable.  HENT:  Head: Normocephalic and atraumatic.  Mouth/Throat: Oropharynx is clear and moist. No oropharyngeal exudate.  Eyes: Conjunctivae and EOM are normal. Pupils are equal, round, and reactive to light.  Neck: Normal range of motion. Neck supple.  No meningismus.  Cardiovascular: Regular rhythm, normal heart sounds and intact distal pulses.  Tachycardia present.   No murmur heard. Tachycardic to the 110s.  Pulmonary/Chest: Effort normal and breath sounds normal. No respiratory distress. She exhibits no tenderness.  Abdominal: Soft. There is no tenderness. There is no rebound and no guarding.  Musculoskeletal: Normal range of motion. She exhibits no edema or tenderness.  No leg swelling.  Neurological: She is alert and oriented to person, place, and time. No cranial nerve deficit. She exhibits normal muscle tone. Coordination normal.   5/5 strength throughout. CN 2-12 intact.Equal grip strength.   Skin: Skin is  warm.  Psychiatric: She has a normal mood and affect. Her behavior is normal.  Nursing note and vitals reviewed.    ED Treatments / Results  DIAGNOSTIC STUDIES: Oxygen Saturation is 100% on RA, normal by my interpretation.    COORDINATION OF CARE: 11:39 PM Discussed treatment plan with pt at bedside and pt agreed to plan.  Labs (all labs ordered are listed, but only abnormal results are displayed) Labs Reviewed  BASIC METABOLIC PANEL - Abnormal; Notable for the following:       Result Value   Glucose, Bld 122 (*)    All other components within normal limits  CBC - Abnormal; Notable for the following:    WBC 13.8 (*)    RBC 3.72 (*)    Hemoglobin 10.9 (*)    HCT 33.7 (*)    Platelets 564 (*)  All other components within normal limits  SEDIMENTATION RATE - Abnormal; Notable for the following:    Sed Rate 90 (*)    All other components within normal limits  TROPONIN I  LACTIC ACID, PLASMA  C-REACTIVE PROTEIN  D-DIMER, QUANTITATIVE (NOT AT [facility])    EKG  EKG Interpretation  Date/Time:  Wednesday May 13 2016 22:26:25 EST Ventricular Rate:  110 PR Interval:    QRS Duration: 83 QT Interval:  299 QTC Calculation: 405 R Axis:   77 Text Interpretation:  Sinus tachycardia Ventricular bigeminy Aberrant complex RSR' in V1 or V2, probably normal variant Nonspecific repol abnormality, diffuse leads Confirmed by ZAMMIT  MD, JOSEPH [phone]) on 05/13/2016 10:44:09 PM       Radiology Dg Chest 2 View  Result Date: 05/13/2016 CLINICAL DATA:  Recent hospitalization for chest pain, similar symptoms began tonight. History of COPD, hypertension. EXAM: CHEST  2 VIEW COMPARISON:  Chest radiograph May 07, 2016 FINDINGS: Cardiac silhouette is mildly enlarged and unchanged. Mediastinal silhouette is nonsuspicious. New small LEFT greater than RIGHT pleural effusions. Pulmonary vasculature appears normal. No pneumothorax. LEFT humeral head bone infarct versus enchondroma. Linear  lucency projecting RIGHT humeral head may be extraosseous, recommend correlation with point tenderness. IMPRESSION: New small bilateral pleural effusions. Mild cardiomegaly. Electronically Signed   By: Elon Alas M.D.   On: 05/13/2016 23:18   Dg Shoulder Right  Result Date: 05/14/2016 CLINICAL DATA:  Intermittent chest pain radiating to bilateral shoulders. EXAM: RIGHT SHOULDER - 2+ VIEW COMPARISON:  RIGHT shoulder radiograph March 20, 2010 and chest radiograph May 13, 2016 FINDINGS: The humeral head is well-formed and located. The subacromial, glenohumeral and acromioclavicular joint spaces are intact. Mild acromioclavicular osteoarthrosis. No destructive bony lesions. Soft tissue planes are non-suspicious. IMPRESSION: No acute fracture deformity or dislocation. Electronically Signed   By: Elon Alas M.D.   On: 05/14/2016 00:41   Ct Angio Chest Pe W And/or Wo Contrast  Result Date: 05/14/2016 CLINICAL DATA:  50 y/o F; pericarditis, fever, chest pain, and shortness of breath. EXAM: CT ANGIOGRAPHY CHEST WITH CONTRAST TECHNIQUE: Multidetector CT imaging of the chest was performed using the standard protocol during bolus administration of intravenous contrast. Multiplanar CT image reconstructions and MIPs were obtained to evaluate the vascular anatomy. CONTRAST:  100 cc Isovue 370 COMPARISON:  05/07/2016 CT of the abdomen and pelvis. 04/05/2004 chest radiograph. FINDINGS: Cardiovascular: Satisfactory opacification of the pulmonary arteries to the segmental level. No evidence of pulmonary embolism. Normal heart size. Pericardial effusion is mildly decreased in comparison with prior CT of the abdomen and pelvis. Normal caliber thoracic aorta. Mediastinum/Nodes: Mediastinal lymphadenopathy is likely reactive. Normal thyroid gland. Normal thoracic esophagus. Lungs/Pleura: Increased small left pleural effusion and associated minor left basilar atelectasis. Otherwise clear lungs. Upper Abdomen:  Partially visualized hepatosplenomegaly is stable. Hepatic steatosis. Musculoskeletal: Stable sclerotic focus within the left proximal humerus from a Rees 2005, probably a chondroid rest. No acute osseous abnormality. Review of the MIP images confirms the above findings. IMPRESSION: 1. Minor respiratory artifact in the lung bases. No pulmonary embolus identified. 2. Small pericardial effusion decreased in size. Prominent mediastinal lymph nodes are likely reactive. 3. Small left pleural effusion and left minor basilar atelectasis are increased in size from prior abdomen CT. 4. Hepatosplenomegaly with hepatic steatosis. Electronically Signed   By: Kristine Garbe M.D.   On: 05/14/2016 00:41    Procedures Procedures (including critical care time)  Medications Ordered in ED Medications - No data to display   Initial Impression /  Assessment and Plan / ED Course  I have reviewed the triage vital signs and the nursing notes.  Pertinent labs & imaging results that were available during my care of the patient were reviewed by me and considered in my medical decision making (see chart for details).  Clinical Course    Patient with central chest pain radiates into both shoulders and upper back onset last night. It is worse with deep breathing and lying flat. It is better with leaning forward. She was recently hospitalized for pericarditis and is taking colchicine and naproxen.  Patient has new T-wave inversions laterally on EKG. She is febrile to 102.4.  Chest x-ray shows new pleural effusions. CT shows no pulmonary embolism but does show smaller pericardial effusion and increased pleural effusion.  Discussed with cardiology Dr. Eula Fried. He agrees patient should be admitted for symptom control and serial troponins. Does not feel she needs to be transferred to [facility].  Pain improved with treatment in the ED. Blood cultures sent.  Lactate normal.  Blood cultures from 12/8 are  negative.  Observation admission d/w Dr. Darrick Meigs.  Final Clinical Impressions(s) / ED Diagnoses   Final diagnoses:  Acute pericarditis, unspecified type  Fever, unspecified fever cause    New Prescriptions New Prescriptions   No medications on file   I personally performed the services described in this documentation, which was scribed in my presence. The recorded information has been reviewed and is accurate.     Ezequiel Essex, MD 05/14/16 [phone]

## 2016-05-14 ENCOUNTER — Emergency Department (HOSPITAL_COMMUNITY): Payer: Federal, State, Local not specified - PPO

## 2016-05-14 ENCOUNTER — Observation Stay (HOSPITAL_COMMUNITY): Payer: Federal, State, Local not specified - PPO

## 2016-05-14 ENCOUNTER — Encounter (HOSPITAL_COMMUNITY): Payer: Self-pay

## 2016-05-14 DIAGNOSIS — I319 Disease of pericardium, unspecified: Secondary | ICD-10-CM

## 2016-05-14 DIAGNOSIS — R0602 Shortness of breath: Secondary | ICD-10-CM | POA: Diagnosis not present

## 2016-05-14 DIAGNOSIS — J438 Other emphysema: Secondary | ICD-10-CM

## 2016-05-14 DIAGNOSIS — K7689 Other specified diseases of liver: Secondary | ICD-10-CM | POA: Diagnosis not present

## 2016-05-14 DIAGNOSIS — I313 Pericardial effusion (noninflammatory): Secondary | ICD-10-CM

## 2016-05-14 DIAGNOSIS — M25511 Pain in right shoulder: Secondary | ICD-10-CM | POA: Diagnosis not present

## 2016-05-14 DIAGNOSIS — R5081 Fever presenting with conditions classified elsewhere: Secondary | ICD-10-CM

## 2016-05-14 LAB — COMPREHENSIVE METABOLIC PANEL
ALBUMIN: 3.1 g/dL — AB (ref 3.5–5.0)
ALT: 34 U/L (ref 14–54)
AST: 11 U/L — AB (ref 15–41)
Alkaline Phosphatase: 107 U/L (ref 38–126)
Anion gap: 8 (ref 5–15)
BUN: 9 mg/dL (ref 6–20)
CHLORIDE: 104 mmol/L (ref 101–111)
CO2: 25 mmol/L (ref 22–32)
Calcium: 9 mg/dL (ref 8.9–10.3)
Creatinine, Ser: 0.58 mg/dL (ref 0.44–1.00)
GFR calc Af Amer: >60 mL/min (ref >60)
GFR calc non Af Amer: >60 mL/min (ref >60)
GLUCOSE: 148 mg/dL — AB (ref 65–99)
POTASSIUM: 3.9 mmol/L (ref 3.5–5.1)
Sodium: 137 mmol/L (ref 135–145)
Total Bilirubin: 0.3 mg/dL (ref 0.3–1.2)
Total Protein: 6.9 g/dL (ref 6.5–8.1)

## 2016-05-14 LAB — CBC
HEMATOCRIT: 32.3 % — AB (ref 36.0–46.0)
Hemoglobin: 10.5 g/dL — ABNORMAL LOW (ref 12.0–15.0)
MCH: 29.5 pg (ref 26.0–34.0)
MCHC: 32.5 g/dL (ref 30.0–36.0)
MCV: 90.7 fL (ref 78.0–100.0)
PLATELETS: 509 10*3/uL — AB (ref 150–400)
RBC: 3.56 MIL/uL — AB (ref 3.87–5.11)
RDW: 13.4 % (ref 11.5–15.5)
WBC: 14.4 10*3/uL — AB (ref 4.0–10.5)

## 2016-05-14 LAB — TROPONIN I
Troponin I: <0.03 ng/mL (ref <0.03)
Troponin I: <0.03 ng/mL (ref <0.03)
Troponin I: <0.03 ng/mL (ref <0.03)

## 2016-05-14 LAB — C-REACTIVE PROTEIN: CRP: 20.1 mg/dL — AB (ref <1.0)

## 2016-05-14 LAB — SEDIMENTATION RATE: SED RATE: 90 mm/h — AB (ref 0–22)

## 2016-05-14 LAB — D-DIMER, QUANTITATIVE (NOT AT ARMC): D DIMER QUANT: 6.44 ug{FEU}/mL — AB (ref 0.00–0.50)

## 2016-05-14 MED ORDER — IBUPROFEN 600 MG PO TABS
600.0000 mg | ORAL_TABLET | Freq: Three times a day (TID) | ORAL | Status: DC
  Administered 2016-05-14 (×3): 600 mg via ORAL
  Filled 2016-05-14 (×3): qty 1

## 2016-05-14 MED ORDER — HYDROCODONE-ACETAMINOPHEN 5-325 MG PO TABS: 1.0000 | ORAL_TABLET | Freq: Four times a day (QID) | ORAL | 0 refills | Status: DC | PRN

## 2016-05-14 MED ORDER — ENOXAPARIN SODIUM 40 MG/0.4ML ~~LOC~~ SOLN
40.0000 mg | SUBCUTANEOUS | Status: DC
  Administered 2016-05-14: 40 mg via SUBCUTANEOUS
  Filled 2016-05-14: qty 0.4

## 2016-05-14 MED ORDER — PREDNISONE 20 MG PO TABS: 40.0000 mg | ORAL_TABLET | Freq: Every day | ORAL | 0 refills | Status: DC

## 2016-05-14 MED ORDER — COLCHICINE 0.6 MG PO TABS
0.6000 mg | ORAL_TABLET | Freq: Two times a day (BID) | ORAL | Status: DC
  Administered 2016-05-14 (×2): 0.6 mg via ORAL
  Filled 2016-05-14 (×2): qty 1

## 2016-05-14 MED ORDER — ALBUTEROL SULFATE (2.5 MG/3ML) 0.083% IN NEBU: 3.0000 mL | INHALATION_SOLUTION | Freq: Four times a day (QID) | RESPIRATORY_TRACT | Status: DC | PRN

## 2016-05-14 MED ORDER — CLONAZEPAM 0.5 MG PO TABS: 0.5000 mg | ORAL_TABLET | Freq: Every day | ORAL | Status: DC

## 2016-05-14 MED ORDER — METHYLPREDNISOLONE SODIUM SUCC 125 MG IJ SOLR
60.0000 mg | INTRAMUSCULAR | Status: AC
  Administered 2016-05-14: 60 mg via INTRAVENOUS
  Filled 2016-05-14: qty 2

## 2016-05-14 MED ORDER — SODIUM CHLORIDE 0.9 % IV BOLUS (SEPSIS)
500.0000 mL | Freq: Once | INTRAVENOUS | Status: AC
  Administered 2016-05-14: 500 mL via INTRAVENOUS

## 2016-05-14 MED ORDER — SODIUM CHLORIDE 0.9% FLUSH: 3.0000 mL | INTRAVENOUS | Status: DC | PRN

## 2016-05-14 MED ORDER — FUROSEMIDE 20 MG PO TABS
20.0000 mg | ORAL_TABLET | Freq: Every day | ORAL | Status: DC
  Administered 2016-05-14: 20 mg via ORAL
  Filled 2016-05-14: qty 1

## 2016-05-14 MED ORDER — MORPHINE SULFATE (PF) 2 MG/ML IV SOLN
2.0000 mg | INTRAVENOUS | Status: DC | PRN
  Administered 2016-05-14 (×3): 2 mg via INTRAVENOUS
  Filled 2016-05-14 (×3): qty 1

## 2016-05-14 MED ORDER — LORATADINE 10 MG PO TABS
10.0000 mg | ORAL_TABLET | ORAL | Status: DC
Start: 2016-05-14 — End: 2016-05-14
  Administered 2016-05-14: 10 mg via ORAL
  Filled 2016-05-14: qty 1

## 2016-05-14 MED ORDER — SODIUM CHLORIDE 0.9 % IV SOLN: 250.0000 mL | INTRAVENOUS | Status: DC | PRN

## 2016-05-14 MED ORDER — SODIUM CHLORIDE 0.9% FLUSH
3.0000 mL | Freq: Two times a day (BID) | INTRAVENOUS | Status: DC
  Administered 2016-05-14 (×2): 3 mL via INTRAVENOUS

## 2016-05-14 NOTE — Progress Notes (Signed)
Discussed with Dr. Roderic Palau. I recommend a short (5 day) course of low dose steroids to help attenuate inflammation, with continuation of colchicine and ibuprofen. She will follow up with cardiology as scheduled.

## 2016-05-14 NOTE — Discharge Summary (Signed)
Physician Discharge Summary  [name] [id number] DOB: [birth year] DOA: 05/13/2016  PCP: Sallee Lange, MD  Admit date: 05/13/2016 Discharge date: 05/14/2016  Admitted From: home Disposition:  home  Recommendations for Outpatient Follow-up:  1. Follow up with PCP in 1-2 weeks 2. Follow up with cardiology on 12/18 as previously scheduled  Home Health: Equipment/Devices:  Discharge Condition: stable CODE STATUS: full Diet recommendation: Heart Healthy   Brief/Interim Summary: Patient presents to the hospital with complaints of central chest pain that radiated to both shoulders and upper back. It was worse with deep breathing and lying flat. Better with leaning forward. She was recently in the hospital and discharged on 12/10 from [facility] after being treated for acute pericarditis. She was discharged on ibuprofen and colchicine. She reports taking these medications and initially had felt well. Her symptoms did recur and she felt the pain was not tolerable which prompted her to come back to the emergency room. She was admitted to the hospital for cardiac enzymes were cycled. She ruled out for ACS with negative cardiac enzymes. She does report some improvement of pain with morphine. She was also noted to be febrile in the emergency room. D-dimer was noted to be elevated, but CT scan of chest was negative for pulmonary embolus. No significant findings were noted other than reactive lymphadenopathy. Case was discussed with Dr. Bronson Ing who felt the patient may benefit from a course of steroids. She'll be prescribed a five-day course of prednisone and is advised to continue on ibuprofen and colchicine. At this time, she is afebrile. No obvious signs of infection. It is likely her fever was related to an inflammatory process. The patient already has cardiology follow-up scheduled for 12/18. She was advised to keep this appointment. Patient will be discharged home in stable  condition.  Discharge Diagnoses:  Active Problems:   COPD (chronic obstructive pulmonary disease) with emphysema (HCC)   Pericardial effusion   Fever   Pericarditis    Discharge Instructions  Discharge Instructions    Diet - low sodium heart healthy    Complete by:  As directed    Increase activity slowly    Complete by:  As directed      Allergies as of 05/14/2016      Reactions   Gabapentin Hives   Augmentin [amoxicillin-pot Clavulanate] Nausea And Vomiting      Medication List    TAKE these medications   albuterol 108 (90 Base) MCG/ACT inhaler Commonly known as:  PROAIR HFA Inhale 2 puffs into the lungs every 6 (six) hours as needed for wheezing or shortness of breath.   clonazePAM 0.5 MG tablet Commonly known as:  KLONOPIN Take 1 tablet by mouth at bedtime.   colchicine 0.6 MG tablet Take 1 tablet (0.6 mg total) by mouth 2 (two) times daily.   HYDROcodone-acetaminophen 5-325 MG tablet Commonly known as:  NORCO Take 1 tablet by mouth every 6 (six) hours as needed for moderate pain.   ibuprofen 600 MG tablet Commonly known as:  ADVIL,MOTRIN Take 1 tablet (600 mg total) by mouth 3 (three) times daily with meals.   loratadine 10 MG tablet Commonly known as:  CLARITIN Take 10 mg by mouth every morning.   omeprazole 20 MG capsule Commonly known as:  PRILOSEC Take 1 capsule (20 mg total) by mouth daily.   predniSONE 20 MG tablet Commonly known as:  DELTASONE Take 2 tablets (40 mg total) by mouth daily with breakfast.   tiotropium 18 MCG inhalation  capsule Commonly known as:  SPIRIVA HANDIHALER INHALE THE CONTENTS OF 1 CAPSULE VIA HANDIHALER EVERY DAY       Allergies  Allergen Reactions  . Gabapentin Hives  . Augmentin [Amoxicillin-Pot Clavulanate] Nausea And Vomiting    Consultations:     Procedures/Studies: Dg Chest 2 View  Result Date: 05/13/2016 CLINICAL DATA:  Recent hospitalization for chest pain, similar symptoms began tonight.  History of COPD, hypertension. EXAM: CHEST  2 VIEW COMPARISON:  Chest radiograph May 07, 2016 FINDINGS: Cardiac silhouette is mildly enlarged and unchanged. Mediastinal silhouette is nonsuspicious. New small LEFT greater than RIGHT pleural effusions. Pulmonary vasculature appears normal. No pneumothorax. LEFT humeral head bone infarct versus enchondroma. Linear lucency projecting RIGHT humeral head may be extraosseous, recommend correlation with point tenderness. IMPRESSION: New small bilateral pleural effusions. Mild cardiomegaly. Electronically Signed   By: Elon Alas M.D.   On: 05/13/2016 23:18   Dg Chest 2 View  Result Date: 05/07/2016 CLINICAL DATA:  50 y/o F; upper abdominal pain in central chest pain radiating to the left shoulder. 3 weeks of cough. EXAM: CHEST  2 VIEW COMPARISON:  04/29/2016 chest radiograph. 04/05/2004 chest radiograph. FINDINGS: Mild enlargement of the cardiac silhouette, possibly accentuated by technique. Increased interstitial markings may represent pulmonary vascular congestion. No focal consolidation. No pleural effusion. Bones are unremarkable. Left proximal humerus sclerotic focus seen in 2005, probably a cartilaginous rest. IMPRESSION: Mild enlargement of cardiac silhouette, possibly accentuated by technique. Increased interstitial markings, probably pulmonary vascular congestion. Electronically Signed   By: Kristine Garbe M.D.   On: 05/07/2016 19:55   Dg Chest 2 View  Result Date: 04/29/2016 CLINICAL DATA:  Chest pain for 3 weeks EXAM: CHEST  2 VIEW COMPARISON:  03/18/2016 FINDINGS: The heart size and mediastinal contours are within normal limits. Both lungs are clear. The visualized skeletal structures are unremarkable. IMPRESSION: No active cardiopulmonary disease. Electronically Signed   By: Inez Catalina M.D.   On: 04/29/2016 18:01   Dg Shoulder Right  Result Date: 05/14/2016 CLINICAL DATA:  Intermittent chest pain radiating to bilateral  shoulders. EXAM: RIGHT SHOULDER - 2+ VIEW COMPARISON:  RIGHT shoulder radiograph March 20, 2010 and chest radiograph May 13, 2016 FINDINGS: The humeral head is well-formed and located. The subacromial, glenohumeral and acromioclavicular joint spaces are intact. Mild acromioclavicular osteoarthrosis. No destructive bony lesions. Soft tissue planes are non-suspicious. IMPRESSION: No acute fracture deformity or dislocation. Electronically Signed   By: Elon Alas M.D.   On: 05/14/2016 00:41   Ct Angio Chest Pe W And/or Wo Contrast  Result Date: 05/14/2016 CLINICAL DATA:  50 y/o F; pericarditis, fever, chest pain, and shortness of breath. EXAM: CT ANGIOGRAPHY CHEST WITH CONTRAST TECHNIQUE: Multidetector CT imaging of the chest was performed using the standard protocol during bolus administration of intravenous contrast. Multiplanar CT image reconstructions and MIPs were obtained to evaluate the vascular anatomy. CONTRAST:  100 cc Isovue 370 COMPARISON:  05/07/2016 CT of the abdomen and pelvis. 04/05/2004 chest radiograph. FINDINGS: Cardiovascular: Satisfactory opacification of the pulmonary arteries to the segmental level. No evidence of pulmonary embolism. Normal heart size. Pericardial effusion is mildly decreased in comparison with prior CT of the abdomen and pelvis. Normal caliber thoracic aorta. Mediastinum/Nodes: Mediastinal lymphadenopathy is likely reactive. Normal thyroid gland. Normal thoracic esophagus. Lungs/Pleura: Increased small left pleural effusion and associated minor left basilar atelectasis. Otherwise clear lungs. Upper Abdomen: Partially visualized hepatosplenomegaly is stable. Hepatic steatosis. Musculoskeletal: Stable sclerotic focus within the left proximal humerus from a Rees 2005, probably a  chondroid rest. No acute osseous abnormality. Review of the MIP images confirms the above findings. IMPRESSION: 1. Minor respiratory artifact in the lung bases. No pulmonary embolus  identified. 2. Small pericardial effusion decreased in size. Prominent mediastinal lymph nodes are likely reactive. 3. Small left pleural effusion and left minor basilar atelectasis are increased in size from prior abdomen CT. 4. Hepatosplenomegaly with hepatic steatosis. Electronically Signed   By: Kristine Garbe M.D.   On: 05/14/2016 00:41   US Abdomen Complete  Result Date: 05/14/2016 CLINICAL DATA:  Liver and splenic prominence EXAM: ABDOMEN ULTRASOUND COMPLETE COMPARISON:  CT abdomen and pelvis May 07, 2016 FINDINGS: Gallbladder: No gallstones or wall thickening visualized. There is no pericholecystic fluid. No sonographic Murphy sign noted by sonographer. Common bile duct: Diameter: 2 mm. There is no intrahepatic, common hepatic, common bile duct dilatation. Liver: No focal lesion identified. Within normal limits in parenchymal echogenicity. Liver is prominent, measuring approximately 18 cm in length. IVC: No abnormality visualized. Pancreas: No mass or inflammatory focus. Spleen: Spleen measures 12.4 x 10.1 x 5.1 cm with a measured splenic volume of 335 cubic cm. No focal splenic lesions are evident. Right Kidney: Length: 12.5 cm. Echogenicity within normal limits. No mass or hydronephrosis visualized. Left Kidney: Length: 11.9 cm. Echogenicity within normal limits. No mass or hydronephrosis visualized. Abdominal aorta: No aneurysm visualized. Other findings: No demonstrable ascites. There are small pleural effusions bilaterally. IMPRESSION: Prominent liver. Spleen is toward the upper limits of normal in size. No focal liver or splenic lesions are evident by ultrasound. Small pleural effusions bilaterally. Study otherwise unremarkable. Electronically Signed   By: Lowella Grip III M.D.   On: 05/14/2016 08:25   Ct Abdomen Pelvis W Contrast  Result Date: 05/07/2016 CLINICAL DATA:  Upper abdominal pain, onset this morning. EXAM: CT ABDOMEN AND PELVIS WITH CONTRAST TECHNIQUE:  Multidetector CT imaging of the abdomen and pelvis was performed using the standard protocol following bolus administration of intravenous contrast. CONTRAST:  192mL ISOVUE-300 IOPAMIDOL (ISOVUE-300) COMPARISON:  CT abdomen/ pelvis 02/12/2015, chest radiographs earlier this day. FINDINGS: Lower chest: Moderate circumferential pericardial effusion measures up to 15 mm in depth. Trace left pleural effusion. Lingular atelectasis. Hepatobiliary: Liver is prominent in size spanning 19.6 cm cranial caudal with borderline mild hepatic steatosis. No focal hepatic lesion. There is mild periportal edema. Gallbladder is distended, no calcified stone. No biliary dilatation. Pancreas: No ductal dilatation or inflammation. Spleen: Mildly enlarged measuring 15.2 x 5.2 x 11.3 cm (volume = 470 cm^3). No focal splenic abnormality. Adrenals/Urinary Tract: No adrenal nodule. Symmetric renal enhancement and excretion. No hydronephrosis. Trace perinephric edema about the lower right kidney. Ureters are decompressed. Urinary bladder is minimally distended. Stomach/Bowel: Stomach is physiologically distended. No small bowel dilatation or inflammation. The appendix is normal. There is an incidental 11 mm fat density lesion in the mid transverse colon, consistent with mural lipoma. No obstruction. This is unchanged from prior exam. Vascular/Lymphatic: Mild atherosclerosis without aneurysm. The scattered small retroperitoneal lymph nodes. Reproductive: Uterus is bulbous in appearance, possible underlying fibroids. No adnexal mass. Ovaries are quiescent. Other: No ascites or free air. Musculoskeletal: There are no acute or suspicious osseous abnormalities. Degenerative change in the spine with mild degenerative disc disease and facet arthropathy. Hemi transitional lumbosacral anatomy. IMPRESSION: 1. Moderate circumferential pericardial effusion measuring up to 15 mm, new. 2. Mild hepatosplenomegaly and hepatic steatosis. Periportal edema is  nonspecific, can be seen with hydration status versus right heart failure, however there is no right heart dilatation on  CT. 3. Aortic atherosclerosis. 4. 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 Electronically Signed   By: Jeb Levering M.D.   On: 05/07/2016 20:15       Subjective: Has some pain in chest and shoulders, improving with pain meds  Discharge Exam: Vitals:   05/14/16 0318 05/14/16 1427  BP: 134/68 131/60  Pulse: 95 91  Resp: 18 18  Temp: 98.8 F (37.1 C) 99.1 F (37.3 C)   Vitals:   05/14/16 0100 05/14/16 0200 05/14/16 0318 05/14/16 1427  BP: 143/70 132/59 134/68 131/60  Pulse: 98 92 95 91  Resp: 23 18 18 18   Temp:   98.8 F (37.1 C) 99.1 F (37.3 C)  TempSrc:   Oral Oral  SpO2: 99% 97% 100% 99%  Weight:   102.3 kg (225 lb 9.6 oz)   Height:   5\' 3"  (1.6 m)     General: Pt is alert, awake, not in acute distress Cardiovascular: RRR, S1/S2 +, no rubs, no gallops Respiratory: CTA bilaterally, no wheezing, no rhonchi Abdominal: Soft, NT, ND, bowel sounds + Extremities: no edema, no cyanosis    The results of significant diagnostics from this hospitalization (including imaging, microbiology, ancillary and laboratory) are listed below for reference.     Microbiology: Recent Results (from the past 240 hour(s))  Culture, blood (routine x 2)     Status: None   Collection Time: 05/08/16  3:50 AM  Result Value Ref Range Status   Specimen Description BLOOD RIGHT HAND  Final   Special Requests BOTTLES DRAWN AEROBIC AND ANAEROBIC 10ML  Final   Culture NO GROWTH 5 DAYS  Final   Report Status 05/13/2016 FINAL  Final  Culture, blood (routine x 2)     Status: None   Collection Time: 05/08/16  3:52 AM  Result Value Ref Range Status   Specimen Description BLOOD LEFT ARM  Final   Special Requests BOTTLES DRAWN AEROBIC AND ANAEROBIC 10ML  Final   Culture NO GROWTH 5 DAYS  Final   Report Status 05/13/2016 FINAL  Final  Urine  culture     Status: Abnormal   Collection Time: 05/08/16 12:15 PM  Result Value Ref Range Status   Specimen Description URINE, RANDOM  Final   Special Requests Normal  Final   Culture MULTIPLE SPECIES PRESENT, SUGGEST RECOLLECTION (A)  Final   Report Status 05/09/2016 FINAL  Final     Labs: BNP (last 3 results)  Recent Labs  05/07/16 1736  BNP [id number]   Basic Metabolic Panel:  Recent Labs Lab 05/08/16 0945 05/13/16 2239 05/14/16 0326  NA 137 137 137  K 4.2 4.1 3.9  CL 104 103 104  CO2 24 25 25   GLUCOSE 103* 122* 148*  BUN 7 12 9   CREATININE 0.62 0.55 0.58  CALCIUM 8.2* 9.1 9.0   Liver Function Tests:  Recent Labs Lab 05/14/16 0326  AST 11*  ALT 34  ALKPHOS 107  BILITOT 0.3  PROT 6.9  ALBUMIN 3.1*   No results for input(s): LIPASE, AMYLASE in the last 168 hours. No results for input(s): AMMONIA in the last 168 hours. CBC:  Recent Labs Lab 05/08/16 0945 05/13/16 2239 05/14/16 0326  WBC 9.2 13.8* 14.4*  HGB 9.0* 10.9* 10.5*  HCT 28.3* 33.7* 32.3*  MCV 91.6 90.6 90.7  PLT 316 564* 509*   Cardiac Enzymes:  Recent Labs Lab 05/13/16 2239 05/14/16 0326 05/14/16 0942 05/14/16 1522  TROPONINI <0.03 <0.03 <0.03 <0.03   BNP:  Invalid input(s): POCBNP CBG: No results for input(s): GLUCAP in the last 168 hours. D-Dimer  Recent Labs  05/13/16 2239  DDIMER 6.44*   Hgb A1c No results for input(s): HGBA1C in the last 72 hours. Lipid Profile No results for input(s): CHOL, HDL, LDLCALC, TRIG, CHOLHDL, LDLDIRECT in the last 72 hours. Thyroid function studies No results for input(s): TSH, T4TOTAL, T3FREE, THYROIDAB in the last 72 hours.  Invalid input(s): FREET3 Anemia work up No results for input(s): VITAMINB12, FOLATE, FERRITIN, TIBC, IRON, RETICCTPCT in the last 72 hours. Urinalysis    Component Value Date/Time   COLORURINE YELLOW 05/07/2016 1738   APPEARANCEUR CLEAR 05/07/2016 1738   LABSPEC 1.009 05/07/2016 1738   PHURINE 6.0 05/07/2016 1738    GLUCOSEU NEGATIVE 05/07/2016 1738   HGBUR NEGATIVE 05/07/2016 1738   BILIRUBINUR NEGATIVE 05/07/2016 1738   KETONESUR NEGATIVE 05/07/2016 1738   PROTEINUR NEGATIVE 05/07/2016 1738   UROBILINOGEN 0.2 02/12/2015 1542   NITRITE NEGATIVE 05/07/2016 1738   LEUKOCYTESUR NEGATIVE 05/07/2016 1738   Sepsis Labs Invalid input(s): PROCALCITONIN,  WBC,  LACTICIDVEN Microbiology Recent Results (from the past 240 hour(s))  Culture, blood (routine x 2)     Status: None   Collection Time: 05/08/16  3:50 AM  Result Value Ref Range Status   Specimen Description BLOOD RIGHT HAND  Final   Special Requests BOTTLES DRAWN AEROBIC AND ANAEROBIC 10ML  Final   Culture NO GROWTH 5 DAYS  Final   Report Status 05/13/2016 FINAL  Final  Culture, blood (routine x 2)     Status: None   Collection Time: 05/08/16  3:52 AM  Result Value Ref Range Status   Specimen Description BLOOD LEFT ARM  Final   Special Requests BOTTLES DRAWN AEROBIC AND ANAEROBIC 10ML  Final   Culture NO GROWTH 5 DAYS  Final   Report Status 05/13/2016 FINAL  Final  Urine culture     Status: Abnormal   Collection Time: 05/08/16 12:15 PM  Result Value Ref Range Status   Specimen Description URINE, RANDOM  Final   Special Requests Normal  Final   Culture MULTIPLE SPECIES PRESENT, SUGGEST RECOLLECTION (A)  Final   Report Status 05/09/2016 FINAL  Final     Time coordinating discharge: Over 30 minutes  SIGNED:   Kathie Dike, MD  [facility] 05/14/2016, 5:52 PM Pager   If 7PM-7AM, please contact night-coverage [email or web address] Password [email or web address]

## 2016-05-14 NOTE — H&P (Addendum)
TRH H&P    Patient Demographics:    [name], is a 50 y.o. female  MRN: [id number]  DOB - 1965-10-04  Admit Date - 05/13/2016  Referring MD/NP/PA: Dr Wyvonnia Dusky  Outpatient Primary MD for the patient is Sallee Lange, MD  Patient coming from: home   Chief Complaint  Patient presents with  . Chest Pain      HPI:    [name]  is a 50 y.o. female, With history of COPD, pericarditis, GERD, hypertension who came to the ED with complaints of chest pain radiating to back associated with shortness of breath and some cough. Patient was recently discharged from [facility] after she was treated for acute pericarditis. Patient was discharged on colchicine and ibuprofen and was told that she should expect some intermittent pain. The patient says that the pain was severe last night so she came to ED for further evaluation. CT chest angiogram was done which was negative for PE. Cardiology fellow was consulted at [facility] who recommended observation overnight, pain likely from pericarditis. Patient denies nausea, vomiting. Patient also found to have fever with temp of 102.4 in the ED.    Review of systems:    In addition to the HPI above,  + Fever no -chills, No Headache, No changes with Vision or hearing, No problems swallowing food or Liquids, No Abdominal pain, No Nausea or Vomiting, bowel movements are regular, No Blood in stool or Urine, No dysuria, No new skin rashes or bruises, No new joints pains-aches,  No new weakness, tingling, numbness in any extremity, No recent weight gain or loss, No polyuria, polydypsia or polyphagia, No significant Mental Stressors.  A full 10 point Review of Systems was done, except as stated above, all other Review of Systems were negative.   With Past History of the following :    Past Medical History:  Diagnosis Date  . Acute pericarditis  05/10/2016  . ADD (attention deficit disorder)   . Bipolar 1 disorder ([place])   . COPD (chronic obstructive pulmonary disease) ([place])   . Depression   . GERD (gastroesophageal reflux disease)   . Hypertension   . IFG (impaired fasting glucose)   . Substance abuse       Past Surgical History:  Procedure Laterality Date  . ORTHOPEDIC SURGERY    . TUBAL LIGATION        Social History:      Social History  Substance Use Topics  . Smoking status: Former Smoker    Packs/day: 1.00    Types: Cigarettes    Quit date: 04/01/2016  . Smokeless tobacco: Never Used  . Alcohol use No       Family History :     Family History  Problem Relation Age of Onset  . Atrial fibrillation Mother       Home Medications:   Prior to Admission medications   Medication Sig Start Date End Date Taking? Authorizing Provider  albuterol (PROAIR HFA) 108 (90 Base) MCG/ACT inhaler Inhale 2 puffs into the lungs every 6 (  six) hours as needed for wheezing or shortness of breath. 04/15/16   Mikey Kirschner, MD  clonazePAM (KLONOPIN) 0.5 MG tablet Take 1 tablet by mouth at bedtime. 03/16/16   Historical Provider, MD  colchicine 0.6 MG tablet Take 1 tablet (0.6 mg total) by mouth 2 (two) times daily. 05/10/16   Rhonda G Barrett, PA-C  ibuprofen (ADVIL,MOTRIN) 600 MG tablet Take 1 tablet (600 mg total) by mouth 3 (three) times daily with meals. 05/10/16   Rhonda G Barrett, PA-C  loratadine (CLARITIN) 10 MG tablet Take 10 mg by mouth every morning.    Historical Provider, MD  omeprazole (PRILOSEC) 20 MG capsule Take 1 capsule (20 mg total) by mouth daily. 05/10/16   Rhonda G Barrett, PA-C  polyvinyl alcohol (LIQUIFILM TEARS) 1.4 % ophthalmic solution Place 2 drops into both eyes as needed for dry eyes.    Historical Provider, MD  tiotropium (SPIRIVA HANDIHALER) 18 MCG inhalation capsule INHALE THE CONTENTS OF 1 CAPSULE VIA HANDIHALER EVERY DAY 12/20/15   Kathyrn Drown, MD     Allergies:     Allergies    Allergen Reactions  . Gabapentin Hives  . Augmentin [Amoxicillin-Pot Clavulanate] Nausea And Vomiting     Physical Exam:   Vitals  Blood pressure 143/70, pulse 98, temperature 102.4 F (39.1 C), temperature source Rectal, resp. rate 23, height 5\' 3"  (1.6 m), weight 104.3 kg (230 lb), last menstrual period 03/18/2013, SpO2 99 %.  1.  General: Appears in no acute distress  2. Psychiatric:  Intact judgement and  insight, awake alert, oriented x 3.  3. Neurologic: No focal neurological deficits, all cranial nerves intact.Strength 5/5 all 4 extremities, sensation intact all 4 extremities, plantars down going.  4. Eyes :  anicteric sclerae, moist conjunctivae with no lid lag. PERRLA.  5. ENMT:  Oropharynx clear with moist mucous membranes and good dentition  6. Neck:  supple, no cervical lymphadenopathy appriciated, No thyromegaly  7. Respiratory : Normal respiratory effort, good air movement bilaterally,clear to  auscultation bilaterally  8. Cardiovascular : RRR, no gallops, rubs or murmurs, no leg edema  9. Gastrointestinal:  Positive bowel sounds, abdomen soft, non-tender to palpation,no hepatosplenomegaly, no rigidity or guarding       10. Skin:  No cyanosis, normal texture and turgor, no rash, lesions or ulcers  11.Musculoskeletal:  Good muscle tone,  joints appear normal , no effusions,  normal range of motion    Data Review:    CBC  Recent Labs Lab 05/07/16 1736 05/08/16 0945 05/13/16 2239  WBC 14.3* 9.2 13.8*  HGB 11.3* 9.0* 10.9*  HCT 34.1* 28.3* 33.7*  PLT 416* 316 564*  MCV 91.7 91.6 90.6  MCH 30.4 29.1 29.3  MCHC 33.1 31.8 32.3  RDW 13.6 14.2 13.6  LYMPHSABS 1.3  --   --   MONOABS 1.1*  --   --   EOSABS 0.1  --   --   BASOSABS 0.0  --   --    ------------------------------------------------------------------------------------------------------------------  Chemistries   Recent Labs Lab 05/07/16 1736 05/08/16 0945 05/13/16 2239  NA  136 137 137  K 3.9 4.2 4.1  CL 105 104 103  CO2 24 24 25   GLUCOSE 117* 103* 122*  BUN 10 7 12   CREATININE 0.55 0.62 0.55  CALCIUM 9.1 8.2* 9.1  AST 21  --   --   ALT 44  --   --   ALKPHOS 78  --   --   BILITOT 0.3  --   --    ------------------------------------------------------------------------------------------------------------------  ------------------------------------------------------------------------------------------------------------------  GFR: Estimated Creatinine Clearance: 97.2 mL/min (by C-G formula based on SCr of 0.55 mg/dL). Liver Function Tests:  Recent Labs Lab 05/07/16 1736  AST 21  ALT 44  ALKPHOS 78  BILITOT 0.3  PROT 7.3  ALBUMIN 3.6    Recent Labs Lab 05/07/16 1736  LIPASE 18   No results for input(s): AMMONIA in the last 168 hours. Coagulation Profile: No results for input(s): INR, PROTIME in the last 168 hours. Cardiac Enzymes:  Recent Labs Lab 05/07/16 1736 05/13/16 2239  TROPONINI <0.03 <0.03    ----------------------------------------------------------------------------------------------------------    Imaging Results:    Dg Chest 2 View  Result Date: 05/13/2016 CLINICAL DATA:  Recent hospitalization for chest pain, similar symptoms began tonight. History of COPD, hypertension. EXAM: CHEST  2 VIEW COMPARISON:  Chest radiograph May 07, 2016 FINDINGS: Cardiac silhouette is mildly enlarged and unchanged. Mediastinal silhouette is nonsuspicious. New small LEFT greater than RIGHT pleural effusions. Pulmonary vasculature appears normal. No pneumothorax. LEFT humeral head bone infarct versus enchondroma. Linear lucency projecting RIGHT humeral head may be extraosseous, recommend correlation with point tenderness. IMPRESSION: New small bilateral pleural effusions. Mild cardiomegaly. Electronically Signed   By: Elon Alas M.D.   On: 05/13/2016 23:18   Dg Shoulder Right  Result Date: 05/14/2016 CLINICAL DATA:  Intermittent  chest pain radiating to bilateral shoulders. EXAM: RIGHT SHOULDER - 2+ VIEW COMPARISON:  RIGHT shoulder radiograph March 20, 2010 and chest radiograph May 13, 2016 FINDINGS: The humeral head is well-formed and located. The subacromial, glenohumeral and acromioclavicular joint spaces are intact. Mild acromioclavicular osteoarthrosis. No destructive bony lesions. Soft tissue planes are non-suspicious. IMPRESSION: No acute fracture deformity or dislocation. Electronically Signed   By: Elon Alas M.D.   On: 05/14/2016 00:41   Ct Angio Chest Pe W And/or Wo Contrast  Result Date: 05/14/2016 CLINICAL DATA:  50 y/o F; pericarditis, fever, chest pain, and shortness of breath. EXAM: CT ANGIOGRAPHY CHEST WITH CONTRAST TECHNIQUE: Multidetector CT imaging of the chest was performed using the standard protocol during bolus administration of intravenous contrast. Multiplanar CT image reconstructions and MIPs were obtained to evaluate the vascular anatomy. CONTRAST:  100 cc Isovue 370 COMPARISON:  05/07/2016 CT of the abdomen and pelvis. 04/05/2004 chest radiograph. FINDINGS: Cardiovascular: Satisfactory opacification of the pulmonary arteries to the segmental level. No evidence of pulmonary embolism. Normal heart size. Pericardial effusion is mildly decreased in comparison with prior CT of the abdomen and pelvis. Normal caliber thoracic aorta. Mediastinum/Nodes: Mediastinal lymphadenopathy is likely reactive. Normal thyroid gland. Normal thoracic esophagus. Lungs/Pleura: Increased small left pleural effusion and associated minor left basilar atelectasis. Otherwise clear lungs. Upper Abdomen: Partially visualized hepatosplenomegaly is stable. Hepatic steatosis. Musculoskeletal: Stable sclerotic focus within the left proximal humerus from a Rees 2005, probably a chondroid rest. No acute osseous abnormality. Review of the MIP images confirms the above findings. IMPRESSION: 1. Minor respiratory artifact in the lung  bases. No pulmonary embolus identified. 2. Small pericardial effusion decreased in size. Prominent mediastinal lymph nodes are likely reactive. 3. Small left pleural effusion and left minor basilar atelectasis are increased in size from prior abdomen CT. 4. Hepatosplenomegaly with hepatic steatosis. Electronically Signed   By: Kristine Garbe M.D.   On: 05/14/2016 00:41    My personal review of EKG: Rhythm NSR, nonspecific T-wave inversions noted   Assessment & Plan:    Active Problems:   Pericarditis   1. Chest pain- likely from pericarditis, CT angiogram chest shows small pericardial effusion which has decreased in size.  Will consult cardiology in a.m., continue colchicine, ibuprofen. Morphine when necessary for severe pain. We'll cycle troponin every 6 hours 3 2. Fever- likely from pericarditis, continue anti-inflammatory regimen. 3. Pericarditis- continue colchicine, ibuprofen. 4. Small bilateral pleural effusions- seen on chest x-ray and CTA, which is increased in size from prior studies. Patient's echocardiogram shows grade 2 diastolic dysfunction. Will start low-dose Lasix 20 mg daily. 5. Hepatosplenomegaly/hepatic steatosis- patient has 20 years history of alcohol abuse, quit drinking 8 years ago ,seen on CT chest angiogram, will obtain abdominal ultrasound in a.m. liver enzymes are within normal limits 6. COPD- stable. 7. Thrombocytosis-today platelet count 547, likely reactive. Previous platelets were within normal limits. Follow CBC in a.m.    DVT Prophylaxis-   Lovenox   AM Labs Ordered, also please review Full Orders  Family Communication: Admission, patients condition and plan of care including tests being ordered have been discussed with the patient and her mother and aunt at bedside who indicate understanding and agree with the plan and Code Status.  Code Status: Full code  Admission status: Observation  Time spent in minutes : 60 minutes   LAMA,GAGAN S M.D  on 05/14/2016 at 2:16 AM  Between 7am to 7pm - Pager - [phone]. After 7pm go to [email or web address] - password [email or web address]  [facility] - Office  [phone]

## 2016-05-14 NOTE — Progress Notes (Signed)
Pt IV and telemetry removed, tolerated well.  Reviewed discharge instructions with pt and answered questions at this time.   

## 2016-05-17 NOTE — Progress Notes (Signed)
CARDIOLOGY OFFICE NOTE  Date:  05/18/2016    [name] Date of Birth: 02-28-1966 Medical Record [id number]  PCP:  Sallee Lange, MD  Cardiologist:  Meda Coffee    Chief Complaint  Patient presents with  . Pericardial Effusion    Follow up visit - seen for Dr. Meda Coffee    History of Present Illness: [name] is a 50 y.o. female who presents today for a post hospital visit. Seen for Dr. Meda Coffee.   She has a h/o ADD, bipolar 1 disorder, COPD, depression, GERD, hypertension, impaired fasting glucose, tobacco use disorder.   Recently admitted from 04/29/2016-04/30/2016 for precordial cp that radiated to her neck, left arm and jaw, worsened by lying down, improved by sitting up, worsened by deep inspiration and cough. The pain was relieved by Ibuprofen 800 mg prn. Of note, at that time, the patient was c/o 2-3 weeks of mild sore throat and rhinorrhea. Studies at that time showed neg Trops, Echo with moderate LVH, mild AS/AR, EF [id number], grade 2 diastolic dysfunction. There was no pericardial effusion seen at that time.  Pt returned to [facility] 12/07 complaining of abdominal pain and chest pressure which was debilitating and prevented her from working, CT with moderate pericardial effusion, pt transferred to [facility].  She was felt to have acute pericarditis with an effusion. Unclear etiology, may be related to viral illness 3-4 weeks ago, but needed to r/o other etiologies. Notably, it was not felt to be an acute Type A dissection that had propagated proximally.  She was febrile up to 101.87F. ANA and blood Cx x 2 were performed.  A urine culture was performed and showed multiple species, re-collect recommended. However, she was not complaining of dysuria so it was felt negative. HIV was nonreactive, hemoglobin A1c was within normal limits, as was a TSH. Her sed rate was elevated at 100 and her white count was elevated as well, lending credence to an inflammatory etiology.  Her  troponin was negative. A repeat echocardiogram was performed and there was no sign of tamponade. It also showed diastolic dysfunction, but she was euvolemic.  She was given Toradol in the hospital and then started on ibuprofen and colchicine. Omeprazole was started for GI protection.  Her hemoglobin decreased during her hospital stay and this is felt secondary to blood draws. This is to be followed as an outpatient. There was no additional source of blood loss known. She is to get a CBC at her follow-up office visit.  Her BP med was held. She was placed out of work until January 2nd.   She has been back to the hospital due to recurrent chest pain since this discharge - given short course of steroids.   Comes in today. Here with her mother today. Lots of questions. Remains off her ACE - asking about restarting. Asking about the duration/dosing of steroid therapy - no directions given. Given RX for Klonopin and Tegretol by psyche - asking about taking. Notes she is trying to get her mood "more even". She has been told that she has a fatty liver and an enlarged spleen - has a visit with GI arranged. She is feeling better. Able to lie down. No more chest pain. No belly pain. She is asking about her "heart failure" diagnosis as well. She is apparently not to return to work until January 2nd. She would like to follow in the [place] office going forward.   Past Medical History:  Diagnosis Date  .  Acute pericarditis 05/10/2016  . ADD (attention deficit disorder)   . Bipolar 1 disorder ([place])   . COPD (chronic obstructive pulmonary disease) ([place])   . Depression   . GERD (gastroesophageal reflux disease)   . Hypertension   . IFG (impaired fasting glucose)   . Substance abuse     Past Surgical History:  Procedure Laterality Date  . ORTHOPEDIC SURGERY    . TUBAL LIGATION       Medications: Current Outpatient Prescriptions  Medication Sig Dispense Refill  . albuterol (PROAIR HFA) 108 (90  Base) MCG/ACT inhaler Inhale 2 puffs into the lungs every 6 (six) hours as needed for wheezing or shortness of breath. 1 Inhaler 5  . clonazePAM (KLONOPIN) 0.5 MG tablet Take 1 tablet by mouth at bedtime.    . colchicine 0.6 MG tablet Take 1 tablet (0.6 mg total) by mouth 2 (two) times daily. 60 tablet 3  . HYDROcodone-acetaminophen (NORCO) 5-325 MG tablet Take 1 tablet by mouth every 6 (six) hours as needed for moderate pain. 10 tablet 0  . ibuprofen (ADVIL,MOTRIN) 600 MG tablet Take 1 tablet (600 mg total) by mouth 3 (three) times daily with meals. 50 tablet 0  . loratadine (CLARITIN) 10 MG tablet Take 10 mg by mouth every morning.    Marland Kitchen omeprazole (PRILOSEC) 20 MG capsule Take 1 capsule (20 mg total) by mouth daily.    . predniSONE (DELTASONE) 20 MG tablet Take 2 tablets (40 mg total) by mouth daily with breakfast. 10 tablet 0  . tiotropium (SPIRIVA HANDIHALER) 18 MCG inhalation capsule INHALE THE CONTENTS OF 1 CAPSULE VIA HANDIHALER EVERY DAY 30 capsule 5   No current facility-administered medications for this visit.     Allergies: Allergies  Allergen Reactions  . Gabapentin Hives  . Augmentin [Amoxicillin-Pot Clavulanate] Nausea And Vomiting    Social History: The patient  reports that she has been smoking Cigarettes.  She has been smoking about 1.00 pack per day. She has never used smokeless tobacco. She reports that she does not drink alcohol or use drugs.   Family History: The patient's family history includes Atrial fibrillation in her mother.   Review of Systems: Please see the history of present illness.   Otherwise, the review of systems is positive for none.   All other systems are reviewed and negative.   Physical Exam: VS:  BP 130/80   Pulse 81   Ht 5\' 3"  (1.6 m)   Wt 226 lb 12.8 oz (102.9 kg)   LMP 03/18/2013 (Approximate)   SpO2 96% Comment: at rest  BMI 40.18 kg/m  .  BMI Body mass index is 40.18 kg/m.  Wt Readings from Last 3 Encounters:  05/18/16 226 lb  12.8 oz (102.9 kg)  05/14/16 225 lb 9.6 oz (102.3 kg)  05/09/16 230 lb 11.2 oz (104.6 kg)    General: Pleasant. Obese female who is alert and in no acute distress.   HEENT: Normal.  Neck: Supple, no JVD, carotid bruits, or masses noted.  Cardiac: Regular rate and rhythm. No murmurs, rubs, or gallops. No edema.  Respiratory:  Lungs are clear to auscultation bilaterally with normal work of breathing.  GI: Soft and nontender.  MS: No deformity or atrophy. Gait and ROM intact.  Skin: Warm and dry. Color is normal.  Neuro:  Strength and sensation are intact and no gross focal deficits noted.  Psych: Alert, appropriate and with normal affect.   LABORATORY DATA:  EKG:  EKG is not ordered today.  Lab Results  Component Value Date   WBC 14.4 (H) 05/14/2016   HGB 10.5 (L) 05/14/2016   HCT 32.3 (L) 05/14/2016   PLT 509 (H) 05/14/2016   GLUCOSE 148 (H) 05/14/2016   CHOL 206 (H) 10/08/2014   TRIG 79 10/08/2014   HDL 59 10/08/2014   LDLCALC 131 (H) 10/08/2014   ALT 34 05/14/2016   AST 11 (L) 05/14/2016   NA 137 05/14/2016   K 3.9 05/14/2016   CL 104 05/14/2016   CREATININE 0.58 05/14/2016   BUN 9 05/14/2016   CO2 25 05/14/2016   TSH 1.866 05/08/2016   HGBA1C 5.6 05/08/2016    BNP (last 3 results)  Recent Labs  05/07/16 1736  BNP 77.0    ProBNP (last 3 results) No results for input(s): PROBNP in the last 8760 hours.   Other Studies Reviewed Today:  ECHO: 05/08/2016 - Left ventricle: The cavity size was normal. Systolic function wasnormal. The estimated ejection fraction was in the range of 60%to 65%. Wall motion was normal; there were no regional wallmotion abnormalities. Features are consistent with a pseudonormalleft ventricular filling pattern, with concomitant abnormalrelaxation and increased filling pressure (grade 2 diastolicdysfunction). Doppler parameters are consistent with highventricular filling pressure. - Aortic valve: There was very mild stenosis.  There was mild to moderate regurgitation. Valve area (VTI): 1.2 cm^2. Valve area (Vmax): 1.11 cm^2. Valve area (Vmean): 1.12 cm^2. - Pulmonic valve: There was trivial regurgitation. - Pericardium, extracardiac: A small, free-flowing pericardial effusion was identified along the left ventricular free wall. The fluid had no internal echoes.There was no evidence of hemodynamiccompromise. There was no chamber collapse. Respirophasic changein stroke volume was normal. _____________   Assessment/Plan: 1. Pericardial effusion - currently on steroid therapy, colchicine and Motrin. I have asked her to take her prednisone as follows - 2 pills daily for 4 days, then 1 pill daily for 5 days and then 1/2 pill daily til finished. Will leave on Colchicine and Motrin. Plan for limited echo in a month at [facility]. Arrange follow up in [place] per patient request. She has improved clinically.   2. HTN - off ACE - she can use her mom's BP cuff and follow. If goes above 130/80 consistently - then resume.   3. Fatty liver  4. Diastolic dysfunction - explained that she needs good BP control, weight loss and salt restriction  5. Very mild valvular heart disease - would follow.   6. Blood loss anemia - recheck lab today. She is taking some OTC PPI while on NSAID therapy.   Current medicines are reviewed with the patient today.  The patient does not have concerns regarding medicines other than what has been noted above.  The following changes have been made:  See above.  Labs/ tests ordered today include:    Orders Placed This Encounter  Procedures  . Basic metabolic panel  . CBC  . ECHOCARDIOGRAM LIMITED     Disposition:   FU with Dr. Bronson Ing in [place] per patient request in about 1 month after limited echo.   Patient is agreeable to this plan and will call if any problems develop in the interim.   Signed: Burtis Junes, RN, ANP-C 05/18/2016 3:28 PM  [facility] [address] [place], [place]  [address] Phone: [phone] Fax: [phone]

## 2016-05-18 ENCOUNTER — Telehealth: Payer: Self-pay | Admitting: *Deleted

## 2016-05-18 ENCOUNTER — Encounter (INDEPENDENT_AMBULATORY_CARE_PROVIDER_SITE_OTHER): Payer: Self-pay

## 2016-05-18 ENCOUNTER — Encounter: Payer: Self-pay | Admitting: Nurse Practitioner

## 2016-05-18 ENCOUNTER — Ambulatory Visit (INDEPENDENT_AMBULATORY_CARE_PROVIDER_SITE_OTHER): Payer: Federal, State, Local not specified - PPO | Admitting: Nurse Practitioner

## 2016-05-18 VITALS — BP 130/80 | HR 81 | Ht 63.0 in | Wt 226.8 lb

## 2016-05-18 DIAGNOSIS — I313 Pericardial effusion (noninflammatory): Secondary | ICD-10-CM

## 2016-05-18 DIAGNOSIS — I3139 Other pericardial effusion (noninflammatory): Secondary | ICD-10-CM

## 2016-05-18 NOTE — Patient Instructions (Addendum)
We will be checking the following labs today - BMET and CBC   Medication Instructions:    Continue with your current medicines.   Take the prednisone - 2 pills daily for 4 more days, then 1 pill daily for 5 days and then 1/2 pill daily til finished.     Testing/Procedures To Be Arranged:  N/A  Follow-Up:   See Dr. Bronson Ing in one month after your limited echo.  Cancel follow up with Dr. Harrington Challenger     Other Special Instructions:   Monitor your BP at home - if starts running higher than 130/80 consistently - restart your Lisinopril  Try to limit your salt  Daily exercise is encouraged.     If you need a refill on your cardiac medications before your next appointment, please call your pharmacy.   Call the [place] office at [phone] if you have any questions, problems or concerns.

## 2016-05-18 NOTE — Telephone Encounter (Signed)
Left message on machine for pt to contact the office. Want to move pt's appointment to 12:30 pm today.

## 2016-05-19 ENCOUNTER — Other Ambulatory Visit: Payer: Self-pay | Admitting: *Deleted

## 2016-05-19 DIAGNOSIS — R7989 Other specified abnormal findings of blood chemistry: Secondary | ICD-10-CM

## 2016-05-19 LAB — CBC
HCT: 39.2 % (ref 35.0–45.0)
Hemoglobin: 12.6 g/dL (ref 11.7–15.5)
MCH: 29.3 pg (ref 27.0–33.0)
MCHC: 32.1 g/dL (ref 32.0–36.0)
MCV: 91.2 fL (ref 80.0–100.0)
MPV: 8.9 fL (ref 7.5–12.5)
Platelets: 817 10*3/uL — ABNORMAL HIGH (ref 140–400)
RBC: 4.3 MIL/uL (ref 3.80–5.10)
RDW: 14 % (ref 11.0–15.0)
WBC: 11.8 10*3/uL — ABNORMAL HIGH (ref 3.8–10.8)

## 2016-05-19 LAB — BASIC METABOLIC PANEL
BUN: 18 mg/dL (ref 7–25)
CO2: 28 mmol/L (ref 20–31)
Calcium: 9.7 mg/dL (ref 8.6–10.4)
Chloride: 102 mmol/L (ref 98–110)
Creat: 0.73 mg/dL (ref 0.50–1.05)
Glucose, Bld: 113 mg/dL — ABNORMAL HIGH (ref 65–99)
Potassium: 5.2 mmol/L (ref 3.5–5.3)
Sodium: 138 mmol/L (ref 135–146)

## 2016-05-20 ENCOUNTER — Emergency Department (HOSPITAL_BASED_OUTPATIENT_CLINIC_OR_DEPARTMENT_OTHER)
Admission: RE | Admit: 2016-05-20 | Discharge: 2016-05-20 | Disposition: A | Discharge status: Home / Self Care | Payer: Federal, State, Local not specified - PPO | Source: Ambulatory Visit | Attending: Nurse Practitioner | Admitting: Nurse Practitioner

## 2016-05-20 ENCOUNTER — Emergency Department (HOSPITAL_COMMUNITY)
Admission: EM | Admit: 2016-05-20 | Discharge: 2016-05-20 | Disposition: A | Discharge status: Home / Self Care | Payer: Federal, State, Local not specified - PPO | Attending: Emergency Medicine | Admitting: Emergency Medicine

## 2016-05-20 ENCOUNTER — Emergency Department (HOSPITAL_COMMUNITY): Payer: Federal, State, Local not specified - PPO

## 2016-05-20 ENCOUNTER — Encounter (HOSPITAL_COMMUNITY): Payer: Self-pay | Admitting: Emergency Medicine

## 2016-05-20 DIAGNOSIS — R072 Precordial pain: Secondary | ICD-10-CM | POA: Diagnosis not present

## 2016-05-20 DIAGNOSIS — J449 Chronic obstructive pulmonary disease, unspecified: Secondary | ICD-10-CM | POA: Insufficient documentation

## 2016-05-20 DIAGNOSIS — I313 Pericardial effusion (noninflammatory): Secondary | ICD-10-CM | POA: Diagnosis not present

## 2016-05-20 DIAGNOSIS — R197 Diarrhea, unspecified: Secondary | ICD-10-CM | POA: Diagnosis not present

## 2016-05-20 DIAGNOSIS — F17210 Nicotine dependence, cigarettes, uncomplicated: Secondary | ICD-10-CM | POA: Diagnosis not present

## 2016-05-20 DIAGNOSIS — E860 Dehydration: Secondary | ICD-10-CM

## 2016-05-20 DIAGNOSIS — I3139 Other pericardial effusion (noninflammatory): Secondary | ICD-10-CM

## 2016-05-20 DIAGNOSIS — K2900 Acute gastritis without bleeding: Secondary | ICD-10-CM

## 2016-05-20 DIAGNOSIS — I319 Disease of pericardium, unspecified: Secondary | ICD-10-CM | POA: Diagnosis not present

## 2016-05-20 DIAGNOSIS — I10 Essential (primary) hypertension: Secondary | ICD-10-CM | POA: Insufficient documentation

## 2016-05-20 DIAGNOSIS — R0682 Tachypnea, not elsewhere classified: Secondary | ICD-10-CM | POA: Diagnosis not present

## 2016-05-20 DIAGNOSIS — R05 Cough: Secondary | ICD-10-CM | POA: Diagnosis not present

## 2016-05-20 DIAGNOSIS — R079 Chest pain, unspecified: Secondary | ICD-10-CM

## 2016-05-20 DIAGNOSIS — R1013 Epigastric pain: Secondary | ICD-10-CM | POA: Diagnosis present

## 2016-05-20 DIAGNOSIS — R112 Nausea with vomiting, unspecified: Secondary | ICD-10-CM | POA: Diagnosis not present

## 2016-05-20 DIAGNOSIS — Z79899 Other long term (current) drug therapy: Secondary | ICD-10-CM | POA: Insufficient documentation

## 2016-05-20 LAB — COMPREHENSIVE METABOLIC PANEL
ALK PHOS: 80 U/L (ref 38–126)
ALT: 54 U/L (ref 14–54)
ANION GAP: 11 (ref 5–15)
AST: 18 U/L (ref 15–41)
Albumin: 3.8 g/dL (ref 3.5–5.0)
BILIRUBIN TOTAL: 0.5 mg/dL (ref 0.3–1.2)
BUN: 26 mg/dL — ABNORMAL HIGH (ref 6–20)
CALCIUM: 9.8 mg/dL (ref 8.9–10.3)
CO2: 25 mmol/L (ref 22–32)
Chloride: 102 mmol/L (ref 101–111)
Creatinine, Ser: 0.74 mg/dL (ref 0.44–1.00)
GFR calc non Af Amer: >60 mL/min (ref >60)
GLUCOSE: 115 mg/dL — AB (ref 65–99)
Potassium: 3.8 mmol/L (ref 3.5–5.1)
Sodium: 138 mmol/L (ref 135–145)
TOTAL PROTEIN: 7.1 g/dL (ref 6.5–8.1)

## 2016-05-20 LAB — CBC
HEMATOCRIT: 45.7 % (ref 36.0–46.0)
Hemoglobin: 14.5 g/dL (ref 12.0–15.0)
MCH: 29.4 pg (ref 26.0–34.0)
MCHC: 31.7 g/dL (ref 30.0–36.0)
MCV: 92.5 fL (ref 78.0–100.0)
PLATELETS: 705 10*3/uL — AB (ref 150–400)
RBC: 4.94 MIL/uL (ref 3.87–5.11)
RDW: 14.1 % (ref 11.5–15.5)
WBC: 27.2 10*3/uL — AB (ref 4.0–10.5)

## 2016-05-20 LAB — CBC WITH DIFFERENTIAL/PLATELET
BASOS PCT: 0 %
Basophils Absolute: 0.1 10*3/uL (ref 0.0–0.1)
EOS ABS: 0.2 10*3/uL (ref 0.0–0.7)
EOS PCT: 1 %
HCT: 45.5 % (ref 36.0–46.0)
HEMOGLOBIN: 14.7 g/dL (ref 12.0–15.0)
Lymphocytes Relative: 5 %
Lymphs Abs: 1.7 10*3/uL (ref 0.7–4.0)
MCH: 29.5 pg (ref 26.0–34.0)
MCHC: 32.3 g/dL (ref 30.0–36.0)
MCV: 91.2 fL (ref 78.0–100.0)
MONOS PCT: 7 %
Monocytes Absolute: 2.4 10*3/uL — ABNORMAL HIGH (ref 0.1–1.0)
NEUTROS PCT: 88 %
Neutro Abs: 30.2 10*3/uL — ABNORMAL HIGH (ref 1.7–7.7)
PLATELETS: 817 10*3/uL — AB (ref 150–400)
RBC: 4.99 MIL/uL (ref 3.87–5.11)
RDW: 14.1 % (ref 11.5–15.5)
WBC: 34.5 10*3/uL — AB (ref 4.0–10.5)

## 2016-05-20 LAB — I-STAT TROPONIN, ED
TROPONIN I, POC: 0 ng/mL (ref 0.00–0.08)
Troponin i, poc: 0.01 ng/mL (ref 0.00–0.08)

## 2016-05-20 LAB — TROPONIN I

## 2016-05-20 LAB — ECHOCARDIOGRAM LIMITED

## 2016-05-20 LAB — LIPASE, BLOOD: Lipase: 23 U/L (ref 11–51)

## 2016-05-20 MED ORDER — FAMOTIDINE IN NACL 20-0.9 MG/50ML-% IV SOLN
20.0000 mg | Freq: Once | INTRAVENOUS | Status: AC
  Administered 2016-05-20: 20 mg via INTRAVENOUS
  Filled 2016-05-20: qty 50

## 2016-05-20 MED ORDER — SODIUM CHLORIDE 0.9 % IV BOLUS (SEPSIS)
1000.0000 mL | Freq: Once | INTRAVENOUS | Status: AC
  Administered 2016-05-20: 1000 mL via INTRAVENOUS

## 2016-05-20 MED ORDER — DIPHENHYDRAMINE HCL 50 MG/ML IJ SOLN
25.0000 mg | Freq: Once | INTRAMUSCULAR | Status: AC
  Administered 2016-05-20: 25 mg via INTRAVENOUS
  Filled 2016-05-20: qty 1

## 2016-05-20 MED ORDER — OMEPRAZOLE 20 MG PO CPDR: DELAYED_RELEASE_CAPSULE | ORAL | 0 refills | Status: DC

## 2016-05-20 MED ORDER — FENTANYL CITRATE (PF) 100 MCG/2ML IJ SOLN
25.0000 ug | Freq: Once | INTRAMUSCULAR | Status: DC
  Filled 2016-05-20: qty 2

## 2016-05-20 MED ORDER — LOPERAMIDE HCL 2 MG PO CAPS
4.0000 mg | ORAL_CAPSULE | Freq: Once | ORAL | Status: AC
  Administered 2016-05-20: 4 mg via ORAL
  Filled 2016-05-20: qty 2

## 2016-05-20 MED ORDER — METOCLOPRAMIDE HCL 5 MG/ML IJ SOLN
10.0000 mg | Freq: Once | INTRAMUSCULAR | Status: AC
  Administered 2016-05-20: 10 mg via INTRAVENOUS
  Filled 2016-05-20: qty 2

## 2016-05-20 MED ORDER — LOPERAMIDE HCL 2 MG PO CAPS
2.0000 mg | ORAL_CAPSULE | Freq: Once | ORAL | Status: AC
  Administered 2016-05-20: 2 mg via ORAL
  Filled 2016-05-20: qty 1

## 2016-05-20 MED ORDER — ONDANSETRON HCL 4 MG PO TABS: 4.0000 mg | ORAL_TABLET | Freq: Three times a day (TID) | ORAL | 0 refills | Status: DC | PRN

## 2016-05-20 NOTE — ED Notes (Signed)
Echo at bedside

## 2016-05-20 NOTE — Progress Notes (Signed)
*  PRELIMINARY RESULTS* Echocardiogram 2D Echocardiogram has been performed.  [name] 05/20/2016, 10:21 AM

## 2016-05-20 NOTE — ED Triage Notes (Signed)
Pt was admitted last week for pericarditis.  Having nausea, vomiting, and diarrhea this morning with worsening chest pain.

## 2016-05-20 NOTE — ED Provider Notes (Signed)
Patient turned over to me from the overnight physician Dr. Tomi Bamberger. Patient had initial troponin done and chest x-ray both negative. Patient still has marked leukocytosis and elevated platelets. But improved from the original one. Patient also had echocardiogram done by cardiology was supposed to be done as an outpatient.   She appears to be stable for discharge. Chest x-ray had no acute findings. Troponins 2 were negative.   Fredia Sorrow, MD 05/20/16 1209

## 2016-05-20 NOTE — Discharge Instructions (Signed)
Increase your prilosec to twice a day for the next 2 weeks then back to once a day. The heart burn is from taking the motrin and prednisone which you need right now for your pericarditis. Drink plenty of fluids (clear liquids) the next 12-24 hours then start the bland diet such as toast, crackers, jello, [employer] chicken noodle soup . Use the zofran for nausea or vomiting. Take imodium OTC for diarrhea. Avoid mild products until the diarrhea is gone. Recheck if you get worse. Keep your cardiology appointments.

## 2016-05-20 NOTE — ED Provider Notes (Signed)
[facility] DEPT Provider Note   CSN: [id number] Arrival date & time: 05/20/16  0535  Time seen 06:09 AM   History   Chief Complaint Chief Complaint  Patient presents with  . Emesis  . Chest Pain    HPI [name] is a 50 y.o. female.  HPI  patient reports about 3 AM she had acute onset of vomiting and diarrhea. She estimates she's vomited about 5 times, she denies seeing blood, and has had nonbloody nonblack diarrhea about 20 times. She reports she has some epigastric abdominal pain that is a sharp pain that waxes and wanes. She does report heartburn symptoms earlier in the day. She also states she has some central chest pain that radiates into her back that is "kind of the same" as her recent diagnosis of pericarditis.  Patient was seen at the cardiology office on the 18th and she had been on Motrin and colchicine for pericarditis and they added prednisone.  PCP Dr Wolf[facility] will be seeing Dr Jacinta Shoe  Past Medical History:  Diagnosis Date  . Acute pericarditis 05/10/2016  . ADD (attention deficit disorder)   . Bipolar 1 disorder ([place])   . COPD (chronic obstructive pulmonary disease) ([place])   . Depression   . GERD (gastroesophageal reflux disease)   . Hypertension   . IFG (impaired fasting glucose)   . Substance abuse     Patient Active Problem List   Diagnosis Date Noted  . Pericarditis 05/14/2016  . Acute pericarditis 05/10/2016  . Fever 05/08/2016  . Pericardial effusion 05/07/2016  . Diastolic dysfunction [id number]  . Chest pain 04/29/2016  . Hypokalemia 04/29/2016  . Tobacco use disorder 04/29/2016  . COPD (chronic obstructive pulmonary disease) with emphysema ([place]) 12/20/2015  . CARPAL TUNNEL SYNDROME 04/16/2008  . NUMBNESS/TINGLING 04/16/2008    Past Surgical History:  Procedure Laterality Date  . ORTHOPEDIC SURGERY    . TUBAL LIGATION      OB History    Gravida Para Term Preterm AB Living   2 2 2          SAB TAB Ectopic  Multiple Live Births                   Home Medications    Prior to Admission medications   Medication Sig Start Date End Date Taking? Authorizing Provider  albuterol (PROAIR HFA) 108 (90 Base) MCG/ACT inhaler Inhale 2 puffs into the lungs every 6 (six) hours as needed for wheezing or shortness of breath. 04/15/16  Yes Mikey Kirschner, MD  clonazePAM (KLONOPIN) 0.5 MG tablet Take 1 tablet by mouth at bedtime. 03/16/16  Yes Historical Provider, MD  colchicine 0.6 MG tablet Take 1 tablet (0.6 mg total) by mouth 2 (two) times daily. 05/10/16  Yes Rhonda G Barrett, PA-C  HYDROcodone-acetaminophen (NORCO) 5-325 MG tablet Take 1 tablet by mouth every 6 (six) hours as needed for moderate pain. 05/14/16  Yes Kathie Dike, MD  ibuprofen (ADVIL,MOTRIN) 600 MG tablet Take 1 tablet (600 mg total) by mouth 3 (three) times daily with meals. 05/10/16  Yes Rhonda G Barrett, PA-C  loratadine (CLARITIN) 10 MG tablet Take 10 mg by mouth every morning.   Yes Historical Provider, MD  predniSONE (DELTASONE) 20 MG tablet Take 2 tablets (40 mg total) by mouth daily with breakfast. 05/14/16  Yes Kathie Dike, MD  tiotropium (SPIRIVA HANDIHALER) 18 MCG inhalation capsule INHALE THE CONTENTS OF 1 CAPSULE VIA HANDIHALER EVERY DAY 12/20/15  Yes Kathyrn Drown, MD  omeprazole (PRILOSEC) 20 MG capsule Take 1 po BID x 2 weeks then once a day 05/20/16   Rolland Porter, MD  ondansetron (ZOFRAN) 4 MG tablet Take 1 tablet (4 mg total) by mouth every 8 (eight) hours as needed for nausea or vomiting. 05/20/16   Rolland Porter, MD    Family History Family History  Problem Relation Age of Onset  . Atrial fibrillation Mother     Social History Social History  Substance Use Topics  . Smoking status: Current Every Day Smoker    Packs/day: 1.00    Types: Cigarettes    Last attempt to quit: 04/01/2016  . Smokeless tobacco: Never Used  . Alcohol use No     Allergies   Gabapentin and Augmentin [amoxicillin-pot  clavulanate]   Review of Systems Review of Systems  All other systems reviewed and are negative.    Physical Exam Updated Vital Signs BP 141/60 (BP Location: Left Arm)   Pulse 91   Temp 97.7 F (36.5 C) (Oral)   Resp 17   LMP 03/18/2013 (Approximate)   SpO2 99%   Vital signs normal    Physical Exam  Constitutional: She is oriented to person, place, and time. She appears well-developed and well-nourished.  Non-toxic appearance. She does not appear ill. She appears distressed.  HENT:  Head: Normocephalic and atraumatic.  Right Ear: External ear normal.  Left Ear: External ear normal.  Nose: Nose normal. No mucosal edema or rhinorrhea.  Mouth/Throat: Mucous membranes are normal. No dental abscesses or uvula swelling.  Dry mucous membranes  Eyes: Conjunctivae and EOM are normal. Pupils are equal, round, and reactive to light.  Neck: Normal range of motion and full passive range of motion without pain. Neck supple.  Cardiovascular: Normal rate, regular rhythm and normal heart sounds.  Exam reveals no gallop and no friction rub.   No murmur heard. Pulmonary/Chest: Effort normal and breath sounds normal. No respiratory distress. She has no wheezes. She has no rhonchi. She has no rales. She exhibits no tenderness and no crepitus.  Abdominal: Soft. Normal appearance and bowel sounds are normal. She exhibits no distension. There is no tenderness. There is no rebound and no guarding.  Musculoskeletal: Normal range of motion. She exhibits no edema or tenderness.  Moves all extremities well.   Neurological: She is alert and oriented to person, place, and time. She has normal strength. No cranial nerve deficit.  Skin: Skin is warm, dry and intact. No rash noted. No erythema. There is pallor.  Psychiatric: She has a normal mood and affect. Her speech is normal and behavior is normal. Her mood appears not anxious.  Nursing note and vitals reviewed.    ED Treatments / Results   Labs (all labs ordered are listed, but only abnormal results are displayed) Results for orders placed or performed during the hospital encounter of 05/20/16  Comprehensive metabolic panel  Result Value Ref Range   Sodium 138 135 - 145 mmol/L   Potassium 3.8 3.5 - 5.1 mmol/L   Chloride 102 101 - 111 mmol/L   CO2 25 22 - 32 mmol/L   Glucose, Bld 115 (H) 65 - 99 mg/dL   BUN 26 (H) 6 - 20 mg/dL   Creatinine, Ser 0.74 0.44 - 1.00 mg/dL   Calcium 9.8 8.9 - 10.3 mg/dL   Total Protein 7.1 6.5 - 8.1 g/dL   Albumin 3.8 3.5 - 5.0 g/dL   AST 18 15 - 41 U/L   ALT 54 14 - 54  U/L   Alkaline Phosphatase 80 38 - 126 U/L   Total Bilirubin 0.5 0.3 - 1.2 mg/dL   GFR calc non Af Amer >60 >60 mL/min   GFR calc Af Amer >60 >60 mL/min   Anion gap 11 5 - 15  Lipase, blood  Result Value Ref Range   Lipase 23 11 - 51 U/L  CBC with Differential  Result Value Ref Range   WBC 34.5 (H) 4.0 - 10.5 K/uL   RBC 4.99 3.87 - 5.11 MIL/uL   Hemoglobin 14.7 12.0 - 15.0 g/dL   HCT 45.5 36.0 - 46.0 %   MCV 91.2 78.0 - 100.0 fL   MCH 29.5 26.0 - 34.0 pg   MCHC 32.3 30.0 - 36.0 g/dL   RDW 14.1 11.5 - 15.5 %   Platelets 817 (H) 150 - 400 K/uL   Neutrophils Relative % 88 %   Neutro Abs 30.2 (H) 1.7 - 7.7 K/uL   Lymphocytes Relative 5 %   Lymphs Abs 1.7 0.7 - 4.0 K/uL   Monocytes Relative 7 %   Monocytes Absolute 2.4 (H) 0.1 - 1.0 K/uL   Eosinophils Relative 1 %   Eosinophils Absolute 0.2 0.0 - 0.7 K/uL   Basophils Relative 0 %   Basophils Absolute 0.1 0.0 - 0.1 K/uL  Troponin I  Result Value Ref Range   Troponin I <0.03 <0.03 ng/mL   Laboratory interpretation all normal except elevated BUN c/w dehydration, leukocytosis    EKG  EKG Interpretation  Date/Time:  Wednesday May 20 2016 05:49:09 EST Ventricular Rate:  95 PR Interval:    QRS Duration: 162 QT Interval:  382 QTC Calculation: 481 R Axis:   60 Text Interpretation:  Sinus rhythm Borderline short PR interval Probable left atrial  enlargement Nonspecific intraventricular conduction delay Borderline T abnormalities, lateral leads No significant change since last tracing 13 May 2016 Confirmed by Nyal Schachter  MD-I, Ermine Stebbins ([id number]) on 05/20/2016 5:53:45 AM       Radiology No results found.     Dg Chest 2 View  Result Date: 05/13/2016 CLINICAL DATA:  Recent hospitalization for chest pain, similar symptoms began tonight. History of COPD, hypertension.IMPRESSION: New small bilateral pleural effusions. Mild cardiomegaly. Electronically Signed   By: Elon Alas M.D.   On: 05/13/2016 23:18   Dg Chest 2 View  Result Date: 05/07/2016 CLINICAL DATA:  50 y/o F; upper abdominal pain in central chest pain radiating to the left shoulder. 3 weeks of cough. . IMPRESSION: Mild enlargement of cardiac silhouette, possibly accentuated by technique. Increased interstitial markings, probably pulmonary vascular congestion. Electronically Signed   By: Kristine Garbe M.D.   On: 05/07/2016 19:55   Dg Chest 2 View  Result Date: 04/29/2016 CLINICAL DATA:  Chest pain for 3 weeksIMPRESSION: No active cardiopulmonary disease. Electronically Signed   By: Inez Catalina M.D.   On: 04/29/2016 18:01   Dg Shoulder Right  Result Date: 05/14/2016 CLINICAL DATA:  Intermittent chest pain radiating to bilateral shoulders.  IMPRESSION: No acute fracture deformity or dislocation. Electronically Signed   By: Elon Alas M.D.   On: 05/14/2016 00:41   Ct Angio Chest Pe W And/or Wo Contrast  Result Date: 05/14/2016 CLINICAL DATA:  50 y/o F; pericarditis, fever, chest pain, and shortness of breath. . IMPRESSION: 1. Minor respiratory artifact in the lung bases. No pulmonary embolus identified. 2. Small pericardial effusion decreased in size. Prominent mediastinal lymph nodes are likely reactive. 3. Small left pleural effusion and left minor basilar atelectasis are  increased in size from prior abdomen CT. 4. Hepatosplenomegaly with hepatic steatosis.  Electronically Signed   By: Kristine Garbe M.D.   On: 05/14/2016 00:41   US Abdomen Complete  Result Date: 05/14/2016 CLINICAL DATA:  Liver and splenic prominenceIMPRESSION: Prominent liver. Spleen is toward the upper limits of normal in size. No focal liver or splenic lesions are evident by ultrasound. Small pleural effusions bilaterally. Study otherwise unremarkable. Electronically Signed   By: Lowella Grip III M.D.   On: 05/14/2016 08:25   Ct Abdomen Pelvis W Contrast  Result Date: 05/07/2016 CLINICAL DATA:  Upper abdominal pain, onset this morning.  IMPRESSION: 1. Moderate circumferential pericardial effusion measuring up to 15 mm, new. 2. Mild hepatosplenomegaly and hepatic steatosis. Periportal edema is nonspecific, can be seen with hydration status versus right heart failure, however there is no right heart dilatation on CT. 3. Aortic atherosclerosis. 4. 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 Electronically Signed   By: Jeb Levering M.D.   On: 05/07/2016 20:15    Procedures Procedures (including critical care time)  Medications Ordered in ED Medications  sodium chloride 0.9 % bolus 1,000 mL (not administered)  fentaNYL (SUBLIMAZE) injection 25 mcg (25 mcg Intravenous Not Given 05/20/16 0712)  sodium chloride 0.9 % bolus 1,000 mL (not administered)  sodium chloride 0.9 % bolus 1,000 mL (not administered)  sodium chloride 0.9 % bolus 1,000 mL (1,000 mLs Intravenous New Bag/Given 05/20/16 0649)  metoCLOPramide (REGLAN) injection 10 mg (10 mg Intravenous Given 05/20/16 0649)  diphenhydrAMINE (BENADRYL) injection 25 mg (25 mg Intravenous Given 05/20/16 0649)  famotidine (PEPCID) IVPB 20 mg premix (20 mg Intravenous New Bag/Given 05/20/16 0650)  loperamide (IMODIUM) capsule 4 mg (4 mg Oral Given 05/20/16 0650)     Initial Impression / Assessment and Plan / ED Course  I have reviewed the triage vital signs and the nursing  notes.  Pertinent labs & imaging results that were available during my care of the patient were reviewed by me and considered in my medical decision making (see chart for details).  Clinical Course    Patient was given IV fluids and IV nausea medication.  Recheck at 07:10 AM feeling better, nausea gone, abdomen feels better, chest pain is better. Discussed her test results. Will give more fluids.  I am going to repeat her WBC. Pt is improving, I do not see a need to pursue an elevated WBC such as doing a CT scan at this time in the face of vomiting. If her pain gets worse or her WBC isn't getting lower will reconsider at that time.   Pt turned over to Dr Rogene Houston at change of shift to get repeat CBC results and to make sure she is continuing to improve.   Final Clinical Impressions(s) / ED Diagnoses   Final diagnoses:  Chest pain, unspecified type  Pericarditis, unspecified chronicity, unspecified type  Nausea vomiting and diarrhea  Dehydration  Acute gastritis without hemorrhage, unspecified gastritis type    disposition pending  New Prescriptions New Prescriptions   OMEPRAZOLE (PRILOSEC) 20 MG CAPSULE    Take 1 po BID x 2 weeks then once a day   ONDANSETRON (ZOFRAN) 4 MG TABLET    Take 1 tablet (4 mg total) by mouth every 8 (eight) hours as needed for nausea or vomiting.     Rolland Porter, MD, Barbette Or, MD 05/20/16 [phone]

## 2016-05-20 NOTE — ED Notes (Signed)
Pt requesting imodium. Pt has made 3 loose bowel movements since being here. MD made aware and gave verbal order

## 2016-05-20 NOTE — ED Notes (Signed)
ED Provider at bedside. 

## 2016-05-20 NOTE — ED Notes (Signed)
Pt not given Fentanyl. Pt can barely hold eyes open and slurring speech.

## 2016-05-26 ENCOUNTER — Ambulatory Visit (INDEPENDENT_AMBULATORY_CARE_PROVIDER_SITE_OTHER): Payer: Federal, State, Local not specified - PPO | Admitting: Family Medicine

## 2016-05-26 ENCOUNTER — Encounter: Payer: Self-pay | Admitting: Family Medicine

## 2016-05-26 ENCOUNTER — Other Ambulatory Visit (HOSPITAL_COMMUNITY)
Admission: RE | Admit: 2016-05-26 | Discharge: 2016-05-26 | Disposition: A | Discharge status: Home / Self Care | Payer: Federal, State, Local not specified - PPO | Source: Ambulatory Visit | Attending: Nurse Practitioner | Admitting: Nurse Practitioner

## 2016-05-26 ENCOUNTER — Telehealth: Payer: Self-pay | Admitting: Nurse Practitioner

## 2016-05-26 VITALS — BP 132/82 | Ht 63.0 in | Wt 228.8 lb

## 2016-05-26 DIAGNOSIS — I300 Acute nonspecific idiopathic pericarditis: Secondary | ICD-10-CM | POA: Diagnosis not present

## 2016-05-26 DIAGNOSIS — D473 Essential (hemorrhagic) thrombocythemia: Secondary | ICD-10-CM | POA: Diagnosis not present

## 2016-05-26 LAB — CBC WITH DIFFERENTIAL/PLATELET
Basophils Absolute: 0 10*3/uL (ref 0.0–0.1)
Basophils Relative: 0 %
Eosinophils Absolute: 0 10*3/uL (ref 0.0–0.7)
Eosinophils Relative: 0 %
HCT: 37.2 % (ref 36.0–46.0)
Hemoglobin: 11.8 g/dL — ABNORMAL LOW (ref 12.0–15.0)
Lymphocytes Relative: 6 %
Lymphs Abs: 0.8 10*3/uL (ref 0.7–4.0)
MCH: 29.6 pg (ref 26.0–34.0)
MCHC: 31.7 g/dL (ref 30.0–36.0)
MCV: 93.2 fL (ref 78.0–100.0)
Monocytes Absolute: 0.3 10*3/uL (ref 0.1–1.0)
Monocytes Relative: 2 %
Neutro Abs: 12.9 10*3/uL — ABNORMAL HIGH (ref 1.7–7.7)
Neutrophils Relative %: 92 %
Platelets: 408 10*3/uL — ABNORMAL HIGH (ref 150–400)
RBC: 3.99 MIL/uL (ref 3.87–5.11)
RDW: 14.6 % (ref 11.5–15.5)
WBC: 14 10*3/uL — ABNORMAL HIGH (ref 4.0–10.5)

## 2016-05-26 MED ORDER — HYDROCODONE-ACETAMINOPHEN 5-325 MG PO TABS: 1.0000 | ORAL_TABLET | Freq: Four times a day (QID) | ORAL | 0 refills | Status: DC | PRN

## 2016-05-26 MED ORDER — PREDNISONE 20 MG PO TABS: ORAL_TABLET | ORAL | 0 refills | Status: DC

## 2016-05-26 NOTE — Progress Notes (Signed)
   Subjective:    Patient ID: [name], female    DOB: 1965-09-19, 50 y.o.   MRN: [id number]  HPI Patient arrives for hospital follow up for pericarditis. Patient states she is still having a lot of pain and is unable to sleep. This is a complex patient who is very nice. She's been having intermittent chest pains in the upper chest into the left chest area. She states her breathing overall is doing better than what it was earlier in the month. She states that the prednisone that her cardiologist put her on for her carditis helped for a few days but then as she tapered the medication the pain has become more intense. She states she has a follow-up in early January with the specialists. She wonders what else can be done for her. She denies any high fever chills swelling in the legs. She has not been able to return to work because of the ongoing discomfort she is having  Review of Systems    please see above no vomiting diarrhea no rashes. No abdominal pain. Objective:   Physical Exam Lungs are clear chest wall nontender heart regular pulse normal extremities no edema       Assessment & Plan:  Nonspecific chest pain EKG although it does have a lot of baseline artifact does not show any acute changes  Patient states she did better on the prednisone but she states she doubled up on it and then recently feels like things got worse as she is tapered off of the prednisone.. On previous lab work her sedimentation rate was elevated. Significant testing ordered to look for possibility of autoimmune underlying disorder. Also prednisone taper over the next 12 days. See cardiologist early January. Pain medication as necessary cautioned drowsiness. Patient still unable to work currently. Find no evidence of any type of heart attack.  This dictation will also be forwarded to her cardiologist so they can be aware of what has gone on with this patient. I do not feel she needs to go to the ER with  this I did tell her that if her symptoms get dramatically worse that she should consider moving up her appointment with cardiology or obviously if life-threatening go to the ER

## 2016-05-26 NOTE — Telephone Encounter (Signed)
New message      Pt is at [facility] lab to have labs drawn.  She want to know if you can fax her an order at [phone] to have the labs drawn.  If any problems, please call

## 2016-05-26 NOTE — Telephone Encounter (Signed)
Fax number confirmed. Order for CBC faxed

## 2016-05-27 DIAGNOSIS — I300 Acute nonspecific idiopathic pericarditis: Secondary | ICD-10-CM | POA: Diagnosis not present

## 2016-05-28 LAB — C-REACTIVE PROTEIN: CRP: 56 mg/L — AB (ref 0.0–4.9)

## 2016-05-28 LAB — SEDIMENTATION RATE: SED RATE: 11 mm/h (ref 0–40)

## 2016-05-28 LAB — TSH: TSH: 2.36 u[IU]/mL (ref 0.450–4.500)

## 2016-05-28 LAB — ANA: ANA: NEGATIVE

## 2016-05-29 ENCOUNTER — Encounter: Payer: Self-pay | Admitting: Family Medicine

## 2016-06-02 ENCOUNTER — Encounter: Payer: Federal, State, Local not specified - PPO | Admitting: Internal Medicine

## 2016-06-04 NOTE — Progress Notes (Signed)
Cardiology Office Note   Date:  06/04/2016   ID:  [name], DOB May 14, 1966, MRN [id number]  PCP:  Sallee Lange, MD  Cardiologist: Woodroe Chen, NP   No chief complaint on file.     History of Present Illness: [name] is a 51 y.o. female who presents for ongoing assessment and management of complaints of chest pain with radiation to both shoulders and upper back. It was worsened with deep breathing and lying flat. The patient was treated at [facility] with acute pericarditis. She was discharged on ibuprofen and colchicine. Unfortunately the pain recurred and she did not tolerate it, prompting her to return to the hospital and be seen in the emergency room. The patient was ruled out for ACS, pulmonary embolism. She was prescribed a five-day course of prednisone and advised to continue on ibuprofen and colchicine. She is here for post ER follow-up.  She is here today with ongoing complaints of chest pain. Especially when she takes a deep breath. She has been weaning down off of the prednisone but she states when she gets to the one tablet a day the chest pain comes back. Especially awakens her at 3 AM> she continues to take Motrin and colchicine but it is not been helpful as of late. 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 She cannot take off anymore time from work. She's become very frustrated with the continued pain.   Past Medical History:  Diagnosis Date  . Acute pericarditis 05/10/2016  . ADD (attention deficit disorder)   . Bipolar 1 disorder ([place])   . COPD (chronic obstructive pulmonary disease) ([place])   . Depression   . GERD (gastroesophageal reflux disease)   . Hypertension   . IFG (impaired fasting glucose)   . Substance abuse     Past Surgical History:  Procedure Laterality Date  . ORTHOPEDIC SURGERY    . TUBAL LIGATION       Current Outpatient Prescriptions  Medication Sig  Dispense Refill  . albuterol (PROAIR HFA) 108 (90 Base) MCG/ACT inhaler Inhale 2 puffs into the lungs every 6 (six) hours as needed for wheezing or shortness of breath. 1 Inhaler 5  . clonazePAM (KLONOPIN) 0.5 MG tablet Take 1 tablet by mouth at bedtime.    . colchicine 0.6 MG tablet Take 1 tablet (0.6 mg total) by mouth 2 (two) times daily. 60 tablet 3  . HYDROcodone-acetaminophen (NORCO) 5-325 MG tablet Take 1 tablet by mouth every 6 (six) hours as needed for moderate pain. 35 tablet 0  . ibuprofen (ADVIL,MOTRIN) 600 MG tablet Take 1 tablet (600 mg total) by mouth 3 (three) times daily with meals. 50 tablet 0  . loratadine (CLARITIN) 10 MG tablet Take 10 mg by mouth every morning.    Marland Kitchen omeprazole (PRILOSEC) 20 MG capsule Take 1 po BID x 2 weeks then once a day 60 capsule 0  . ondansetron (ZOFRAN) 4 MG tablet Take 1 tablet (4 mg total) by mouth every 8 (eight) hours as needed for nausea or vomiting. 12 tablet 0  . predniSONE (DELTASONE) 20 MG tablet 3qd for 3d then 2qd for 3d then 1qd for 5d 20 tablet 0  . tiotropium (SPIRIVA HANDIHALER) 18 MCG inhalation capsule INHALE THE CONTENTS OF 1 CAPSULE VIA HANDIHALER EVERY DAY 30 capsule 5   No current facility-administered medications for this visit.     Allergies:   Gabapentin and Augmentin [amoxicillin-pot  clavulanate]    Social History:  The patient  reports that she has been smoking Cigarettes.  She has been smoking about 1.00 pack per day. She has never used smokeless tobacco. She reports that she does not drink alcohol or use drugs.   Family History:  The patient's family history includes Atrial fibrillation in her mother.    ROS: All other systems are reviewed and negative. Unless otherwise mentioned in H&P    PHYSICAL EXAM: VS:  LMP 03/18/2013 (Approximate)  , BMI There is no height or weight on file to calculate BMI. GEN: Well nourished, well developed, in no acute distress Obese.  HEENT: normal  Neck: no JVD, carotid bruits, or  masses Cardiac: RRR; no murmurs, rubs, or gallops,no edema  Respiratory:  Inspiratory wheezes. Bibasilar crackles. GI: soft, nontender, nondistended, + BS MS: no deformity or atrophy  Skin: warm and dry, no rash Neuro:  Strength and sensation are intact Psych: euthymic mood, flat affect  Recent Labs: 05/07/2016: B Natriuretic Peptide 77.0 05/20/2016: ALT 54; BUN 26; Creatinine, Ser 0.74; Potassium 3.8; Sodium 138 05/26/2016: Hemoglobin 11.8; Platelets 408 05/27/2016: TSH 2.360    Lipid Panel    Component Value Date/Time   CHOL 206 (H) 10/08/2014 0827   TRIG 79 10/08/2014 0827   HDL 59 10/08/2014 0827   CHOLHDL 3.5 10/08/2014 0827   LDLCALC 131 (H) 10/08/2014 0827      Wt Readings from Last 3 Encounters:  05/26/16 228 lb 12.8 oz (103.8 kg)  05/18/16 226 lb 12.8 oz (102.9 kg)  05/14/16 225 lb 9.6 oz (102.3 kg)      Other studies Reviewed: Left ventricle: The cavity size was normal. Systolic function was   normal. The estimated ejection fraction was in the range of 60%   to 65%. Wall motion was normal; there were no regional wall   motion abnormalities. - Pericardium, extracardiac: A trivial pericardial effusion was   identified.   ASSESSMENT AND PLAN:  1.  Pericarditis: She continues chronic chest discomfort, despite steroids, anti-inflammatories, and colchicine. She also continues to take Vicodin which she states sometimes helps. She states she is often awakened around 3 AM with chest discomfort. I will give her Tramadol 50 mg one by mouth every 6 hours when necessary pain. She will follow up with primary care for ongoing management of chronic pain. She may need additional steroid taper. I have explained to her that she can have chest pain on and off for up to 6 months...  2. Diastolic dysfunction: Does not appear to have decompensation at this time. Blood pressure is currently well-controlled. No changes in her medication.  3. Tobacco abuse: She states she has stopped  smoking however she occasionally does smoke a cigarette. I've advised her not to do so as this can cause more inflammation in her lungs.  4. COPD: I will check a chest x-ray is I am still hearing wheezes and crackles. She may need to be referred to pulmonologist for continued evaluation. We'll defer to primary care to make that referral  She will follow-up with Dr. Dr. Bronson Ing on next office visit   Current medicines are reviewed at length with the patient today.    Labs/ tests ordered today include: CXR No orders of the defined types were placed in this encounter.    Disposition:   FU with Dr. Bronson Ing on previously scheduled appointment.   Signed, Jory Sims, NP  06/04/2016 4:35 PM    [facility]  [address] [place], [place] [address] Phone: [phone]; Fax: [phone]

## 2016-06-05 ENCOUNTER — Ambulatory Visit (INDEPENDENT_AMBULATORY_CARE_PROVIDER_SITE_OTHER): Payer: Federal, State, Local not specified - PPO | Admitting: Adult Health

## 2016-06-05 ENCOUNTER — Encounter: Payer: Self-pay | Admitting: Adult Health

## 2016-06-05 ENCOUNTER — Ambulatory Visit (HOSPITAL_COMMUNITY)
Admission: RE | Admit: 2016-06-05 | Discharge: 2016-06-05 | Disposition: A | Discharge status: Home / Self Care | Payer: Federal, State, Local not specified - PPO | Source: Ambulatory Visit | Attending: Adult Health | Admitting: Adult Health

## 2016-06-05 VITALS — BP 126/78 | HR 106 | Ht 63.0 in | Wt 221.0 lb

## 2016-06-05 DIAGNOSIS — R062 Wheezing: Secondary | ICD-10-CM

## 2016-06-05 DIAGNOSIS — J984 Other disorders of lung: Secondary | ICD-10-CM | POA: Diagnosis not present

## 2016-06-05 DIAGNOSIS — I309 Acute pericarditis, unspecified: Secondary | ICD-10-CM | POA: Diagnosis not present

## 2016-06-05 DIAGNOSIS — J410 Simple chronic bronchitis: Secondary | ICD-10-CM | POA: Diagnosis not present

## 2016-06-05 MED ORDER — TRAMADOL HCL 50 MG PO TABS: 50.0000 mg | ORAL_TABLET | Freq: Four times a day (QID) | ORAL | 1 refills | Status: DC | PRN

## 2016-06-05 NOTE — Patient Instructions (Signed)
Your physician recommends that you schedule a follow-up appointment with Dr. Shawna Orleans.   Your physician recommends that you continue on your current medications as directed. Please refer to the Current Medication list given to you today.  Start Tramadol 50 mg Take one Every 6 Hours as needed for pain.   Have Chest X-Ray Done Today.  If you need a refill on your cardiac medications before your next appointment, please call your pharmacy.  Thank you for choosing [facility]!

## 2016-06-05 NOTE — Progress Notes (Signed)
Name: [name]    DOB: Nov 03, 1965  Age: 51 y.o.  MR#: [id number]       PCP:  Sallee Lange, MD      Insurance: Payor: [place] / Plan: BCBS/F[employer]ER[employer] PPO / Product Type: *No Product type* /   CC:   No chief complaint on file.   VS Vitals:   06/05/16 1404  BP: 126/78  Pulse: (!) 106  SpO2: 99%  Weight: 221 lb (100.2 kg)  Height: 5\' 3"  (1.6 m)    Weights Current Weight  06/05/16 221 lb (100.2 kg)  05/26/16 228 lb 12.8 oz (103.8 kg)  05/18/16 226 lb 12.8 oz (102.9 kg)    Blood Pressure  BP Readings from Last 3 Encounters:  06/05/16 126/78  05/26/16 132/82  05/20/16 111/81     Admit date:  (Not on file) Last encounter with RMR:  Visit date not found   Allergy Gabapentin and Augmentin [amoxicillin-pot clavulanate]  Current Outpatient Prescriptions  Medication Sig Dispense Refill  . albuterol (PROAIR HFA) 108 (90 Base) MCG/ACT inhaler Inhale 2 puffs into the lungs every 6 (six) hours as needed for wheezing or shortness of breath. 1 Inhaler 5  . clonazePAM (KLONOPIN) 0.5 MG tablet Take 1 tablet by mouth at bedtime.    . colchicine 0.6 MG tablet Take 1 tablet (0.6 mg total) by mouth 2 (two) times daily. 60 tablet 3  . HYDROcodone-acetaminophen (NORCO) 5-325 MG tablet Take 1 tablet by mouth every 6 (six) hours as needed for moderate pain. 35 tablet 0  . loratadine (CLARITIN) 10 MG tablet Take 10 mg by mouth every morning.    Marland Kitchen omeprazole (PRILOSEC) 20 MG capsule Take 1 po BID x 2 weeks then once a day 60 capsule 0  . predniSONE (DELTASONE) 20 MG tablet 3qd for 3d then 2qd for 3d then 1qd for 5d 20 tablet 0  . tiotropium (SPIRIVA HANDIHALER) 18 MCG inhalation capsule INHALE THE CONTENTS OF 1 CAPSULE VIA HANDIHALER EVERY DAY 30 capsule 5   No current facility-administered medications for this visit.     Discontinued Meds:    Medications Discontinued During This Encounter  Medication Reason  . ondansetron (ZOFRAN) 4 MG tablet Error  . ibuprofen  (ADVIL,MOTRIN) 600 MG tablet Error    Patient Active Problem List   Diagnosis Date Noted  . Pericarditis 05/14/2016  . Acute pericarditis 05/10/2016  . Fever 05/08/2016  . Pericardial effusion 05/07/2016  . Diastolic dysfunction 123456  . Chest pain 04/29/2016  . Hypokalemia 04/29/2016  . Tobacco use disorder 04/29/2016  . COPD (chronic obstructive pulmonary disease) with emphysema ([place]) 12/20/2015  . CARPAL TUNNEL SYNDROME 04/16/2008  . NUMBNESS/TINGLING 04/16/2008    LABS    Component Value Date/Time   NA 138 05/20/2016 0619   NA 138 05/18/2016 1553   NA 137 05/14/2016 0326   K 3.8 05/20/2016 0619   K 5.2 05/18/2016 1553   K 3.9 05/14/2016 0326   CL 102 05/20/2016 0619   CL 102 05/18/2016 1553   CL 104 05/14/2016 0326   CO2 25 05/20/2016 0619   CO2 28 05/18/2016 1553   CO2 25 05/14/2016 0326   GLUCOSE 115 (H) 05/20/2016 0619   GLUCOSE 113 (H) 05/18/2016 1553   GLUCOSE 148 (H) 05/14/2016 0326   BUN 26 (H) 05/20/2016 0619   BUN 18 05/18/2016 1553   BUN 9 05/14/2016 0326   CREATININE 0.74 05/20/2016 0619   CREATININE 0.73 05/18/2016 1553   CREATININE 0.58 05/14/2016  0326   CREATININE 0.55 05/13/2016 2239   CALCIUM 9.8 05/20/2016 0619   CALCIUM 9.7 05/18/2016 1553   CALCIUM 9.0 05/14/2016 0326   GFRNONAA >60 05/20/2016 0619   GFRNONAA >60 05/14/2016 0326   GFRNONAA >60 05/13/2016 2239   GFRAA >60 05/20/2016 0619   GFRAA >60 05/14/2016 0326   GFRAA >60 05/13/2016 2239   CMP     Component Value Date/Time   NA 138 05/20/2016 0619   K 3.8 05/20/2016 0619   CL 102 05/20/2016 0619   CO2 25 05/20/2016 0619   GLUCOSE 115 (H) 05/20/2016 0619   BUN 26 (H) 05/20/2016 0619   CREATININE 0.74 05/20/2016 0619   CREATININE 0.73 05/18/2016 1553   CALCIUM 9.8 05/20/2016 0619   PROT 7.1 05/20/2016 0619   ALBUMIN 3.8 05/20/2016 0619   AST 18 05/20/2016 0619   ALT 54 05/20/2016 0619   ALKPHOS 80 05/20/2016 0619   BILITOT 0.5 05/20/2016 0619   GFRNONAA >60  05/20/2016 0619   GFRAA >60 05/20/2016 0619       Component Value Date/Time   WBC 14.0 (H) 05/26/2016 1322   WBC 27.2 (H) 05/20/2016 0803   WBC 34.5 (H) 05/20/2016 0619   HGB 11.8 (L) 05/26/2016 1322   HGB 14.5 05/20/2016 0803   HGB 14.7 05/20/2016 0619   HCT 37.2 05/26/2016 1322   HCT 45.7 05/20/2016 0803   HCT 45.5 05/20/2016 0619   MCV 93.2 05/26/2016 1322   MCV 92.5 05/20/2016 0803   MCV 91.2 05/20/2016 0619    Lipid Panel     Component Value Date/Time   CHOL 206 (H) 10/08/2014 0827   TRIG 79 10/08/2014 0827   HDL 59 10/08/2014 0827   CHOLHDL 3.5 10/08/2014 0827   LDLCALC 131 (H) 10/08/2014 0827    ABG No results found for: PHART, PCO2ART, PO2ART, HCO3, TCO2, ACIDBASEDEF, O2SAT   Lab Results  Component Value Date   TSH 2.360 05/27/2016   BNP (last 3 results)  Recent Labs  05/07/16 1736  BNP 77.0    ProBNP (last 3 results) No results for input(s): PROBNP in the last 8760 hours.  Cardiac Panel (last 3 results) No results for input(s): CKTOTAL, CKMB, TROPONINI, RELINDX in the last 72 hours.  Iron/TIBC/Ferritin/ %Sat No results found for: IRON, TIBC, FERRITIN, IRONPCTSAT   EKG Orders placed or performed in visit on 05/29/16  . EKG     Prior Assessment and Plan Problem List as of 06/05/2016 Reviewed: 05/18/2016  2:57 PM by Truitt Merle, NP     Cardiovascular and Mediastinum   Pericardial effusion   Acute pericarditis   Pericarditis     Respiratory   COPD (chronic obstructive pulmonary disease) with emphysema ([facility])     Nervous and Auditory   CARPAL TUNNEL SYNDROME   NUMBNESS/TINGLING     Other   Chest pain   Hypokalemia   Tobacco use disorder   Diastolic dysfunction   Fever       Imaging: Dg Chest 2 View  Result Date: 05/20/2016 CLINICAL DATA:  Chest pain, nonproductive cough, and nausea for the past 8 hours. History of acute pericarditis, COPD, and hypertension. The patient is a current smoker. EXAM: CHEST  2 VIEW COMPARISON:  CT scan  of the chest of May 13, 2016 and chest x-ray of the same day. FINDINGS: The lungs are adequately inflated. There has been interval clearing of left basilar atelectasis and a small bilateral pleural effusions. The heart is top-normal in size but slightly less conspicuous  today. The pulmonary vascularity is not engorged. The mediastinum is normal in width. The observed bony thorax is unremarkable. Lucency longitudinally oriented in the humeral head is again demonstrated. Correlation with any symptoms over the right shoulder is needed. IMPRESSION: Interval resolution of the pleural effusions and left basilar atelectasis. No evidence of CHF. No significant enlargement of the cardiac silhouette. Linear lucency in the right humeral head is again demonstrated. Correlation with any symptoms referable to the right shoulder is needed. Electronically Signed   By: David  Martinique M.D.   On: 05/20/2016 11:21   Dg Chest 2 View  Result Date: 05/13/2016 CLINICAL DATA:  Recent hospitalization for chest pain, similar symptoms began tonight. History of COPD, hypertension. EXAM: CHEST  2 VIEW COMPARISON:  Chest radiograph May 07, 2016 FINDINGS: Cardiac silhouette is mildly enlarged and unchanged. Mediastinal silhouette is nonsuspicious. New small LEFT greater than RIGHT pleural effusions. Pulmonary vasculature appears normal. No pneumothorax. LEFT humeral head bone infarct versus enchondroma. Linear lucency projecting RIGHT humeral head may be extraosseous, recommend correlation with point tenderness. IMPRESSION: New small bilateral pleural effusions. Mild cardiomegaly. Electronically Signed   By: Elon Alas M.D.   On: 05/13/2016 23:18   Dg Chest 2 View  Result Date: 05/07/2016 CLINICAL DATA:  51 y/o F; upper abdominal pain in central chest pain radiating to the left shoulder. 3 weeks of cough. EXAM: CHEST  2 VIEW COMPARISON:  04/29/2016 chest radiograph. 04/05/2004 chest radiograph. FINDINGS: Mild enlargement of  the cardiac silhouette, possibly accentuated by technique. Increased interstitial markings may represent pulmonary vascular congestion. No focal consolidation. No pleural effusion. Bones are unremarkable. Left proximal humerus sclerotic focus seen in 2005, probably a cartilaginous rest. IMPRESSION: Mild enlargement of cardiac silhouette, possibly accentuated by technique. Increased interstitial markings, probably pulmonary vascular congestion. Electronically Signed   By: Kristine Garbe M.D.   On: 05/07/2016 19:55   Dg Shoulder Right  Result Date: 05/14/2016 CLINICAL DATA:  Intermittent chest pain radiating to bilateral shoulders. EXAM: RIGHT SHOULDER - 2+ VIEW COMPARISON:  RIGHT shoulder radiograph March 20, 2010 and chest radiograph May 13, 2016 FINDINGS: The humeral head is well-formed and located. The subacromial, glenohumeral and acromioclavicular joint spaces are intact. Mild acromioclavicular osteoarthrosis. No destructive bony lesions. Soft tissue planes are non-suspicious. IMPRESSION: No acute fracture deformity or dislocation. Electronically Signed   By: Elon Alas M.D.   On: 05/14/2016 00:41   Ct Angio Chest Pe W And/or Wo Contrast  Result Date: 05/14/2016 CLINICAL DATA:  51 y/o F; pericarditis, fever, chest pain, and shortness of breath. EXAM: CT ANGIOGRAPHY CHEST WITH CONTRAST TECHNIQUE: Multidetector CT imaging of the chest was performed using the standard protocol during bolus administration of intravenous contrast. Multiplanar CT image reconstructions and MIPs were obtained to evaluate the vascular anatomy. CONTRAST:  100 cc Isovue 370 COMPARISON:  05/07/2016 CT of the abdomen and pelvis. 04/05/2004 chest radiograph. FINDINGS: Cardiovascular: Satisfactory opacification of the pulmonary arteries to the segmental level. No evidence of pulmonary embolism. Normal heart size. Pericardial effusion is mildly decreased in comparison with prior CT of the abdomen and pelvis.  Normal caliber thoracic aorta. Mediastinum/Nodes: Mediastinal lymphadenopathy is likely reactive. Normal thyroid gland. Normal thoracic esophagus. Lungs/Pleura: Increased small left pleural effusion and associated minor left basilar atelectasis. Otherwise clear lungs. Upper Abdomen: Partially visualized hepatosplenomegaly is stable. Hepatic steatosis. Musculoskeletal: Stable sclerotic focus within the left proximal humerus from a Rees 2005, probably a chondroid rest. No acute osseous abnormality. Review of the MIP images confirms the above findings. IMPRESSION:  1. Minor respiratory artifact in the lung bases. No pulmonary embolus identified. 2. Small pericardial effusion decreased in size. Prominent mediastinal lymph nodes are likely reactive. 3. Small left pleural effusion and left minor basilar atelectasis are increased in size from prior abdomen CT. 4. Hepatosplenomegaly with hepatic steatosis. Electronically Signed   By: Kristine Garbe M.D.   On: 05/14/2016 00:41   US Abdomen Complete  Result Date: 05/14/2016 CLINICAL DATA:  Liver and splenic prominence EXAM: ABDOMEN ULTRASOUND COMPLETE COMPARISON:  CT abdomen and pelvis May 07, 2016 FINDINGS: Gallbladder: No gallstones or wall thickening visualized. There is no pericholecystic fluid. No sonographic Murphy sign noted by sonographer. Common bile duct: Diameter: 2 mm. There is no intrahepatic, common hepatic, common bile duct dilatation. Liver: No focal lesion identified. Within normal limits in parenchymal echogenicity. Liver is prominent, measuring approximately 18 cm in length. IVC: No abnormality visualized. Pancreas: No mass or inflammatory focus. Spleen: Spleen measures 12.4 x 10.1 x 5.1 cm with a measured splenic volume of 335 cubic cm. No focal splenic lesions are evident. Right Kidney: Length: 12.5 cm. Echogenicity within normal limits. No mass or hydronephrosis visualized. Left Kidney: Length: 11.9 cm. Echogenicity within normal  limits. No mass or hydronephrosis visualized. Abdominal aorta: No aneurysm visualized. Other findings: No demonstrable ascites. There are small pleural effusions bilaterally. IMPRESSION: Prominent liver. Spleen is toward the upper limits of normal in size. No focal liver or splenic lesions are evident by ultrasound. Small pleural effusions bilaterally. Study otherwise unremarkable. Electronically Signed   By: Lowella Grip III M.D.   On: 05/14/2016 08:25   Ct Abdomen Pelvis W Contrast  Result Date: 05/07/2016 CLINICAL DATA:  Upper abdominal pain, onset this morning. EXAM: CT ABDOMEN AND PELVIS WITH CONTRAST TECHNIQUE: Multidetector CT imaging of the abdomen and pelvis was performed using the standard protocol following bolus administration of intravenous contrast. CONTRAST:  177mL ISOVUE-300 IOPAMIDOL (ISOVUE-300) COMPARISON:  CT abdomen/ pelvis 02/12/2015, chest radiographs earlier this day. FINDINGS: Lower chest: Moderate circumferential pericardial effusion measures up to 15 mm in depth. Trace left pleural effusion. Lingular atelectasis. Hepatobiliary: Liver is prominent in size spanning 19.6 cm cranial caudal with borderline mild hepatic steatosis. No focal hepatic lesion. There is mild periportal edema. Gallbladder is distended, no calcified stone. No biliary dilatation. Pancreas: No ductal dilatation or inflammation. Spleen: Mildly enlarged measuring 15.2 x 5.2 x 11.3 cm (volume = 470 cm^3). No focal splenic abnormality. Adrenals/Urinary Tract: No adrenal nodule. Symmetric renal enhancement and excretion. No hydronephrosis. Trace perinephric edema about the lower right kidney. Ureters are decompressed. Urinary bladder is minimally distended. Stomach/Bowel: Stomach is physiologically distended. No small bowel dilatation or inflammation. The appendix is normal. There is an incidental 11 mm fat density lesion in the mid transverse colon, consistent with mural lipoma. No obstruction. This is unchanged from  prior exam. Vascular/Lymphatic: Mild atherosclerosis without aneurysm. The scattered small retroperitoneal lymph nodes. Reproductive: Uterus is bulbous in appearance, possible underlying fibroids. No adnexal mass. Ovaries are quiescent. Other: No ascites or free air. Musculoskeletal: There are no acute or suspicious osseous abnormalities. Degenerative change in the spine with mild degenerative disc disease and facet arthropathy. Hemi transitional lumbosacral anatomy. IMPRESSION: 1. Moderate circumferential pericardial effusion measuring up to 15 mm, new. 2. Mild hepatosplenomegaly and hepatic steatosis. Periportal edema is nonspecific, can be seen with hydration status versus right heart failure, however there is no right heart dilatation on CT. 3. Aortic atherosclerosis. 4. 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  Electronically Signed   By: Jeb Levering M.D.   On: 05/07/2016 20:15

## 2016-06-08 ENCOUNTER — Encounter: Payer: Self-pay | Admitting: Nurse Practitioner

## 2016-06-08 ENCOUNTER — Ambulatory Visit (INDEPENDENT_AMBULATORY_CARE_PROVIDER_SITE_OTHER): Payer: Federal, State, Local not specified - PPO | Admitting: Nurse Practitioner

## 2016-06-08 DIAGNOSIS — R197 Diarrhea, unspecified: Secondary | ICD-10-CM

## 2016-06-08 DIAGNOSIS — R1010 Upper abdominal pain, unspecified: Secondary | ICD-10-CM | POA: Diagnosis not present

## 2016-06-08 DIAGNOSIS — R109 Unspecified abdominal pain: Secondary | ICD-10-CM | POA: Insufficient documentation

## 2016-06-08 DIAGNOSIS — K760 Fatty (change of) liver, not elsewhere classified: Secondary | ICD-10-CM

## 2016-06-08 HISTORY — DX: Diarrhea, unspecified: R19.7

## 2016-06-08 NOTE — Assessment & Plan Note (Addendum)
Fatty liver disease noted on imaging. The patient is already trying to start losing weight. Discussed tenants of weight management, blood pressure control, glycemic control to prevent fatty liver disease. Discussed the natural progression etiology of fatty liver disease should not be controlled up to an possibly including cirrhosis. Her liver function appears good with no transaminitis or elevation in other function such as bilirubin or alkaline phosphatase. This was just done less than a month ago and no need to repeat today.

## 2016-06-08 NOTE — Assessment & Plan Note (Signed)
The patient was referred for abdominal pain which is since resolved. Her abdominal pain was left upper quadrant and possibly associated with her acute pericarditis and subsequent lingering pain from this. She has been on NSAIDs and [place] she continues to take her Prilosec for protection against gastritis and esophagitis in the setting of frequent NSAID use. Return for follow-up in 6 months.  Of note, she is age 51 and due for colonoscopy. She is wanting to hold off for now until her pericarditis resolves and I agree with this idea. We will bring her back in 6 months to schedule a initial, first-ever colonoscopy. This will allow [place] to evaluate her progression of her pericarditis to ensure that she is adequate risk/benefit balance.

## 2016-06-08 NOTE — Progress Notes (Signed)
CC'D TO PCP °

## 2016-06-08 NOTE — Assessment & Plan Note (Signed)
In December the patient had a limited episode of frequent loose stools as well as vomiting. Etiologies include extensive use of antibiotics in the setting of pericarditis versus gastroenteritis. Her symptoms have essentially resolved at this time. However, she does continue to have some "gassy, upset" sensation. I will have her trial at daily probiotic for the next 30 days to see if this helps normalize her bowel function. Also with some intermittent constipation related to pain medicines and I will have her start Colace daily. She will likely no longer be on pain medicines after the beginning of February. Return for follow-up in 6 months to schedule colonoscopy, evaluate progression, and make any other further plans

## 2016-06-08 NOTE — Patient Instructions (Addendum)
1. Continue taking Prilosec acid blocker. 2. Try a daily probiotic for the next 30 days to see if this helps. 3. He can try Colace stool softener over-the-counter. You could also try fiber supplement to help her constipation. 4. Return for follow-up in 6 months to see how you're doing and schedule a colonoscopy.     Fatty Liver Introduction Fatty liver, also called hepatic steatosis or steatohepatitis, is a condition in which too much fat has built up in your liver cells. The liver removes harmful substances from your bloodstream. It produces fluids your body needs. It also helps your body use and store energy from the food you eat. In many cases, fatty liver does not cause symptoms or problems. It is often diagnosed when tests are being done for other reasons. However, over time, fatty liver can cause inflammation that may lead to more serious liver problems, such as scarring of the liver (cirrhosis). What are the causes? Causes of fatty liver may include:  Drinking too much alcohol.  Poor nutrition.  Obesity.  Cushing syndrome.  Diabetes.  Hyperlipidemia.  Pregnancy.  Certain drugs.  Poisons.  Some viral infections. What increases the risk? You may be more likely to develop fatty liver if you:  Abuse alcohol.  Are pregnant.  Are overweight.  Have diabetes.  Have hepatitis.  Have a high triglyceride level. What are the signs or symptoms? Fatty liver often does not cause any symptoms. In cases where symptoms develop, they can include:  Fatigue.  Weakness.  Weight loss.  Confusion.  Abdominal pain.  Yellowing of your skin and the white parts of your eyes (jaundice).  Nausea and vomiting. How is this diagnosed? Fatty liver may be diagnosed by:  Physical exam and medical history.  Blood tests.  Imaging tests, such as an ultrasound, CT scan, or MRI.  Liver biopsy. A small sample of liver tissue is removed using a needle. The sample is then looked  at under a microscope. How is this treated? Fatty liver is often caused by other health conditions. Treatment for fatty liver may involve medicines and lifestyle changes to manage conditions such as:  Alcoholism.  High cholesterol.  Diabetes.  Being overweight or obese. Follow these instructions at home:  Eat a healthy diet as directed by your health care provider.  Exercise regularly. This can help you lose weight and control your cholesterol and diabetes. Talk to your health care provider about an exercise plan and which activities are best for you.  Do not drink alcohol.  Take medicines only as directed by your health care provider. Contact a health care provider if: You have difficulty controlling your:  Blood sugar.  Cholesterol.  Alcohol consumption. Get help right away if:  You have abdominal pain.  You have jaundice.  You have nausea and vomiting. This information is not intended to replace advice given to you by your health care provider. Make sure you discuss any questions you have with your health care provider. Document Released: 07/03/2005 Document Revised: 10/24/2015 Document Reviewed: 09/27/2013  2017 Elsevier     Nonalcoholic Fatty Liver Disease Diet Introduction Nonalcoholic fatty liver disease is a condition that causes fat to accumulate in and around the liver. The disease makes it harder for the liver to work the way that it should. Following a healthy diet can help to keep nonalcoholic fatty liver disease under control. It can also help to prevent or improve conditions that are associated with the disease, such as heart disease, diabetes, high  blood pressure, and abnormal cholesterol levels. Along with regular exercise, this diet:  Promotes weight loss.  Helps to control blood sugar levels.  Helps to improve the way that the body uses insulin. What do I need to know about this diet?  Use the glycemic index (GI) to plan your meals. The index  tells you how quickly a food will raise your blood sugar. Choose low-GI foods. These foods take a longer time to raise blood sugar.  Keep track of how many calories you take in. Eating the right amount of calories will help you to achieve a healthy weight.  You may want to follow a Mediterranean diet. This diet includes a lot of vegetables, lean meats or fish, whole grains, fruits, and healthy oils and fats. What foods can I eat? Grains  Whole grains, such as whole-wheat or whole-grain breads, crackers, tortillas, cereals, and pasta. Stone-ground whole wheat. Pumpernickel bread. Unsweetened oatmeal. Bulgur. Barley. Quinoa. Brown or wild rice. Corn or whole-wheat flour tortillas. Vegetables  Lettuce. Spinach. Peas. Beets. Cauliflower. Cabbage. Broccoli. Carrots. Tomatoes. Squash. Eggplant. Herbs. Peppers. Onions. Cucumbers. Brussels sprouts. Yams and sweet potatoes. Beans. Lentils. Fruits  Bananas. Apples. Oranges. Grapes. Papaya. Mango. Pomegranate. Kiwi. Grapefruit. Cherries. Meats and Other Protein Sources  Seafood and shellfish. Lean meats. Poultry. Tofu. Dairy  Low-fat or fat-free dairy products, such as yogurt, cottage cheese, and cheese. Beverages  Water. Sugar-free drinks. Tea. Coffee. Low-fat or skim milk. Milk alternatives, such as soy or almond milk. Real fruit juice. Condiments  Mustard. Relish. Low-fat, low-sugar ketchup and barbecue sauce. Low-fat or fat-free mayonnaise. Sweets and Desserts  Sugar-free sweets. Fats and Oils  Avocado. Canola or olive oil. Nuts and nut butters. Seeds. The items listed above may not be a complete list of recommended foods or beverages. Contact your dietitian for more options.  What foods are not recommended? Palm oil and coconut oil. Processed foods. Fried foods. Sweetened drinks, such as sweet tea, milkshakes, snow cones, iced sweet drinks, and sodas. Alcohol. Sweets. Foods that contain a lot of salt or sodium. The items listed above may not be  a complete list of foods and beverages to avoid. Contact your dietitian for more information.  This information is not intended to replace advice given to you by your health care provider. Make sure you discuss any questions you have with your health care provider. Document Released: 10/02/2014 Document Revised: 10/24/2015 Document Reviewed: 06/12/2014  2017 Elsevier
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
## 2016-06-22 ENCOUNTER — Encounter: Payer: Self-pay | Admitting: Family Medicine

## 2016-06-22 ENCOUNTER — Ambulatory Visit (INDEPENDENT_AMBULATORY_CARE_PROVIDER_SITE_OTHER): Payer: Federal, State, Local not specified - PPO | Admitting: Family Medicine

## 2016-06-22 VITALS — BP 128/82 | Ht 63.0 in | Wt 221.4 lb

## 2016-06-22 DIAGNOSIS — M549 Dorsalgia, unspecified: Secondary | ICD-10-CM

## 2016-06-22 MED ORDER — MELOXICAM 15 MG PO TABS: 15.0000 mg | ORAL_TABLET | Freq: Every day | ORAL | 2 refills | Status: DC

## 2016-06-22 MED ORDER — TIOTROPIUM BROMIDE MONOHYDRATE 18 MCG IN CAPS: ORAL_CAPSULE | RESPIRATORY_TRACT | 12 refills | Status: DC

## 2016-06-22 NOTE — Progress Notes (Signed)
   Subjective:    Patient ID: LEIRE PHIPPEN, female    DOB: 1965/11/03, 51 y.o.   MRN: [id number]  HPI  Patient arrives for a follow up on depression and pericarditis. Patient sees a specialist for her depression patient states they have her on Tegretol she is going to discuss with them the possibility of switching to Cymbalta  She relates left upper back payment present over the past few weeks hurts with certain movements denies any radiation down the left arms. She does have COPD needs refill on her Spiriva Patient has been counseled to quit smoking Review of Systems See below. Negative for chest pressure tightness pain shortness of breath. Negative for cough wheezing difficulty breathing.    Objective:   Physical Exam Lungs clear heart regular HEENT benign denies chest tightness pressure pain shortness breath does relate left upper back pain denies radiation down the arms denies numbness tingling.       Assessment & Plan:  Left shoulder trapezius pain and discomfort been present over the course of the past few weeks. Stretches recommended warm compresses, try anti-inflammatory if not doing dramatically better over the next 2-3 weeks then notify us and we will help set her up with orthopedics  Pericarditis doing well with cardiology treatment a should back to work  Depression under the care of a specialist  She will follow-up here in approximate 5-6 months

## 2016-07-02 ENCOUNTER — Encounter: Payer: Self-pay | Admitting: Cardiovascular Disease

## 2016-07-02 ENCOUNTER — Ambulatory Visit (INDEPENDENT_AMBULATORY_CARE_PROVIDER_SITE_OTHER): Payer: Federal, State, Local not specified - PPO | Admitting: Cardiovascular Disease

## 2016-07-02 VITALS — BP 142/86 | HR 91 | Ht 62.0 in | Wt 215.0 lb

## 2016-07-02 DIAGNOSIS — G8929 Other chronic pain: Secondary | ICD-10-CM

## 2016-07-02 DIAGNOSIS — I3139 Other pericardial effusion (noninflammatory): Secondary | ICD-10-CM

## 2016-07-02 DIAGNOSIS — R079 Chest pain, unspecified: Secondary | ICD-10-CM

## 2016-07-02 DIAGNOSIS — I313 Pericardial effusion (noninflammatory): Secondary | ICD-10-CM | POA: Diagnosis not present

## 2016-07-02 DIAGNOSIS — Z716 Tobacco abuse counseling: Secondary | ICD-10-CM

## 2016-07-02 MED ORDER — VARENICLINE TARTRATE 0.5 MG X 11 & 1 MG X 42 PO MISC: ORAL | 0 refills | Status: DC

## 2016-07-02 NOTE — Patient Instructions (Signed)
Your physician recommends that you schedule a follow-up appointment in: as needed    Take chantix as directed      Thank you for choosing [facility] !
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
## 2016-08-05 DIAGNOSIS — F331 Major depressive disorder, recurrent, moderate: Secondary | ICD-10-CM | POA: Diagnosis not present

## 2016-08-12 DIAGNOSIS — F331 Major depressive disorder, recurrent, moderate: Secondary | ICD-10-CM | POA: Diagnosis not present

## 2016-09-02 DIAGNOSIS — F331 Major depressive disorder, recurrent, moderate: Secondary | ICD-10-CM | POA: Diagnosis not present

## 2016-09-08 ENCOUNTER — Other Ambulatory Visit: Payer: Self-pay | Admitting: Family Medicine

## 2016-09-13 ENCOUNTER — Other Ambulatory Visit: Payer: Self-pay | Admitting: Family Medicine

## 2016-09-14 ENCOUNTER — Encounter: Payer: Self-pay | Admitting: Nurse Practitioner

## 2016-09-14 ENCOUNTER — Encounter: Payer: Self-pay | Admitting: Family Medicine

## 2016-09-14 ENCOUNTER — Ambulatory Visit (INDEPENDENT_AMBULATORY_CARE_PROVIDER_SITE_OTHER): Payer: Federal, State, Local not specified - PPO | Admitting: Nurse Practitioner

## 2016-09-14 VITALS — BP 132/78 | Temp 98.2°F | Ht 63.0 in | Wt 222.0 lb

## 2016-09-14 DIAGNOSIS — J439 Emphysema, unspecified: Secondary | ICD-10-CM

## 2016-09-14 DIAGNOSIS — J069 Acute upper respiratory infection, unspecified: Secondary | ICD-10-CM | POA: Diagnosis not present

## 2016-09-14 MED ORDER — AZITHROMYCIN 250 MG PO TABS: ORAL_TABLET | ORAL | 0 refills | Status: DC

## 2016-09-14 MED ORDER — HYDROCODONE-HOMATROPINE 5-1.5 MG/5ML PO SYRP: 5.0000 mL | ORAL_SOLUTION | ORAL | 0 refills | Status: DC | PRN

## 2016-09-14 MED ORDER — PREDNISONE 20 MG PO TABS: ORAL_TABLET | ORAL | 0 refills | Status: DC

## 2016-09-16 ENCOUNTER — Encounter: Payer: Self-pay | Admitting: Nurse Practitioner

## 2016-09-16 NOTE — Progress Notes (Signed)
Subjective:  Presents for c/o cough and congestion that began 3 days ago. Low grade fever and chills. Sore throat. Facial area headache. Runny nose. Cough producing brownish tan sputum. Usually uses Proair at most once a day. Now using every couple of hours for wheezing. Just used before office visit. CP with cough. No V/D or abd pain. Taking fluids well.   Objective:   BP 132/78   Temp 98.2 F (36.8 C)   Ht 5\' 3"  (1.6 m)   Wt 222 lb (100.7 kg)   LMP 03/18/2013 (Approximate)   BMI 39.33 kg/m  NAD. Alert, oriented. TMs minimal clear effusion. Pharynx mild erythema. Neck supple with mild anterior adenopathy. Lungs rare faint expiratory wheeze. Occasional congested cough. No tachypnea. Heart RRR.  Assessment:   Problem List Items Addressed This Visit      Respiratory   COPD (chronic obstructive pulmonary disease) with emphysema (HCC)   Relevant Medications   predniSONE (DELTASONE) 20 MG tablet   azithromycin (ZITHROMAX Z-PAK) 250 MG tablet   HYDROcodone-homatropine (HYCODAN) 5-1.5 MG/5ML syrup    Other Visit Diagnoses    Upper respiratory tract infection, unspecified type    -  Primary   Relevant Medications   azithromycin (ZITHROMAX Z-PAK) 250 MG tablet       Plan:   Meds ordered this encounter  Medications  . carbamazepine (TEGRETOL) 200 MG tablet  . predniSONE (DELTASONE) 20 MG tablet    Sig: 3 po qd x 3 d then 2 po qd x 3 d then 1 po qd x 2 d    Dispense:  17 tablet    Refill:  0    Order Specific Question:   Supervising Provider    Answer:   Mikey Kirschner [[id number]]  . azithromycin (ZITHROMAX Z-PAK) 250 MG tablet    Sig: Take 2 tablets (500 mg) on  Day 1,  followed by 1 tablet (250 mg) once daily on Days 2 through 5.    Dispense:  6 each    Refill:  0    Order Specific Question:   Supervising Provider    Answer:   Mikey Kirschner [[id number]]  . HYDROcodone-homatropine (HYCODAN) 5-1.5 MG/5ML syrup    Sig: Take 5 mLs by mouth every 4 (four) hours as needed.    Dispense:   90 mL    Refill:  0    Order Specific Question:   Supervising Provider    Answer:   Mikey Kirschner [[id number]]   Warning signs reviewed. Call back in 72 hours if no improvement. Call or go to ED sooner if worse.

## 2016-09-29 DIAGNOSIS — F331 Major depressive disorder, recurrent, moderate: Secondary | ICD-10-CM | POA: Diagnosis not present

## 2016-09-30 DIAGNOSIS — F331 Major depressive disorder, recurrent, moderate: Secondary | ICD-10-CM | POA: Diagnosis not present

## 2016-10-11 ENCOUNTER — Other Ambulatory Visit: Payer: Self-pay | Admitting: Family Medicine

## 2016-10-27 NOTE — ED Notes (Signed)
Called x1, no answer

## 2016-10-28 ENCOUNTER — Emergency Department (HOSPITAL_COMMUNITY)
Admission: EM | Admit: 2016-10-28 | Discharge: 2016-10-28 | Disposition: A | Discharge status: Home / Self Care | Payer: Federal, State, Local not specified - PPO

## 2016-10-28 NOTE — ED Notes (Signed)
Called x 2 no answer

## 2016-11-02 DIAGNOSIS — F331 Major depressive disorder, recurrent, moderate: Secondary | ICD-10-CM | POA: Diagnosis not present

## 2016-11-24 ENCOUNTER — Encounter (HOSPITAL_COMMUNITY): Payer: Self-pay | Admitting: Emergency Medicine

## 2016-11-24 ENCOUNTER — Emergency Department (HOSPITAL_COMMUNITY)
Admission: EM | Admit: 2016-11-24 | Discharge: 2016-11-24 | Disposition: A | Discharge status: Home / Self Care | Payer: Federal, State, Local not specified - PPO | Attending: Emergency Medicine | Admitting: Emergency Medicine

## 2016-11-24 DIAGNOSIS — W57XXXA Bitten or stung by nonvenomous insect and other nonvenomous arthropods, initial encounter: Secondary | ICD-10-CM | POA: Diagnosis not present

## 2016-11-24 DIAGNOSIS — L03115 Cellulitis of right lower limb: Secondary | ICD-10-CM | POA: Diagnosis not present

## 2016-11-24 DIAGNOSIS — Y998 Other external cause status: Secondary | ICD-10-CM | POA: Diagnosis not present

## 2016-11-24 DIAGNOSIS — Y929 Unspecified place or not applicable: Secondary | ICD-10-CM | POA: Insufficient documentation

## 2016-11-24 DIAGNOSIS — L03113 Cellulitis of right upper limb: Secondary | ICD-10-CM | POA: Diagnosis not present

## 2016-11-24 DIAGNOSIS — Y939 Activity, unspecified: Secondary | ICD-10-CM | POA: Diagnosis not present

## 2016-11-24 DIAGNOSIS — S40861A Insect bite (nonvenomous) of right upper arm, initial encounter: Secondary | ICD-10-CM | POA: Diagnosis present

## 2016-11-24 DIAGNOSIS — J449 Chronic obstructive pulmonary disease, unspecified: Secondary | ICD-10-CM | POA: Insufficient documentation

## 2016-11-24 DIAGNOSIS — Z79899 Other long term (current) drug therapy: Secondary | ICD-10-CM | POA: Insufficient documentation

## 2016-11-24 DIAGNOSIS — I509 Heart failure, unspecified: Secondary | ICD-10-CM | POA: Diagnosis not present

## 2016-11-24 DIAGNOSIS — I110 Hypertensive heart disease with heart failure: Secondary | ICD-10-CM | POA: Diagnosis not present

## 2016-11-24 DIAGNOSIS — F17210 Nicotine dependence, cigarettes, uncomplicated: Secondary | ICD-10-CM | POA: Insufficient documentation

## 2016-11-24 HISTORY — DX: Heart failure, unspecified: I50.9

## 2016-11-24 MED ORDER — SULFAMETHOXAZOLE-TRIMETHOPRIM 800-160 MG PO TABS: 1.0000 | ORAL_TABLET | Freq: Two times a day (BID) | ORAL | 0 refills | Status: AC

## 2016-11-24 MED ORDER — SULFAMETHOXAZOLE-TRIMETHOPRIM 800-160 MG PO TABS
1.0000 | ORAL_TABLET | Freq: Once | ORAL | Status: AC
  Administered 2016-11-24: 1 via ORAL
  Filled 2016-11-24: qty 1
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
## 2016-11-24 NOTE — ED Triage Notes (Signed)
Pt reports insect bite to upper right arm yesterday with initial burning and now dull ache from shoulder to wrist.

## 2016-12-01 DIAGNOSIS — F331 Major depressive disorder, recurrent, moderate: Secondary | ICD-10-CM | POA: Diagnosis not present

## 2016-12-07 ENCOUNTER — Encounter: Payer: Self-pay | Admitting: Gastroenterology

## 2016-12-07 ENCOUNTER — Other Ambulatory Visit: Payer: Self-pay

## 2016-12-07 ENCOUNTER — Ambulatory Visit (INDEPENDENT_AMBULATORY_CARE_PROVIDER_SITE_OTHER): Payer: Federal, State, Local not specified - PPO | Admitting: Gastroenterology

## 2016-12-07 ENCOUNTER — Telehealth: Payer: Self-pay

## 2016-12-07 VITALS — BP 160/84 | HR 81 | Temp 98.3°F | Ht 63.0 in | Wt 238.4 lb

## 2016-12-07 DIAGNOSIS — Z1211 Encounter for screening for malignant neoplasm of colon: Secondary | ICD-10-CM | POA: Insufficient documentation

## 2016-12-07 DIAGNOSIS — K760 Fatty (change of) liver, not elsewhere classified: Secondary | ICD-10-CM | POA: Diagnosis not present

## 2016-12-07 MED ORDER — PEG 3350-KCL-NA BICARB-NACL 420 G PO SOLR: 4000.0000 mL | ORAL | 0 refills | Status: DC

## 2016-12-07 NOTE — Assessment & Plan Note (Signed)
Plan for first ever colonoscopy (screening) in the near future. Plan for deep sedation in the OR given her history of polysubstance abuse in the past.  I have discussed the risks, alternatives, benefits with regards to but not limited to the risk of reaction to medication, bleeding, infection, perforation and the patient is agreeable to proceed. Written consent to be obtained.
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
## 2016-12-07 NOTE — Progress Notes (Signed)
CC'ED TO PCP 

## 2016-12-07 NOTE — Telephone Encounter (Signed)
Tried to call pt to inform of pre-op appt 01/12/17 at 10:00am. No answer, LMOAM. Letter also mailed.

## 2016-12-07 NOTE — Assessment & Plan Note (Signed)
Fatty liver with noted mild hepatosplenomegaly on CT imaging last year. Unfortunately patient has increase in weight. With her heart disease and COPD she is very inactive. She left her significant other and is under a lot of stress. Complains of some lower extremity edema and abdominal fullness which progresses throughout the day and resolves by the morning. Discussed 2 g sodium diet. She is eating quite a bit of prepackaged food. We will see her back in 6 months to follow-up on LFTs/fatty liver. In the interim she needs to try to lose 10-15 pounds, increase her activity as much as possible.  Instructions for fatty liver: Recommend 1-2# weight loss per week until ideal body weight through exercise & diet. Low fat/cholesterol diet.   Avoid sweets, sodas, fruit juices, sweetened beverages like tea, etc. Gradually increase exercise from 15 min daily up to 1 hr per day 5 days/week. Limit alcohol use.

## 2016-12-07 NOTE — Patient Instructions (Signed)
1. Colonoscopy in near future. See separate instructions.  2. Please watch your salt (sodium) intake. You should limit salt to no more than 2000mg  daily especially if you are retaining fluid.    Low-Sodium Eating Plan Sodium, which is an element that makes up salt, helps you maintain a healthy balance of fluids in your body. Too much sodium can increase your blood pressure and cause fluid and waste to be held in your body. Your health care provider or dietitian may recommend following this plan if you have high blood pressure (hypertension), kidney disease, liver disease, or heart failure. Eating less sodium can help lower your blood pressure, reduce swelling, and protect your heart, liver, and kidneys. What are tips for following this plan? General guidelines  Most people on this plan should limit their sodium intake to 1,500-2,000 mg (milligrams) of sodium each day. Reading food labels  The Nutrition Facts label lists the amount of sodium in one serving of the food. If you eat more than one serving, you must multiply the listed amount of sodium by the number of servings.  Choose foods with less than 140 mg of sodium per serving.  Avoid foods with 300 mg of sodium or more per serving. Shopping  Look for lower-sodium products, often labeled as "low-sodium" or "no salt added."  Always check the sodium content even if foods are labeled as "unsalted" or "no salt added".  Buy fresh foods. ? Avoid canned foods and premade or frozen meals. ? Avoid canned, cured, or processed meats  Buy breads that have less than 80 mg of sodium per slice. Cooking  Eat more home-cooked food and less restaurant, buffet, and fast food.  Avoid adding salt when cooking. Use salt-free seasonings or herbs instead of table salt or sea salt. Check with your health care provider or pharmacist before using salt substitutes.  Cook with plant-based oils, such as canola, sunflower, or olive oil. Meal planning  When  eating at a restaurant, ask that your food be prepared with less salt or no salt, if possible.  Avoid foods that contain MSG (monosodium glutamate). MSG is sometimes added to [place] food, bouillon, and some canned foods. What foods are recommended? The items listed may not be a complete list. Talk with your dietitian about what dietary choices are best for you. Grains Low-sodium cereals, including oats, puffed wheat and rice, and shredded wheat. Low-sodium crackers. Unsalted rice. Unsalted pasta. Low-sodium bread. Whole-grain breads and whole-grain pasta. Vegetables Fresh or frozen vegetables. "No salt added" canned vegetables. "No salt added" tomato sauce and paste. Low-sodium or reduced-sodium tomato and vegetable juice. Fruits Fresh, frozen, or canned fruit. Fruit juice. Meats and other protein foods Fresh or frozen (no salt added) meat, poultry, seafood, and fish. Low-sodium canned tuna and salmon. Unsalted nuts. Dried peas, beans, and lentils without added salt. Unsalted canned beans. Eggs. Unsalted nut butters. Dairy Milk. Soy milk. Cheese that is naturally low in sodium, such as ricotta cheese, fresh mozzarella, or Swiss cheese Low-sodium or reduced-sodium cheese. Cream cheese. Yogurt. Fats and oils Unsalted butter. Unsalted margarine with no trans fat. Vegetable oils such as canola or olive oils. Seasonings and other foods Fresh and dried herbs and spices. Salt-free seasonings. Low-sodium mustard and ketchup. Sodium-free salad dressing. Sodium-free light mayonnaise. Fresh or refrigerated horseradish. Lemon juice. Vinegar. Homemade, reduced-sodium, or low-sodium soups. Unsalted popcorn and pretzels. Low-salt or salt-free chips. What foods are not recommended? The items listed may not be a complete list. Talk with your dietitian about  what dietary choices are best for you. Grains Instant hot cereals. Bread stuffing, pancake, and biscuit mixes. Croutons. Seasoned rice or pasta mixes.  Noodle soup cups. Boxed or frozen macaroni and cheese. Regular salted crackers. Self-rising flour. Vegetables Sauerkraut, pickled vegetables, and relishes. Olives. [place] fries. Onion rings. Regular canned vegetables (not low-sodium or reduced-sodium). Regular canned tomato sauce and paste (not low-sodium or reduced-sodium). Regular tomato and vegetable juice (not low-sodium or reduced-sodium). Frozen vegetables in sauces. Meats and other protein foods Meat or fish that is salted, canned, smoked, spiced, or pickled. Bacon, ham, sausage, hotdogs, corned beef, chipped beef, packaged lunch meats, salt pork, jerky, pickled herring, anchovies, regular canned tuna, sardines, salted nuts. Dairy Processed cheese and cheese spreads. Cheese curds. Blue cheese. Feta cheese. String cheese. Regular cottage cheese. Buttermilk. Canned milk. Fats and oils Salted butter. Regular margarine. Ghee. Bacon fat. Seasonings and other foods Onion salt, garlic salt, seasoned salt, table salt, and sea salt. Canned and packaged gravies. Worcestershire sauce. Tartar sauce. Barbecue sauce. Teriyaki sauce. Soy sauce, including reduced-sodium. Steak sauce. Fish sauce. Oyster sauce. Cocktail sauce. Horseradish that you find on the shelf. Regular ketchup and mustard. Meat flavorings and tenderizers. Bouillon cubes. Hot sauce and Tabasco sauce. Premade or packaged marinades. Premade or packaged taco seasonings. Relishes. Regular salad dressings. Salsa. Potato and tortilla chips. Corn chips and puffs. Salted popcorn and pretzels. Canned or dried soups. Pizza. Frozen entrees and pot pies. Summary  Eating less sodium can help lower your blood pressure, reduce swelling, and protect your heart, liver, and kidneys.  Most people on this plan should limit their sodium intake to 1,500-2,000 mg (milligrams) of sodium each day.  Canned, boxed, and frozen foods are high in sodium. Restaurant foods, fast foods, and pizza are also very high in  sodium. You also get sodium by adding salt to food.  Try to cook at home, eat more fresh fruits and vegetables, and eat less fast food, canned, processed, or prepared foods. This information is not intended to replace advice given to you by your health care provider. Make sure you discuss any questions you have with your health care provider. Document Released: 11/07/2001 Document Revised: 05/11/2016 Document Reviewed: 05/11/2016 Elsevier Interactive Patient Education  2017 [employer].

## 2017-01-01 DIAGNOSIS — F331 Major depressive disorder, recurrent, moderate: Secondary | ICD-10-CM | POA: Diagnosis not present

## 2017-01-08 NOTE — Patient Instructions (Signed)
[name]  01/08/2017     @PREFPERIOPPHARMACY @   Your procedure is scheduled on 01/18/2017.  Report to [facility] at 9:30 A.M.  Call this number if you have problems the morning of surgery:  [phone]   Remember:  Do not eat food or drink liquids after midnight.  Take these medicines the morning of surgery with A SIP OF WATER Klonopin, Colchicine, Claritin, Mobic, Spiriva inhaler, Pro Air inhaler and bring with you   Do not wear jewelry, make-up or nail polish.  Do not wear lotions, powders, or perfumes, or deoderant.  Do not shave 48 hours prior to surgery.  Men may shave face and neck.  Do not bring valuables to the hospital.  [facility] is not responsible for any belongings or valuables.  Contacts, dentures or bridgework may not be worn into surgery.  Leave your suitcase in the car.  After surgery it may be brought to your room.  For patients admitted to the hospital, discharge time will be determined by your treatment team.  Patients discharged the day of surgery will not be allowed to drive home.    Please read over the following fact sheets that you were given. Anesthesia Post-op Instructions     PATIENT INSTRUCTIONS POST-ANESTHESIA  IMMEDIATELY FOLLOWING SURGERY:  Do not drive or operate machinery for the first twenty four hours after surgery.  Do not make any important decisions for twenty four hours after surgery or while taking narcotic pain medications or sedatives.  If you develop intractable nausea and vomiting or a severe headache please notify your doctor immediately.  FOLLOW-UP:  Please make an appointment with your surgeon as instructed. You do not need to follow up with anesthesia unless specifically instructed to do so.  WOUND CARE INSTRUCTIONS (if applicable):  Keep a dry clean dressing on the anesthesia/puncture wound site if there is drainage.  Once the wound has quit draining you may leave it open to air.  Generally you should leave the  bandage intact for twenty four hours unless there is drainage.  If the epidural site drains for more than 36-48 hours please call the anesthesia department.  QUESTIONS?:  Please feel free to call your physician or the hospital operator if you have any questions, and they will be happy to assist you.      Colonoscopy, Adult A colonoscopy is an exam to look at the entire large intestine. During the exam, a lubricated, bendable tube is inserted into the anus and then passed into the rectum, colon, and other parts of the large intestine. A colonoscopy is often done as a part of normal colorectal screening or in response to certain symptoms, such as anemia, persistent diarrhea, abdominal pain, and blood in the stool. The exam can help screen for and diagnose medical problems, including:  Tumors.  Polyps.  Inflammation.  Areas of bleeding.  Tell a health care provider about:  Any allergies you have.  All medicines you are taking, including vitamins, herbs, eye drops, creams, and over-the-counter medicines.  Any problems you or family members have had with anesthetic medicines.  Any blood disorders you have.  Any surgeries you have had.  Any medical conditions you have.  Any problems you have had passing stool. What are the risks? Generally, this is a safe procedure. However, problems may occur, including:  Bleeding.  A tear in the intestine.  A reaction to medicines given during the exam.  Infection (rare).  What happens before the procedure? Eating  and drinking restrictions Follow instructions from your health care provider about eating and drinking, which may include:  A few days before the procedure - follow a low-fiber diet. Avoid nuts, seeds, dried fruit, raw fruits, and vegetables.  1-3 days before the procedure - follow a clear liquid diet. Drink only clear liquids, such as clear broth or bouillon, black coffee or tea, clear juice, clear soft drinks or sports drinks,  gelatin dessert, and popsicles. Avoid any liquids that contain red or purple dye.  On the day of the procedure - do not eat or drink anything during the 2 hours before the procedure, or within the time period that your health care provider recommends.  Bowel prep If you were prescribed an oral bowel prep to clean out your colon:  Take it as told by your health care provider. Starting the day before your procedure, you will need to drink a large amount of medicated liquid. The liquid will cause you to have multiple loose stools until your stool is almost clear or light green.  If your skin or anus gets irritated from diarrhea, you may use these to relieve the irritation: ? Medicated wipes, such as adult wet wipes with aloe and vitamin E. ? A skin soothing-product like petroleum jelly.  If you vomit while drinking the bowel prep, take a break for up to 60 minutes and then begin the bowel prep again. If vomiting continues and you cannot take the bowel prep without vomiting, call your health care provider.  General instructions  Ask your health care provider about changing or stopping your regular medicines. This is especially important if you are taking diabetes medicines or blood thinners.  Plan to have someone take you home from the hospital or clinic. What happens during the procedure?  An IV tube may be inserted into one of your veins.  You will be given medicine to help you relax (sedative).  To reduce your risk of infection: ? Your health care team will wash or sanitize their hands. ? Your anal area will be washed with soap.  You will be asked to lie on your side with your knees bent.  Your health care provider will lubricate a long, thin, flexible tube. The tube will have a camera and a light on the end.  The tube will be inserted into your anus.  The tube will be gently eased through your rectum and colon.  Air will be delivered into your colon to keep it open. You may feel  some pressure or cramping.  The camera will be used to take images during the procedure.  A small tissue sample may be removed from your body to be examined under a microscope (biopsy). If any potential problems are found, the tissue will be sent to a lab for testing.  If small polyps are found, your health care provider may remove them and have them checked for cancer cells.  The tube that was inserted into your anus will be slowly removed. The procedure may vary among health care providers and hospitals. What happens after the procedure?  Your blood pressure, heart rate, breathing rate, and blood oxygen level will be monitored until the medicines you were given have worn off.  Do not drive for 24 hours after the exam.  You may have a small amount of blood in your stool.  You may pass gas and have mild abdominal cramping or bloating due to the air that was used to inflate your colon during the  exam.  It is up to you to get the results of your procedure. Ask your health care provider, or the department performing the procedure, when your results will be ready. This information is not intended to replace advice given to you by your health care provider. Make sure you discuss any questions you have with your health care provider. Document Released: 05/15/2000 Document Revised: 03/18/2016 Document Reviewed: 07/30/2015 Elsevier Interactive Patient Education  2018 [employer].

## 2017-01-12 ENCOUNTER — Encounter (HOSPITAL_COMMUNITY): Payer: Self-pay

## 2017-01-12 ENCOUNTER — Encounter (HOSPITAL_COMMUNITY)
Admission: RE | Admit: 2017-01-12 | Discharge: 2017-01-12 | Disposition: A | Discharge status: Home / Self Care | Payer: Federal, State, Local not specified - PPO | Source: Ambulatory Visit | Attending: Internal Medicine | Admitting: Internal Medicine

## 2017-01-15 ENCOUNTER — Telehealth: Payer: Self-pay

## 2017-01-15 ENCOUNTER — Encounter (HOSPITAL_COMMUNITY): Admission: RE | Admit: 2017-01-15 | Payer: Federal, State, Local not specified - PPO | Source: Ambulatory Visit

## 2017-01-15 NOTE — Telephone Encounter (Signed)
Will defer to Dr. Gala Romney to decide. We are currently booked out for OV until mid-October.

## 2017-01-15 NOTE — Telephone Encounter (Signed)
Pt is calling to reschedule her TCS because she can not get off work.Does she need to come back into the office or can we just move her? Please advise

## 2017-01-18 ENCOUNTER — Encounter (HOSPITAL_COMMUNITY): Admission: RE | Payer: Self-pay | Source: Ambulatory Visit

## 2017-01-18 ENCOUNTER — Ambulatory Visit (HOSPITAL_COMMUNITY)
Admission: RE | Admit: 2017-01-18 | Payer: Federal, State, Local not specified - PPO | Source: Ambulatory Visit | Admitting: Internal Medicine

## 2017-01-18 SURGERY — COLONOSCOPY WITH PROPOFOL
Anesthesia: Monitor Anesthesia Care

## 2017-01-18 NOTE — Telephone Encounter (Signed)
Just re-triage and we can schedule - she is already greater than 30 days out

## 2017-01-18 NOTE — Telephone Encounter (Signed)
Routing to Ginger.  

## 2017-01-19 NOTE — Telephone Encounter (Signed)
Gastroenterology Pre-Procedure Review  Request Date: Requesting Physician:   PATIENT REVIEW QUESTIONS: The patient responded to the following health history questions as indicated:    1. Diabetes Melitis: NO 2. Joint replacements in the past 12 months: NO 3. Major health problems in the past 3 months: NO 4. Has an artificial valve or MVP: NO 5. Has a defibrillator: NO 6. Has been advised in past to take antibiotics in advance of a procedure like teeth cleaning: NO 7. Family history of colon cancer: NO 8. Alcohol Use: NO 9. History of sleep apnea: NO 10. History of coronary artery or other vascular stents placed within the last 12 months: NO 11. History of any prior anesthesia complications: NO    MEDICATIONS & ALLERGIES:    Patient reports the following regarding taking any blood thinners:   Plavix? NO Aspirin? NO Coumadin? NO Brilinta? NO Xarelto? NO Eliquis? NO Pradaxa? NO Savaysa? NO Effient? NO  Patient confirms/reports the following medications:  Current Outpatient Prescriptions  Medication Sig Dispense Refill  . clonazePAM (KLONOPIN) 0.5 MG tablet Take 1 tablet by mouth at bedtime.    Marland Kitchen FLUoxetine (PROZAC) 20 MG capsule TK ONE C PO QD  1  . loratadine (CLARITIN) 10 MG tablet Take 10 mg by mouth every morning.    . meloxicam (MOBIC) 15 MG tablet TAKE 1 TABLET(15 MG) BY MOUTH DAILY 30 tablet 0  . PROAIR HFA 108 (90 Base) MCG/ACT inhaler INHALE 2 PUFFS INTO THE LUNGS EVERY 6 HOURS AS NEEDED FOR WHEEZING OR SHORTNESS OF BREATH 8.5 g 0  . tiotropium (SPIRIVA HANDIHALER) 18 MCG inhalation capsule INHALE THE CONTENTS OF 1 CAPSULE VIA HANDIHALER EVERY DAY 30 capsule 12  . colchicine 0.6 MG tablet Take 1 tablet (0.6 mg total) by mouth 2 (two) times daily. (Patient not taking: Reported on 01/19/2017) 60 tablet 3  . polyethylene glycol-electrolytes (TRILYTE) 420 g solution Take 4,000 mLs by mouth as directed. (Patient not taking: Reported on 01/19/2017) 4000 mL 0   No current  facility-administered medications for this visit.     Patient confirms/reports the following allergies:  Allergies  Allergen Reactions  . Gabapentin Hives  . Augmentin [Amoxicillin-Pot Clavulanate] Nausea And Vomiting    No orders of the defined types were placed in this encounter.   AUTHORIZATION INFORMATION Primary Insurance: [place],  [place] #: [id number],  Group #:  Pre-Cert / Josem Kaufmann required:  Pre-Cert / Auth #:   SCHEDULE INFORMATION: Procedure has been scheduled as follows:  Date: , Time:   Location:   This Gastroenterology Pre-Precedure Review Form is being routed to the following provider(s):  Dr.Rourk

## 2017-01-19 NOTE — Telephone Encounter (Signed)
TCS in the OR with RMR.

## 2017-01-19 NOTE — Telephone Encounter (Signed)
LMOM to call back

## 2017-01-19 NOTE — Addendum Note (Signed)
Addended by: Mahala Menghini on: 01/19/2017 01:33 PM   Modules accepted: Orders

## 2017-01-26 DIAGNOSIS — F331 Major depressive disorder, recurrent, moderate: Secondary | ICD-10-CM | POA: Diagnosis not present

## 2017-01-26 NOTE — Telephone Encounter (Signed)
Tried to call with no answer. Letter mailed out

## 2017-02-15 IMAGING — DX DG CHEST 2V
2 series · 2 of 2 positions shown · non-contrast
Comparison: 03/18/2016

CLINICAL DATA: Chest pain for 3 weeks

EXAM:
CHEST  2 VIEW

[chest pa]
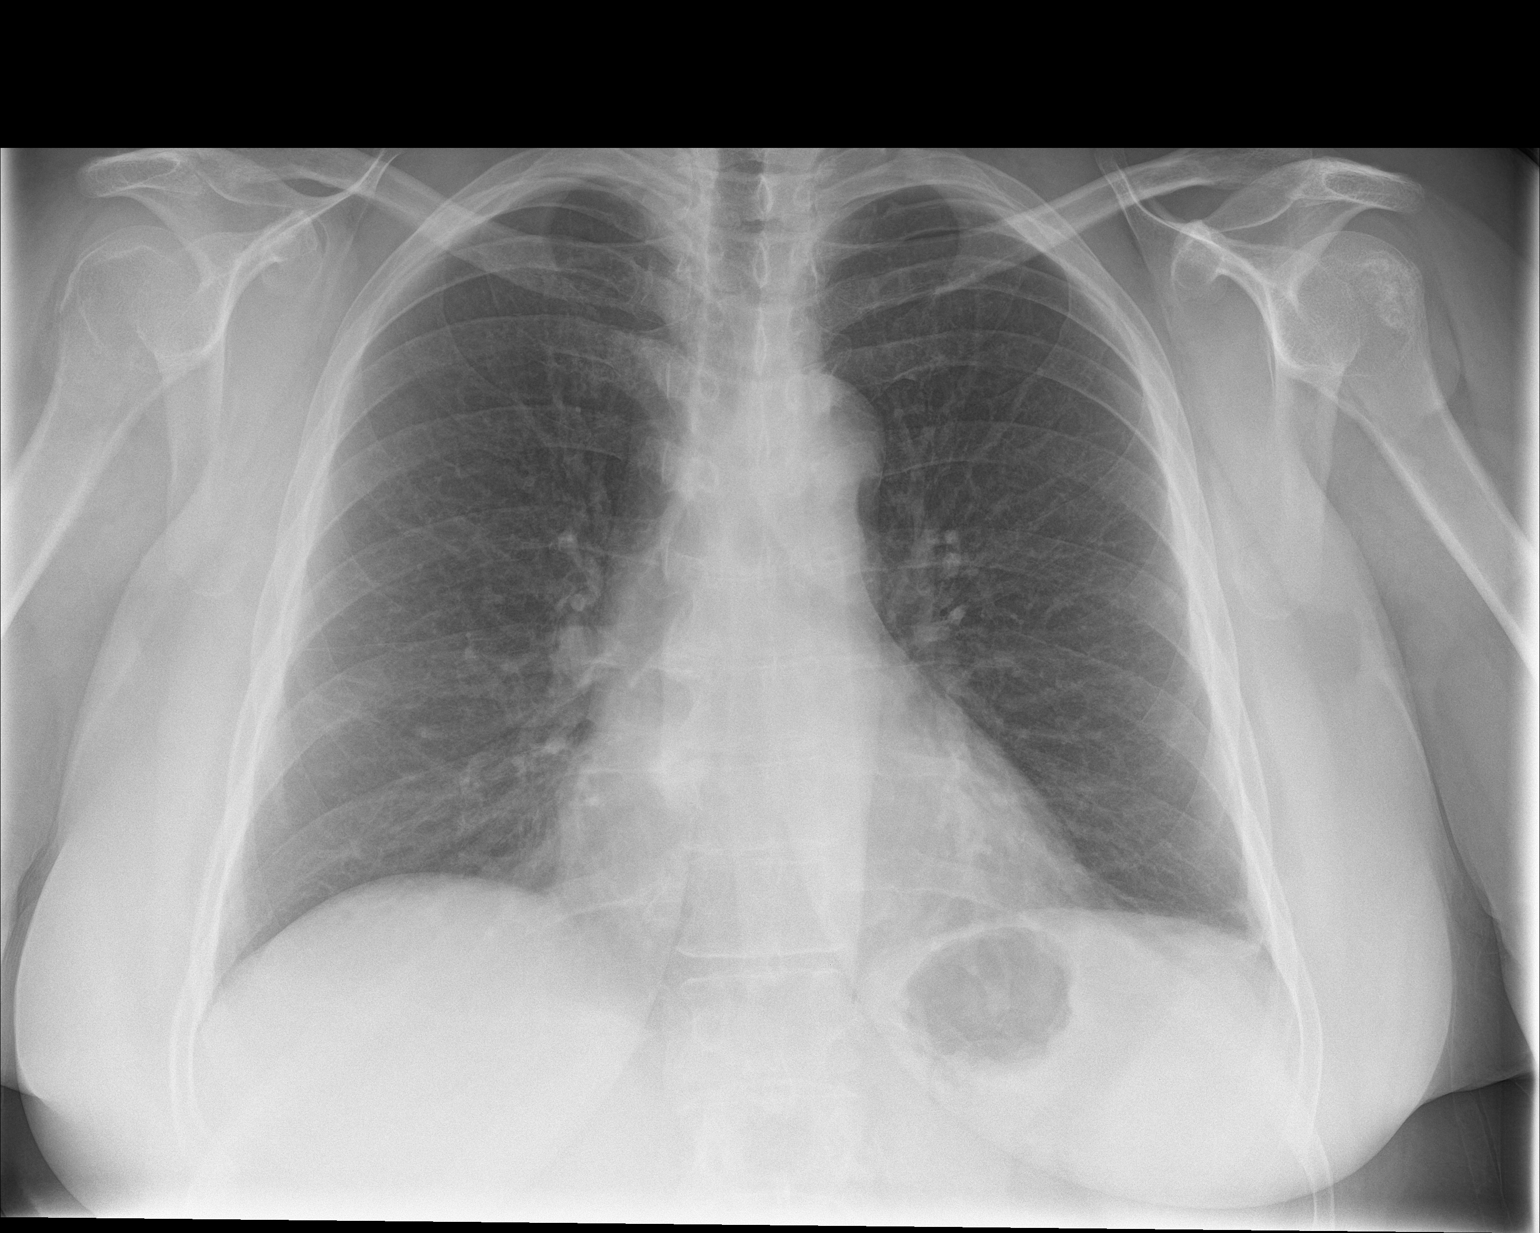

[chest lat]
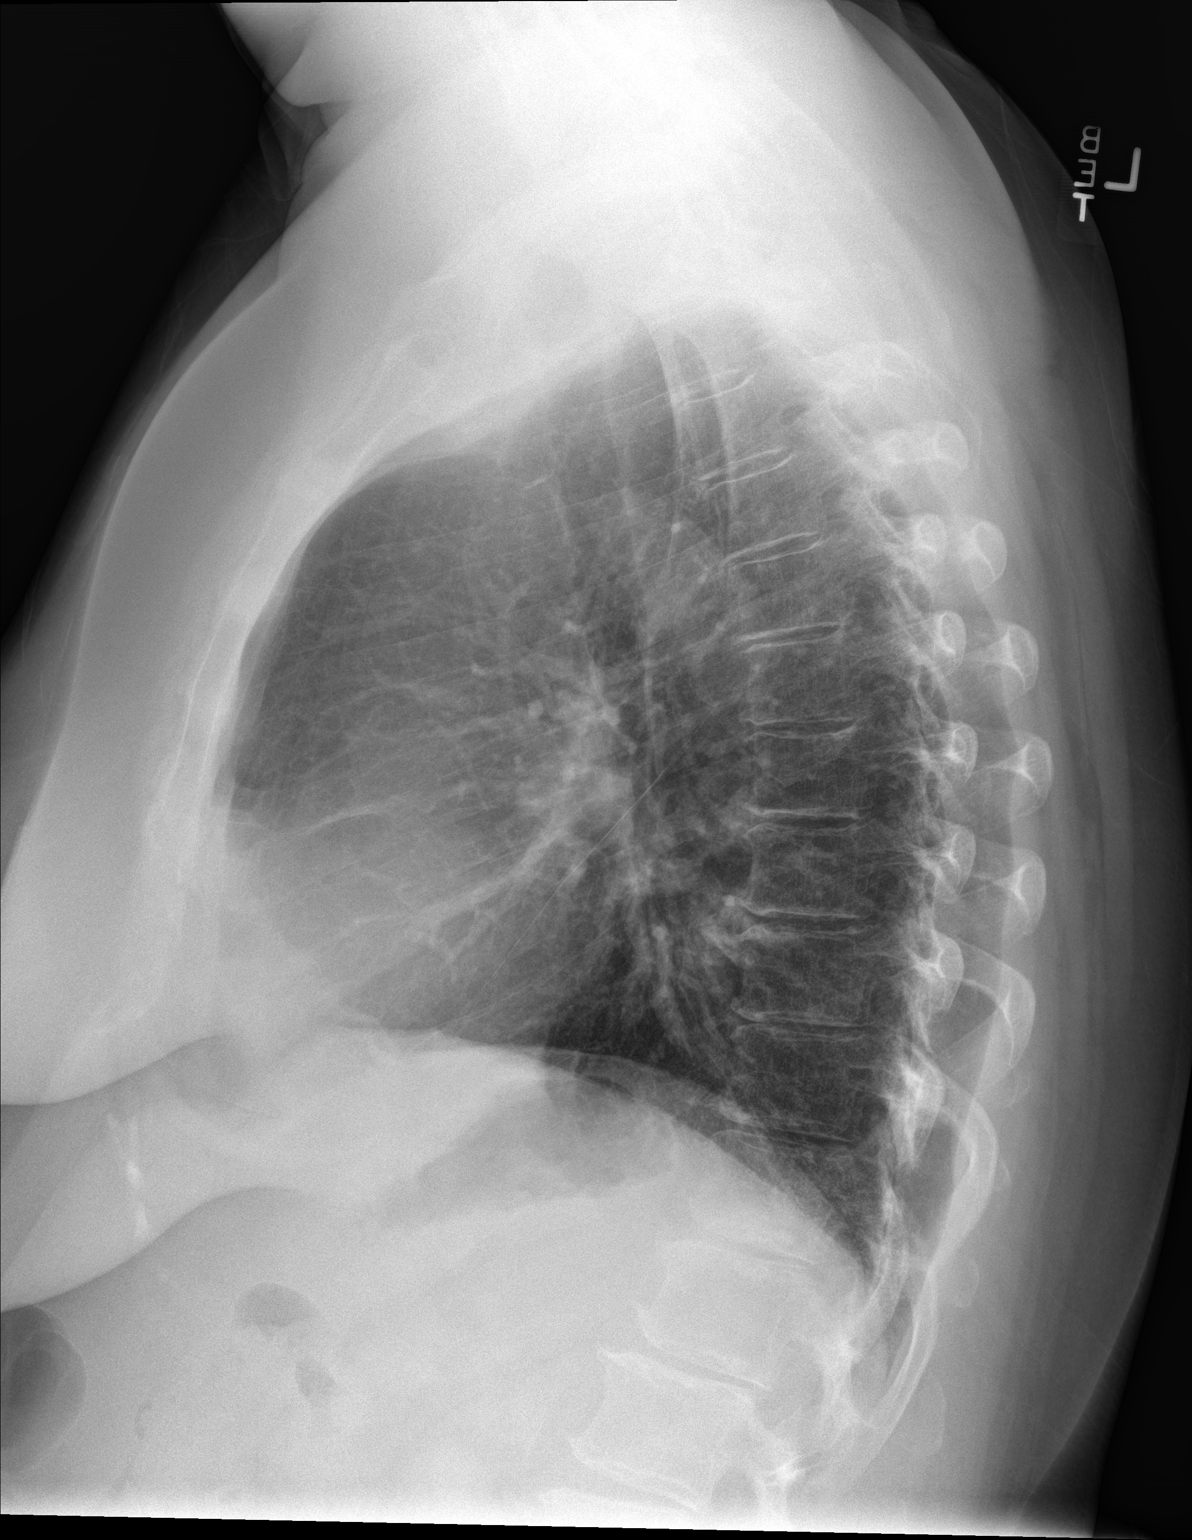

[2 of 2 positions shown; findings below may reference images not displayed]

FINDINGS: The heart size and mediastinal contours are within normal limits.
Both lungs are clear. The visualized skeletal structures are
unremarkable.
IMPRESSION: No active cardiopulmonary disease.

## 2017-02-19 ENCOUNTER — Ambulatory Visit: Payer: Federal, State, Local not specified - PPO | Admitting: Family Medicine

## 2017-02-26 ENCOUNTER — Encounter: Payer: Self-pay | Admitting: Family Medicine

## 2017-02-26 ENCOUNTER — Ambulatory Visit (INDEPENDENT_AMBULATORY_CARE_PROVIDER_SITE_OTHER): Payer: Federal, State, Local not specified - PPO | Admitting: Family Medicine

## 2017-02-26 VITALS — BP 130/82 | Ht 63.0 in | Wt 234.4 lb

## 2017-02-26 DIAGNOSIS — J439 Emphysema, unspecified: Secondary | ICD-10-CM

## 2017-02-26 DIAGNOSIS — M549 Dorsalgia, unspecified: Secondary | ICD-10-CM | POA: Diagnosis not present

## 2017-02-26 DIAGNOSIS — D72829 Elevated white blood cell count, unspecified: Secondary | ICD-10-CM

## 2017-02-26 DIAGNOSIS — Z13220 Encounter for screening for lipoid disorders: Secondary | ICD-10-CM

## 2017-02-26 DIAGNOSIS — Z720 Tobacco use: Secondary | ICD-10-CM | POA: Diagnosis not present

## 2017-02-26 DIAGNOSIS — Z79899 Other long term (current) drug therapy: Secondary | ICD-10-CM

## 2017-02-26 DIAGNOSIS — D692 Other nonthrombocytopenic purpura: Secondary | ICD-10-CM | POA: Diagnosis not present

## 2017-02-26 MED ORDER — VARENICLINE TARTRATE 1 MG PO TABS: 1.0000 mg | ORAL_TABLET | Freq: Two times a day (BID) | ORAL | 0 refills | Status: DC

## 2017-02-26 MED ORDER — PREDNISONE 20 MG PO TABS
ORAL_TABLET | ORAL | 0 refills | Status: DC
Start: 2017-02-26 — End: 2017-03-02

## 2017-02-26 MED ORDER — VARENICLINE TARTRATE 0.5 MG PO TABS: 0.5000 mg | ORAL_TABLET | Freq: Two times a day (BID) | ORAL | 5 refills | Status: DC

## 2017-02-26 MED ORDER — CHLORZOXAZONE 500 MG PO TABS: ORAL_TABLET | ORAL | 0 refills | Status: DC

## 2017-02-26 NOTE — Patient Instructions (Addendum)
[facility]   Check with your mental health doctor regarding using your Chantix    Steps to Quit Smoking Smoking tobacco can be harmful to your health and can affect almost every organ in your body. Smoking puts you, and those around you, at risk for developing many serious chronic diseases. Quitting smoking is difficult, but it is one of the best things that you can do for your health. It is never too late to quit. What are the benefits of quitting smoking? When you quit smoking, you lower your risk of developing serious diseases and conditions, such as:  Lung cancer or lung disease, such as COPD.  Heart disease.  Stroke.  Heart attack.  Infertility.  Osteoporosis and bone fractures.  Additionally, symptoms such as coughing, wheezing, and shortness of breath may get better when you quit. You may also find that you get sick less often because your body is stronger at fighting off colds and infections. If you are pregnant, quitting smoking can help to reduce your chances of having a baby of low birth weight. How do I get ready to quit? When you decide to quit smoking, create a plan to make sure that you are successful. Before you quit:  Pick a date to quit. Set a date within the next two weeks to give you time to prepare.  Write down the reasons why you are quitting. Keep this list in places where you will see it often, such as on your bathroom mirror or in your car or wallet.  Identify the people, places, things, and activities that make you want to smoke (triggers) and avoid them. Make sure to take these actions: ? Throw away all cigarettes at home, at work, and in your car. ? Throw away smoking accessories, such as Scientist, research (medical). ? Clean your car and make sure to empty the ashtray. ? Clean your home, including curtains and carpets.  Tell your family, friends, and coworkers that you are quitting. Support from your loved ones can make quitting  easier.  Talk with your health care provider about your options for quitting smoking.  Find out what treatment options are covered by your health insurance.  What strategies can I use to quit smoking? Talk with your healthcare provider about different strategies to quit smoking. Some strategies include:  Quitting smoking altogether instead of gradually lessening how much you smoke over a period of time. Research shows that quitting "cold [place]" is more successful than gradually quitting.  Attending in-person counseling to help you build problem-solving skills. You are more likely to have success in quitting if you attend several counseling sessions. Even short sessions of 10 minutes can be effective.  Finding resources and support systems that can help you to quit smoking and remain smoke-free after you quit. These resources are most helpful when you use them often. They can include: ? Online chats with a Social worker. ? Telephone quitlines. ? Careers information officer. ? Support groups or group counseling. ? Text messaging programs. ? Mobile phone applications.  Taking medicines to help you quit smoking. (If you are pregnant or breastfeeding, talk with your health care provider first.) Some medicines contain nicotine and some do not. Both types of medicines help with cravings, but the medicines that include nicotine help to relieve withdrawal symptoms. Your health care provider may recommend: ? Nicotine patches, gum, or lozenges. ? Nicotine inhalers or sprays. ? Non-nicotine medicine that is taken by mouth.  Talk with your health care provider about  combining strategies, such as taking medicines while you are also receiving in-person counseling. Using these two strategies together makes you more likely to succeed in quitting than if you used either strategy on its own. If you are pregnant or breastfeeding, talk with your health care provider about finding counseling or other support  strategies to quit smoking. Do not take medicine to help you quit smoking unless told to do so by your health care provider. What things can I do to make it easier to quit? Quitting smoking might feel overwhelming at first, but there is a lot that you can do to make it easier. Take these important actions:  Reach out to your family and friends and ask that they support and encourage you during this time. Call telephone quitlines, reach out to support groups, or work with a counselor for support.  Ask people who smoke to avoid smoking around you.  Avoid places that trigger you to smoke, such as bars, parties, or smoke-break areas at work.  Spend time around people who do not smoke.  Lessen stress in your life, because stress can be a smoking trigger for some people. To lessen stress, try: ? Exercising regularly. ? Deep-breathing exercises. ? Yoga. ? Meditating. ? Performing a body scan. This involves closing your eyes, scanning your body from head to toe, and noticing which parts of your body are particularly tense. Purposefully relax the muscles in those areas.  Download or purchase mobile phone or tablet apps (applications) that can help you stick to your quit plan by providing reminders, tips, and encouragement. There are many free apps, such as QuitGuide from the [employer] Office manager for Disease Control and Prevention). You can find other support for quitting smoking (smoking cessation) through smokefree.gov and other websites.  How will I feel when I quit smoking? Within the first 24 hours of quitting smoking, you may start to feel some withdrawal symptoms. These symptoms are usually most noticeable 2-3 days after quitting, but they usually do not last beyond 2-3 weeks. Changes or symptoms that you might experience include:  Mood swings.  Restlessness, anxiety, or irritation.  Difficulty concentrating.  Dizziness.  Strong cravings for sugary foods in addition to nicotine.  Mild weight  gain.  Constipation.  Nausea.  Coughing or a sore throat.  Changes in how your medicines work in your body.  A depressed mood.  Difficulty sleeping (insomnia).  After the first 2-3 weeks of quitting, you may start to notice more positive results, such as:  Improved sense of smell and taste.  Decreased coughing and sore throat.  Slower heart rate.  Lower blood pressure.  Clearer skin.  The ability to breathe more easily.  Fewer sick days.  Quitting smoking is very challenging for most people. Do not get discouraged if you are not successful the first time. Some people need to make many attempts to quit before they achieve long-term success. Do your best to stick to your quit plan, and talk with your health care provider if you have any questions or concerns. This information is not intended to replace advice given to you by your health care provider. Make sure you discuss any questions you have with your health care provider. Document Released: 05/12/2001 Document Revised: 01/14/2016 Document Reviewed: 10/02/2014 Elsevier Interactive Patient Education  2017 [employer].

## 2017-02-26 NOTE — Progress Notes (Signed)
Subjective:    Patient ID: [name], female    DOB: Mar 27, 1966, 50 y.o.   MRN: [id number]  HPI Patient in today for a 8 month follow up. Patient also has concerns of smoking, rash and back pain. This patient states that she's been back into smoking she know she needs to quit greatly increases risk of other health problems including worsening of COPD she does not want to continue smoking and she is requesting Chantix. She states that her depression is under good control. She is not suicidal.  She does state she itches her skin a lot that she thinks is probably related to anxiousness her therapists and psychiatrists are working with her. She does itch her skin a lot and it causes bruising throughout the skin there is been no weight loss associated with this no fevers or night sweats.  She does have a history of leukocytosis and raises into question if there could be a hematologic issue going on This patient does have COPD and she uses her Spiriva on a regular basis  She also relates right side pain and discomfort hurts with certain movements. This started approximately a week and a half to 2 weeks ago) gave her a bear hug and it felt like a pulled muscle she denies any severe pain she does not think she broke a rib Review of Systems  Constitutional: Negative for activity change, fatigue and fever.  HENT: Negative for congestion, ear pain and rhinorrhea.   Eyes: Negative for discharge.  Respiratory: Negative for cough, chest tightness, shortness of breath and wheezing.   Cardiovascular: Negative for chest pain and leg swelling.  Gastrointestinal: Negative for abdominal pain, constipation and diarrhea.  Genitourinary: Negative for dysuria and frequency.  Musculoskeletal: Positive for arthralgias and back pain. Negative for gait problem, joint swelling and myalgias.  Skin: Negative for color change.  Neurological: Negative for seizures, numbness and headaches.    Psychiatric/Behavioral: Negative for behavioral problems.       Objective:   Physical Exam Neck no masses are felt Lungs are clear there are no crackles rails or rhonchi Heart regular no murmurs or gallops Skin warm dry but does have purpura on the arms and also has excoriated areas where the patient has excessively itched her skin No pedal edema joint grossly normal neurologic grossly normal       Assessment & Plan:  Purpura-check CBC-triggered by her itching but still raises into question if there is underlying issue  COPD stable if the right side thoracic pain continues despite conservative measures x-rays indicated if not better over the next 7-14 days patient is to let us know sooner problems  Significant underlying anxiety issue follow-up with psychiatry on regular basis  Scapula muscle strain massage warm compresses prednisone taper hold anti-inflammatory while on this  Encourage patient to quit smoking Chantix prescribed after discussing risks and benefits including potential for depression suicidal ideation in warning signs and instruction quit medicine should this occur-The patient was also warned that this could cause depression and worsening of psychiatric illness and that if she should have any of these symptoms she should immediately stop medication and follow-up with her psychiatrist she may also follow-up with [place] if any problems or ER.   Patient should talk with her psychiatrist before starting Chantix  Morbid obesity we discussed possibility of surgical aspirate bypass she will look into this if she needs a referral she will let us know  History leukocytosis repeat CBC  Follow-up if not  improving over the next month otherwise follow-up in 4-5 months

## 2017-02-28 ENCOUNTER — Emergency Department (HOSPITAL_COMMUNITY): Payer: Federal, State, Local not specified - PPO

## 2017-02-28 ENCOUNTER — Encounter (HOSPITAL_COMMUNITY): Payer: Self-pay | Admitting: Cardiology

## 2017-02-28 ENCOUNTER — Emergency Department (HOSPITAL_COMMUNITY)
Admission: EM | Admit: 2017-02-28 | Discharge: 2017-02-28 | Disposition: A | Discharge status: Home / Self Care | Payer: Federal, State, Local not specified - PPO | Attending: Emergency Medicine | Admitting: Emergency Medicine

## 2017-02-28 DIAGNOSIS — Z79899 Other long term (current) drug therapy: Secondary | ICD-10-CM | POA: Diagnosis not present

## 2017-02-28 DIAGNOSIS — I110 Hypertensive heart disease with heart failure: Secondary | ICD-10-CM | POA: Insufficient documentation

## 2017-02-28 DIAGNOSIS — I5032 Chronic diastolic (congestive) heart failure: Secondary | ICD-10-CM | POA: Diagnosis not present

## 2017-02-28 DIAGNOSIS — Y990 Civilian activity done for income or pay: Secondary | ICD-10-CM | POA: Insufficient documentation

## 2017-02-28 DIAGNOSIS — Y33XXXA Other specified events, undetermined intent, initial encounter: Secondary | ICD-10-CM | POA: Diagnosis not present

## 2017-02-28 DIAGNOSIS — Y9389 Activity, other specified: Secondary | ICD-10-CM | POA: Diagnosis not present

## 2017-02-28 DIAGNOSIS — Y9289 Other specified places as the place of occurrence of the external cause: Secondary | ICD-10-CM | POA: Insufficient documentation

## 2017-02-28 DIAGNOSIS — S2241XA Multiple fractures of ribs, right side, initial encounter for closed fracture: Secondary | ICD-10-CM | POA: Insufficient documentation

## 2017-02-28 DIAGNOSIS — F17210 Nicotine dependence, cigarettes, uncomplicated: Secondary | ICD-10-CM | POA: Diagnosis not present

## 2017-02-28 DIAGNOSIS — R7989 Other specified abnormal findings of blood chemistry: Secondary | ICD-10-CM | POA: Diagnosis not present

## 2017-02-28 DIAGNOSIS — R079 Chest pain, unspecified: Secondary | ICD-10-CM | POA: Diagnosis not present

## 2017-02-28 DIAGNOSIS — J449 Chronic obstructive pulmonary disease, unspecified: Secondary | ICD-10-CM | POA: Insufficient documentation

## 2017-02-28 LAB — BASIC METABOLIC PANEL
Anion gap: 10 (ref 5–15)
BUN: 12 mg/dL (ref 6–20)
CALCIUM: 9.3 mg/dL (ref 8.9–10.3)
CO2: 26 mmol/L (ref 22–32)
CREATININE: 0.59 mg/dL (ref 0.44–1.00)
Chloride: 103 mmol/L (ref 101–111)
GFR calc non Af Amer: >60 mL/min (ref >60)
GLUCOSE: 121 mg/dL — AB (ref 65–99)
Potassium: 3.7 mmol/L (ref 3.5–5.1)
Sodium: 139 mmol/L (ref 135–145)

## 2017-02-28 LAB — CBC WITH DIFFERENTIAL/PLATELET
BASOS PCT: 0 %
Basophils Absolute: 0 10*3/uL (ref 0.0–0.1)
EOS ABS: 0 10*3/uL (ref 0.0–0.7)
EOS PCT: 0 %
HCT: 39.4 % (ref 36.0–46.0)
Hemoglobin: 13.1 g/dL (ref 12.0–15.0)
Lymphocytes Relative: 13 %
Lymphs Abs: 1.9 10*3/uL (ref 0.7–4.0)
MCH: 30.5 pg (ref 26.0–34.0)
MCHC: 33.2 g/dL (ref 30.0–36.0)
MCV: 91.6 fL (ref 78.0–100.0)
MONO ABS: 1 10*3/uL (ref 0.1–1.0)
MONOS PCT: 7 %
Neutro Abs: 11.7 10*3/uL — ABNORMAL HIGH (ref 1.7–7.7)
Neutrophils Relative %: 80 %
Platelets: 316 10*3/uL (ref 150–400)
RBC: 4.3 MIL/uL (ref 3.87–5.11)
RDW: 14.1 % (ref 11.5–15.5)
WBC: 14.7 10*3/uL — ABNORMAL HIGH (ref 4.0–10.5)

## 2017-02-28 LAB — HEPATIC FUNCTION PANEL
ALT: 20 U/L (ref 14–54)
AST: 15 U/L (ref 15–41)
Albumin: 3.9 g/dL (ref 3.5–5.0)
Alkaline Phosphatase: 69 U/L (ref 38–126)
BILIRUBIN DIRECT: 0.1 mg/dL (ref 0.1–0.5)
BILIRUBIN TOTAL: 0.4 mg/dL (ref 0.3–1.2)
Indirect Bilirubin: 0.3 mg/dL (ref 0.3–0.9)
Total Protein: 6.8 g/dL (ref 6.5–8.1)

## 2017-02-28 LAB — D-DIMER, QUANTITATIVE: D-Dimer, Quant: 1.69 ug/mL-FEU — ABNORMAL HIGH (ref 0.00–0.50)

## 2017-02-28 LAB — I-STAT TROPONIN, ED: TROPONIN I, POC: 0.01 ng/mL (ref 0.00–0.08)

## 2017-02-28 MED ORDER — HYDROMORPHONE HCL 1 MG/ML IJ SOLN
1.0000 mg | Freq: Once | INTRAMUSCULAR | Status: AC
  Administered 2017-02-28: 1 mg via INTRAVENOUS
  Filled 2017-02-28: qty 1

## 2017-02-28 MED ORDER — IOPAMIDOL (ISOVUE-370) INJECTION 76%
100.0000 mL | Freq: Once | INTRAVENOUS | Status: AC | PRN
  Administered 2017-02-28: 100 mL via INTRAVENOUS

## 2017-02-28 MED ORDER — HYDROCODONE-ACETAMINOPHEN 5-325 MG PO TABS: 1.0000 | ORAL_TABLET | ORAL | 0 refills | Status: DC | PRN

## 2017-02-28 NOTE — ED Triage Notes (Signed)
Right sided chest pain that radiates around to back.  States she lifted a box Wednesday and twisted and had onset of back pain then.  Seen PCP Friday and was given prednisone and muscle relaxers.
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
## 2017-02-28 NOTE — ED Notes (Signed)
EKG given to Dr. Goldston  

## 2017-03-02 ENCOUNTER — Encounter: Payer: Self-pay | Admitting: Family Medicine

## 2017-03-02 ENCOUNTER — Ambulatory Visit (INDEPENDENT_AMBULATORY_CARE_PROVIDER_SITE_OTHER): Payer: Federal, State, Local not specified - PPO | Admitting: Family Medicine

## 2017-03-02 VITALS — BP 122/84 | Ht 63.0 in | Wt 234.0 lb

## 2017-03-02 DIAGNOSIS — K760 Fatty (change of) liver, not elsewhere classified: Secondary | ICD-10-CM

## 2017-03-02 DIAGNOSIS — S2241XD Multiple fractures of ribs, right side, subsequent encounter for fracture with routine healing: Secondary | ICD-10-CM | POA: Diagnosis not present

## 2017-03-02 DIAGNOSIS — I700 Atherosclerosis of aorta: Secondary | ICD-10-CM | POA: Diagnosis not present

## 2017-03-02 MED ORDER — OXYCODONE-ACETAMINOPHEN 5-325 MG PO TABS: 1.0000 | ORAL_TABLET | ORAL | 0 refills | Status: DC | PRN

## 2017-03-02 NOTE — Progress Notes (Signed)
   Subjective:    Patient ID: [name], female    DOB: 11-03-1965, 51 y.o.   MRN: [id number]  HPI Patient was in ER she had a CAT scan showed some fractures of the ribs did not show pulmonary embolism she has ongoing rib pain with certain movements she states she's hydrocodone did not help. She feels she needs something stronger denies wheezing difficulty breathing sweats chills vomiting diarrhea headaches Patient arrives for a follow up from a recent ER visit for rib fractures.Review of Systems Please see above    Objective:   Physical Exam Neck no abnormal masses lungs clear no crackles respiratory rate is normal breast sounds normal chest wall mild tenderness heart regular no murmurs or gallops pulse normal skin warm dry  Results of CT scan reviewed in detail Patient has been counseled to quit smoking    Assessment & Plan:  Rib fractures-oxycodone for pain not for long-term use caution drowsiness  To stay out of work through October 12 may need to stay out longer 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

## 2017-03-08 ENCOUNTER — Telehealth: Payer: Self-pay | Admitting: Family Medicine

## 2017-03-08 MED ORDER — OXYCODONE-ACETAMINOPHEN 5-325 MG PO TABS: ORAL_TABLET | ORAL | 0 refills | Status: DC

## 2017-03-08 NOTE — Telephone Encounter (Signed)
May have a refill on prescription for 20 tablets 1 every 6 hours when necessary severe pain

## 2017-03-08 NOTE — Telephone Encounter (Signed)
Requesting refill on oxyCODONE-acetaminophen (PERCOCET/ROXICET) 5-325 MG tablet.

## 2017-03-08 NOTE — Telephone Encounter (Signed)
Patient is aware 

## 2017-03-08 NOTE — Telephone Encounter (Signed)
Rx printed awaiting signature.

## 2017-03-11 ENCOUNTER — Encounter: Payer: Self-pay | Admitting: Family Medicine

## 2017-03-11 ENCOUNTER — Ambulatory Visit (INDEPENDENT_AMBULATORY_CARE_PROVIDER_SITE_OTHER): Payer: Federal, State, Local not specified - PPO | Admitting: Family Medicine

## 2017-03-11 ENCOUNTER — Other Ambulatory Visit: Payer: Self-pay | Admitting: Family Medicine

## 2017-03-11 VITALS — BP 138/80 | Ht 63.0 in | Wt 231.0 lb

## 2017-03-11 DIAGNOSIS — S2241XD Multiple fractures of ribs, right side, subsequent encounter for fracture with routine healing: Secondary | ICD-10-CM | POA: Diagnosis not present

## 2017-03-11 MED ORDER — OXYCODONE-ACETAMINOPHEN 5-325 MG PO TABS
ORAL_TABLET | ORAL | 0 refills | Status: DC
Start: 2017-03-11 — End: 2017-03-19

## 2017-03-11 NOTE — Progress Notes (Signed)
   Subjective:    Patient ID: [name], female    DOB: 04-16-1966, 51 y.o.   MRN: [id number]  HPIFollow up on right side rib pain. Tried to go back to work. Was not able to. Needs work note extended. Tried ice packs, motrin, heating pad, oxycodone.    Diarrhea all night last night.  This patient relates that moving side to side rotating causing significant pain discomfort unable to do any type of lifting. Describes severe pain. In addition to this relates unable to work because of this. Some frequent diarrhea during the night she thinks this is due to Chantix she denies nausea vomiting  Review of Systems Please see above. Relates pain discomfort with movement rotation taking a deep breath and coughing    Objective:   Physical Exam Lungs are clear no respiratory distress heart is regular pulse normal extremities no edema some discomfort with rotation extension and bending       Assessment & Plan:  Multiple rib fractures Patient was told to restart meloxicam She may use oxycodone maximum of 4 times per day Additional prescription given that she can get filled on the 13th She is to be out of work with return to work on October 29 If she does not feel she is making progress he needs to let us know so we can extend her work note She may need to follow-up if ongoing troubles Eventually over the next several weeks she needs to wean away from oxycodone she was warned regarding addiction potential

## 2017-03-19 ENCOUNTER — Telehealth: Payer: Self-pay | Admitting: Family Medicine

## 2017-03-19 MED ORDER — OXYCODONE-ACETAMINOPHEN 5-325 MG PO TABS: ORAL_TABLET | ORAL | 0 refills | Status: DC

## 2017-03-19 NOTE — Telephone Encounter (Signed)
Patient called back and said she doesn't have enough to last until Monday and wants to know if Dr. Richardson Landry or Hoyle Sauer can write this for her.

## 2017-03-19 NOTE — Telephone Encounter (Signed)
Thirty more of the pain meds

## 2017-03-19 NOTE — Telephone Encounter (Signed)
Requesting refill for oxycodone 

## 2017-03-19 NOTE — Telephone Encounter (Signed)
Spoke with patient and informed her that prescription was ready for pick up

## 2017-03-19 NOTE — Telephone Encounter (Signed)
Left message return call 03/19/17

## 2017-04-20 ENCOUNTER — Other Ambulatory Visit: Payer: Self-pay | Admitting: Family Medicine

## 2017-06-09 ENCOUNTER — Other Ambulatory Visit: Payer: Self-pay | Admitting: Family Medicine

## 2017-06-09 ENCOUNTER — Ambulatory Visit: Payer: Federal, State, Local not specified - PPO | Admitting: Gastroenterology

## 2017-06-09 ENCOUNTER — Encounter: Payer: Self-pay | Admitting: Gastroenterology

## 2017-06-09 ENCOUNTER — Telehealth: Payer: Self-pay | Admitting: Internal Medicine

## 2017-06-09 NOTE — Telephone Encounter (Signed)
PATIENT WAS A NO SHOW AND LETTER SENT  °

## 2017-07-06 ENCOUNTER — Other Ambulatory Visit: Payer: Self-pay | Admitting: Family Medicine

## 2017-07-18 ENCOUNTER — Other Ambulatory Visit: Payer: Self-pay | Admitting: Family Medicine

## 2017-08-30 ENCOUNTER — Other Ambulatory Visit: Payer: Self-pay | Admitting: Family Medicine

## 2017-09-18 ENCOUNTER — Other Ambulatory Visit: Payer: Self-pay | Admitting: Family Medicine

## 2017-10-13 ENCOUNTER — Other Ambulatory Visit: Payer: Self-pay | Admitting: Family Medicine

## 2017-10-30 ENCOUNTER — Other Ambulatory Visit: Payer: Self-pay | Admitting: Family Medicine

## 2017-11-07 ENCOUNTER — Other Ambulatory Visit: Payer: Self-pay | Admitting: Family Medicine

## 2017-12-07 ENCOUNTER — Other Ambulatory Visit: Payer: Self-pay | Admitting: Family Medicine

## 2017-12-17 IMAGING — CT CT ANGIO CHEST
2 of 6 series · 17 of 46 positions shown · IV contrast (Isovue)
Comparison: Chest radiograph - 06/05/2016 ; chest CT - 05/13/2016

CLINICAL DATA: Right-sided chest pain that radiates to the back
which began after lifting and twisting injury earlier this week.
Elevated D-dimer. Evaluate for pulmonary embolism.

EXAM:
CT ANGIOGRAPHY CHEST WITH CONTRAST
TECHNIQUE: Multidetector CT imaging of the chest was performed using the
standard protocol during bolus administration of intravenous
contrast. Multiplanar CT image reconstructions and MIPs were
obtained to evaluate the vascular anatomy.
CONTRAST:  100 cc Isovue 370

[Series 5: thins · axial · 0.70mm/px · z∈[+861,+1123]mm · 14 of 288 slices shown]
[im 13/288  lung]
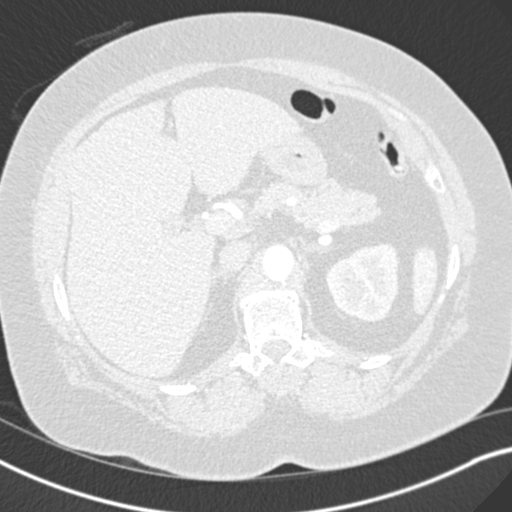
[im 38/288  soft-tissue]
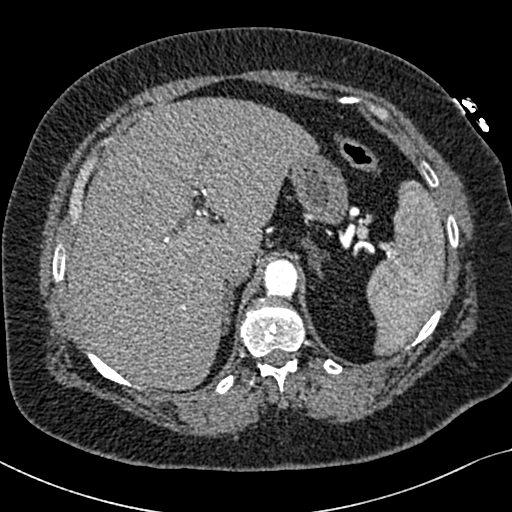
[im 50/288  lung]
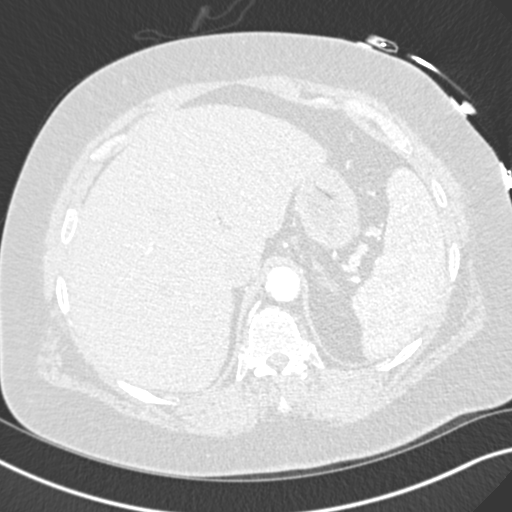
[im 75/288  soft-tissue]
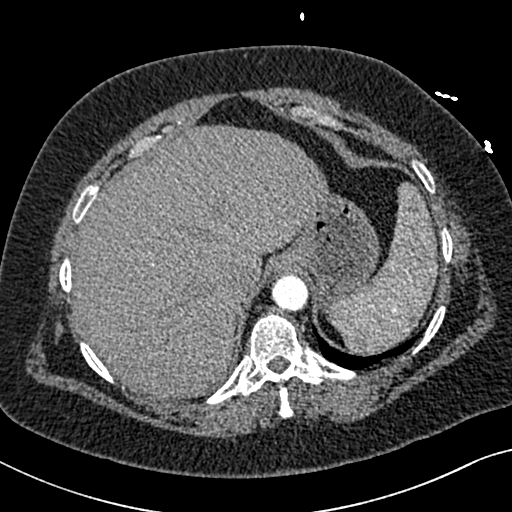
[im 100/288  lung]
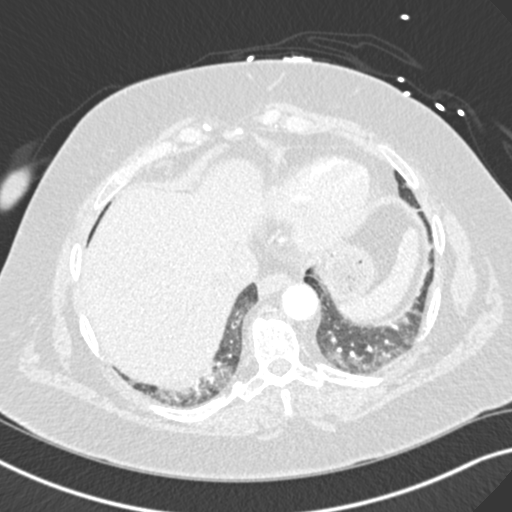
[im 113/288  soft-tissue]
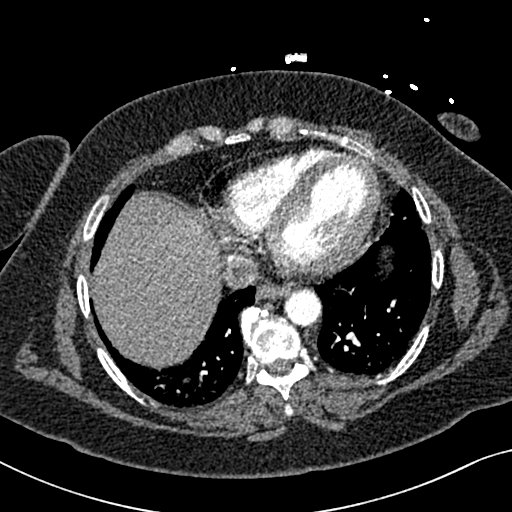
[im 138/288  lung]
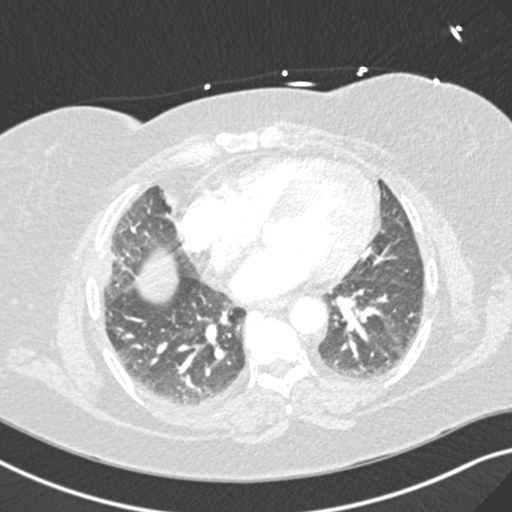
[im 150/288  soft-tissue]
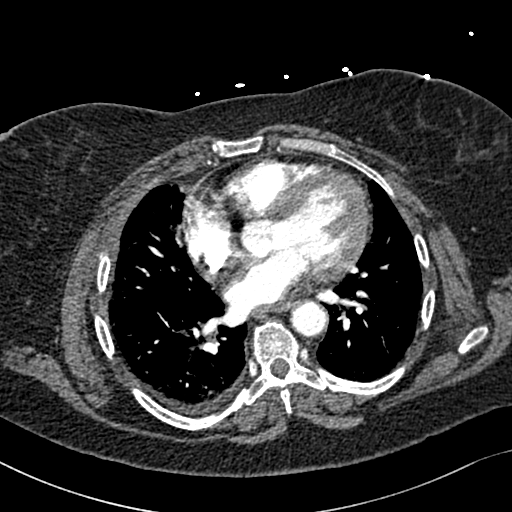
[im 175/288  lung]
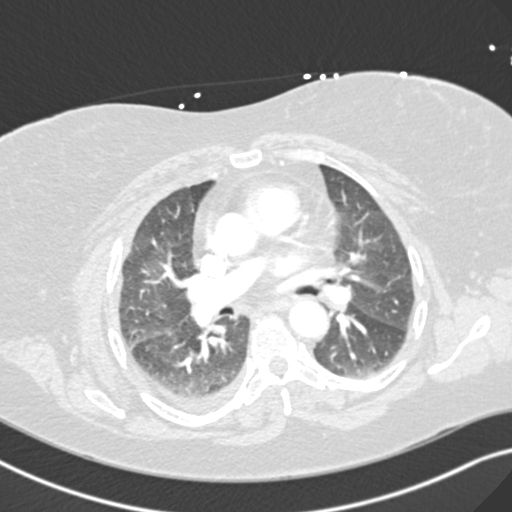
[im 188/288  soft-tissue]
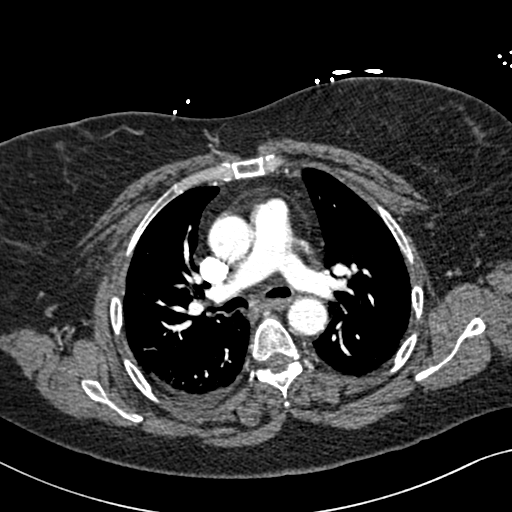
[im 213/288  lung]
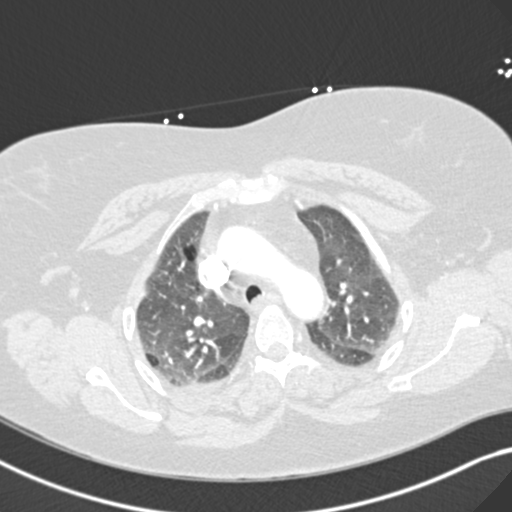
[im 238/288  soft-tissue]
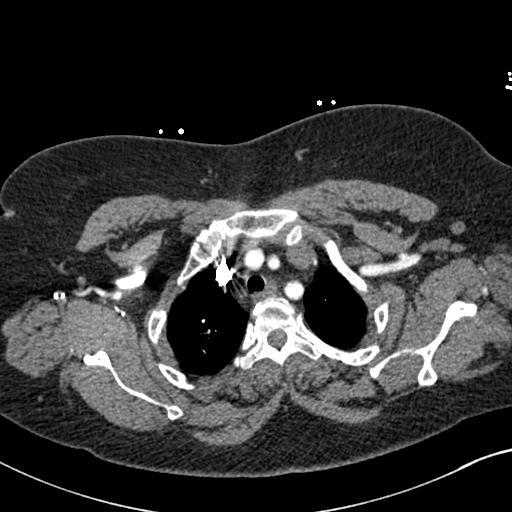
[im 250/288  lung]
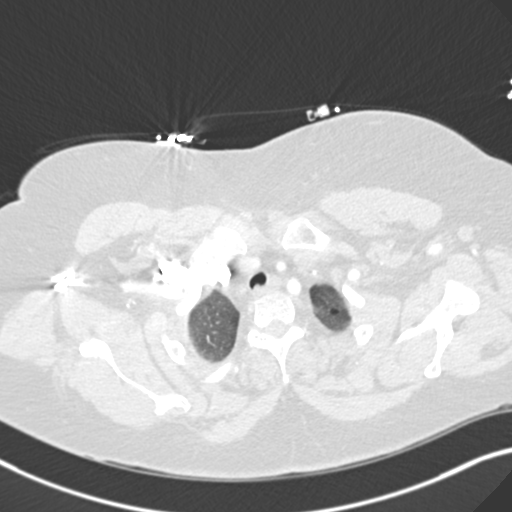
[im 275/288  soft-tissue]
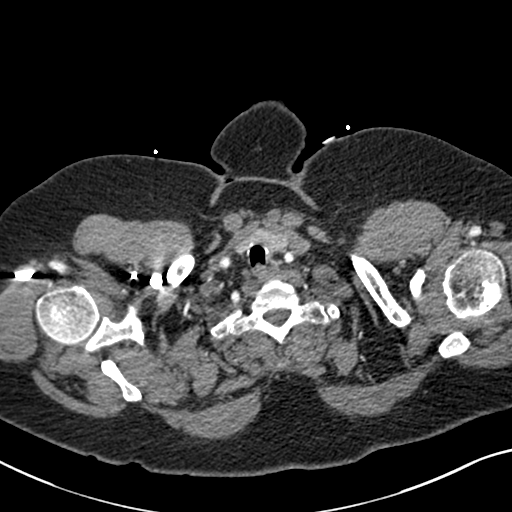

[Series 7: coronal mpr · coronal · 0.64mm/px · 3 of 151 slices shown]
[im 38/151  soft-tissue]
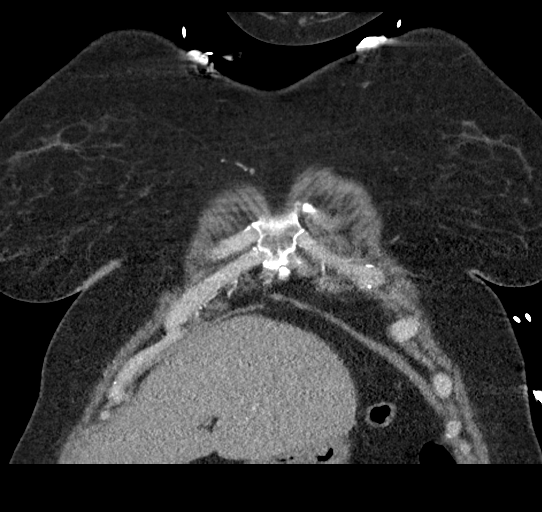
[im 76/151  soft-tissue]
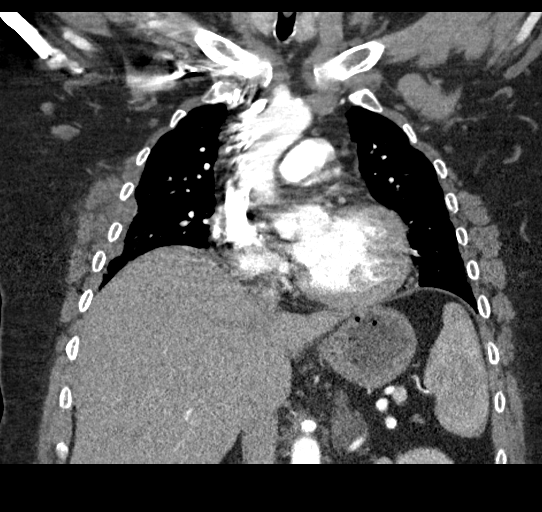
[im 113/151  soft-tissue]
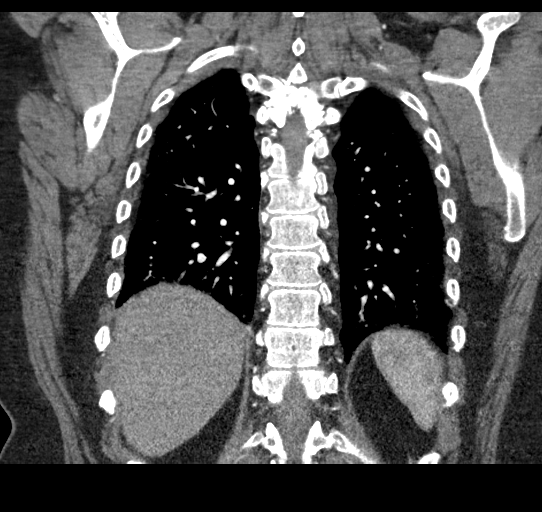

[17 of 46 positions shown; findings below may reference images not displayed]

FINDINGS: Vascular Findings:

There is adequate opacification of the pulmonary arterial system
with the main pulmonary artery measuring 316 Hounsfield units. There
are no discrete filling defects within the pulmonary arterial tree
this suggest pulmonary embolism. Normal caliber of the main
pulmonary artery.

Borderline cardiomegaly.  No pericardial effusion.

Normal caliber of the thoracic aorta. Scattered minimal amount
atherosclerotic plaque within the aortic arch. No definite evidence
of thoracic aortic dissection or periaortic stranding on this
nongated examination. Conventional configuration of the aortic arch.
The branch vessels of the aortic arch appear widely patent
throughout their imaged course.

Review of the MIP images confirms the above findings.

----------------------------------------------------------------------------------

Nonvascular Findings:

Mediastinum/Lymph Nodes: Scattered mediastinal and hilar lymph nodes
are numerous though individually not enlarged by size criteria with
index precarinal mediastinal lymph node measuring 0.7 cm in greatest
short axis diameter (image 20, series 4) and index right suprahilar
lymph node measuring 0.6 cm (image 35, series 4). No bulky
mediastinal, hilar or axillary lymphadenopathy.

Lungs/Pleura: Evaluation of the pulmonary parenchyma is degraded
secondary to patient respiratory artifact.

Trace right-sided pleural effusion. Bibasilar subpleural
ground-glass opacities, favored to represent atelectasis. No
discrete focal airspace opacities. No pneumothorax. There is
perihilar predominant bronchial wall thickening however the central
pulmonary airways remain patent.

No discrete pulmonary nodules given limitation of the examination.

Upper abdomen: Limited early arterial phase evaluation of the upper
abdomen demonstrates diffuse decreased attenuation of the hepatic
parenchyma with associated suspected mild micro nodularity
(representative image 81, series 4) with borderline enlargement of
the spleen, measuring 3.6 cm in diameter (image 77, series 4). There
is mild thickening the bilateral adrenal glands without discrete
nodule.

Musculoskeletal: Acute, minimally displaced fractures involving the
right 4th and 5th ribs with associated minimal amount of adjacent
pleuroparenchymal thickening (images 40 and 48, series 4). Stigmata
of DISH within the thoracic spine.

Regional soft tissues appear normal. Normal appearance of the
thyroid gland.
IMPRESSION: 1. Acute, minimally displaced fractures involving the right 4th and
5th ribs with associated trace right-sided pleural effusion. No
pneumothorax.
2. No evidence of pulmonary embolism.
3. Findings suggestive of hepatic steatosis with potential early
cirrhotic change. Correlation with LFTs is recommended.
4. Aortic Atherosclerosis ([id number]).

## 2017-12-28 ENCOUNTER — Other Ambulatory Visit: Payer: Self-pay | Admitting: Family Medicine

## 2017-12-28 NOTE — Telephone Encounter (Signed)
3 refills needs OV

## 2018-01-01 ENCOUNTER — Other Ambulatory Visit: Payer: Self-pay | Admitting: Family Medicine

## 2018-01-03 ENCOUNTER — Other Ambulatory Visit: Payer: Self-pay | Admitting: Family Medicine

## 2018-01-27 ENCOUNTER — Other Ambulatory Visit: Payer: Self-pay | Admitting: Family Medicine

## 2018-01-28 ENCOUNTER — Other Ambulatory Visit: Payer: Self-pay | Admitting: Family Medicine

## 2018-03-16 ENCOUNTER — Other Ambulatory Visit: Payer: Self-pay | Admitting: Family Medicine

## 2018-04-17 ENCOUNTER — Other Ambulatory Visit: Payer: Self-pay | Admitting: Family Medicine

## 2018-04-18 NOTE — Telephone Encounter (Signed)
May have this refill will need follow-up office visit

## 2018-04-27 ENCOUNTER — Other Ambulatory Visit: Payer: Self-pay | Admitting: Family Medicine

## 2018-05-16 ENCOUNTER — Other Ambulatory Visit: Payer: Self-pay | Admitting: Family Medicine

## 2018-07-09 ENCOUNTER — Other Ambulatory Visit: Payer: Self-pay | Admitting: Family Medicine

## 2018-08-06 ENCOUNTER — Other Ambulatory Visit: Payer: Self-pay | Admitting: Family Medicine

## 2018-09-02 ENCOUNTER — Other Ambulatory Visit: Payer: Self-pay | Admitting: Family Medicine

## 2018-09-04 NOTE — Telephone Encounter (Signed)
May have this prescription-needs a virtual visit

## 2018-09-06 ENCOUNTER — Other Ambulatory Visit: Payer: Self-pay | Admitting: Family Medicine

## 2018-09-06 NOTE — Telephone Encounter (Signed)
1 refill recommend virtual office visit if any issues otherwise recommend OV in summer

## 2018-09-19 ENCOUNTER — Other Ambulatory Visit: Payer: Self-pay

## 2018-09-19 ENCOUNTER — Ambulatory Visit (INDEPENDENT_AMBULATORY_CARE_PROVIDER_SITE_OTHER): Payer: Federal, State, Local not specified - PPO | Admitting: Family Medicine

## 2018-09-19 DIAGNOSIS — F410 Panic disorder [episodic paroxysmal anxiety] without agoraphobia: Secondary | ICD-10-CM | POA: Diagnosis not present

## 2018-09-19 DIAGNOSIS — J439 Emphysema, unspecified: Secondary | ICD-10-CM

## 2018-09-19 DIAGNOSIS — F411 Generalized anxiety disorder: Secondary | ICD-10-CM

## 2018-09-19 MED ORDER — CLONAZEPAM 0.5 MG PO TABS: 0.5000 mg | ORAL_TABLET | Freq: Two times a day (BID) | ORAL | 1 refills | Status: DC | PRN

## 2018-09-19 MED ORDER — ALBUTEROL SULFATE HFA 108 (90 BASE) MCG/ACT IN AERS: INHALATION_SPRAY | RESPIRATORY_TRACT | 6 refills | Status: DC

## 2018-09-19 MED ORDER — TIOTROPIUM BROMIDE MONOHYDRATE 18 MCG IN CAPS: ORAL_CAPSULE | RESPIRATORY_TRACT | 5 refills | Status: DC

## 2018-09-19 MED ORDER — FLUOXETINE HCL 20 MG PO CAPS: ORAL_CAPSULE | ORAL | 5 refills | Status: DC

## 2018-09-19 NOTE — Progress Notes (Signed)
   Subjective:  Video and audio Patient at work I was at work Patient consented  Patient ID: [name], female    DOB: 1965/08/06, 53 y.o.   MRN: [id number]  HPIAnxiety. Having a lot of stress at work. Two coworkers have had symptoms of covid 46 and pt is having anxiety because she is at high risk with her copd.  Severe amount of anxiety has COPD.  Needs refills on her medicine.  Denies any breathing issues recently.  Under a lot of stress anxiousness regarding coronavirus there has been other members of her workforce who have been diagnosed with it.  Patient denies being depressed just more so anxious and at times panic attacks but not severe Virtual Visit via Video Note  I connected with [name] on 09/19/18 at 10:00 AM EDT by a video enabled telemedicine application and verified that I am speaking with the correct person using two identifiers.   I discussed the limitations of evaluation and management by telemedicine and the availability of in person appointments. The patient expressed understanding and agreed to proceed.  History of Present Illness:    Observations/Objective:   Assessment and Plan:   Follow Up Instructions:    I discussed the assessment and treatment plan with the patient. The patient was provided an opportunity to ask questions and all were answered. The patient agreed with the plan and demonstrated an understanding of the instructions.   The patient was advised to call back or seek an in-person evaluation if the symptoms worsen or if the condition fails to improve as anticipated.  I provided 15 minutes of non-face-to-face time during this encounter.      Review of Systems     Objective:   Physical Exam        Assessment & Plan:  Anxiety along with panic attacks COPD stable Refills given Prozac daily to minimize the amount of stress and anxiousness Klonopin when necessary to help with nerves Only use this when at home  Patient will do a follow-up in 2 to 3 weeks follow-up sooner if any problems if she needs FMLA to come out of work she will let us know

## 2019-03-03 ENCOUNTER — Ambulatory Visit (INDEPENDENT_AMBULATORY_CARE_PROVIDER_SITE_OTHER): Payer: Federal, State, Local not specified - PPO | Admitting: Nurse Practitioner

## 2019-03-03 ENCOUNTER — Encounter: Payer: Self-pay | Admitting: Nurse Practitioner

## 2019-03-03 ENCOUNTER — Other Ambulatory Visit: Payer: Self-pay

## 2019-03-03 DIAGNOSIS — F411 Generalized anxiety disorder: Secondary | ICD-10-CM | POA: Diagnosis not present

## 2019-03-03 DIAGNOSIS — F430 Acute stress reaction: Secondary | ICD-10-CM

## 2019-03-03 MED ORDER — CLONAZEPAM 0.5 MG PO TABS: ORAL_TABLET | ORAL | 0 refills | Status: DC

## 2019-03-03 MED ORDER — FLUOXETINE HCL 40 MG PO CAPS: 40.0000 mg | ORAL_CAPSULE | Freq: Every day | ORAL | 2 refills | Status: DC

## 2019-03-03 NOTE — Progress Notes (Addendum)
PHONE VISIT Subjective:    Patient ID: [name], female    DOB: 20-Mar-1966, 53 y.o.   MRN: [id number]  Anxiety Presents for follow-up visit. Symptoms include nervous/anxious behavior. Primary symptoms comment: Since the pandemic, pt anxiety is non stop; unable to take a day off; pt is stressed out; pt is also having family stress; pt is getting angry easy; chest tightness, rapid heart rate. Symptoms occur constantly. The quality of sleep is poor.    pt states her arms and hands are going numb also.  Virtual Visit via Video Note  I connected with [name] on 03/03/19 at  9:00 AM EDT by a video enabled telemedicine application and verified that I am speaking with the correct person using two identifiers.  Location: Patient: home Provider: office   I discussed the limitations of evaluation and management by telemedicine and the availability of in person appointments. The patient expressed understanding and agreed to proceed.  History of Present Illness: Presents for complaints of excessive stress that is been going on for several weeks.  Patient works for the [employer].  No longer has any days off.  Working 12 to 14 hours/day.  Has not had a vacation since last year.  Describes feeling angry and stressed.  Lack of focus.  Difficulty sleeping even though she is exhausted.  Denies suicidal or homicidal thoughts or ideation.  Mainly has difficulty with relationships at home.  Doing fine at work. GAD 7 : Generalized Anxiety Score 03/03/2019  Nervous, Anxious, on Edge 3  Control/stop worrying 3  Worry too much - different things 3  Trouble relaxing 3  Restless 3  Easily annoyed or irritable 3  Afraid - awful might happen 3  Total GAD 7 Score 21  Anxiety Difficulty Somewhat difficult      Observations/Objective: Today's visit was via telephone Physical exam was not possible for this visit Alert, oriented.  Calm affect.  Thoughts logical coherent and relevant.   Assessment and Plan: Anxiety as acute reaction to exceptional stress  Meds ordered this encounter  Medications  . FLUoxetine (PROZAC) 40 MG capsule    Sig: Take 1 capsule (40 mg total) by mouth daily.    Dispense:  30 capsule    Refill:  2    Order Specific Question:   Supervising Provider    Answer:   Sallee Lange A [[id number]]  . clonazePAM (KLONOPIN) 0.5 MG tablet    Sig: Take 1/2 tab po BID during the day for extreme anxiety then one po qhs    Dispense:  45 tablet    Refill:  0    Order Specific Question:   Supervising Provider    Answer:   Sallee Lange A [[id number]]     Follow Up Instructions: Increase Prozac to 40 mg daily.  Reviewed potential adverse effects.  Go back to 20 mg dose and contact office if any problems.  Restart clonazepam as directed 1 at night for sleep and occasional half tab during the day only for extreme anxiety.  Drowsiness precautions.  Offered patient FMLA but she defers at this time.  She expects the stress level to stay constant until after Christmas. Return in about 1 month (around 04/03/2019). Call back sooner if needed.   I discussed the assessment and treatment plan with the patient. The patient was provided an opportunity to ask questions and all were answered. The patient agreed with the plan and demonstrated an understanding of the instructions.   The patient was  advised to call back or seek an in-person evaluation if the symptoms worsen or if the condition fails to improve as anticipated.  I provided 15 minutes of non-face-to-face time during this encounter.       Review of Systems  Psychiatric/Behavioral: The patient is nervous/anxious.        Objective:   Physical Exam        Assessment & Plan:

## 2019-03-20 ENCOUNTER — Telehealth: Payer: Self-pay | Admitting: Family Medicine

## 2019-03-20 NOTE — Telephone Encounter (Signed)
Message for [name]--patient was seen 10/2 for anxiety and was out of work today and wanting a work note to be out of work She states you and her discussed this at her visit.. I tired to explained if more than three days she would have to go through her HR and get FMLA papers. Please advise

## 2019-03-22 NOTE — Telephone Encounter (Signed)
Please provide work note for days that she needs. Let us know if she needs FMLA. Thanks.

## 2019-03-23 ENCOUNTER — Encounter: Payer: Self-pay | Admitting: Nurse Practitioner

## 2019-03-29 ENCOUNTER — Other Ambulatory Visit: Payer: Self-pay | Admitting: Family Medicine

## 2019-04-07 ENCOUNTER — Encounter: Payer: Self-pay | Admitting: Nurse Practitioner

## 2019-04-07 ENCOUNTER — Ambulatory Visit (INDEPENDENT_AMBULATORY_CARE_PROVIDER_SITE_OTHER): Payer: Federal, State, Local not specified - PPO | Admitting: Nurse Practitioner

## 2019-04-07 DIAGNOSIS — F430 Acute stress reaction: Secondary | ICD-10-CM

## 2019-04-07 DIAGNOSIS — F411 Generalized anxiety disorder: Secondary | ICD-10-CM

## 2019-04-07 DIAGNOSIS — F5102 Adjustment insomnia: Secondary | ICD-10-CM | POA: Diagnosis not present

## 2019-04-07 MED ORDER — TRAZODONE HCL 50 MG PO TABS: 25.0000 mg | ORAL_TABLET | Freq: Every evening | ORAL | 1 refills | Status: DC | PRN

## 2019-04-07 MED ORDER — CLONAZEPAM 1 MG PO TABS: ORAL_TABLET | ORAL | 1 refills | Status: DC

## 2019-04-07 NOTE — Progress Notes (Signed)
  PHONE VISIT Subjective:    Patient ID: [name], female    DOB: 1966/01/25, 53 y.o.   MRN: [id number]  HPI one month follow up on anxiety. prozac was increased from 20mg  to 40 mg last month. Pt states she feels better but still under a lot of stress at work.   Virtual Visit via Telephone Note  I connected with [name] on 04/07/19 at  9:00 AM EST by telephone and verified that I am speaking with the correct person using two identifiers.  Location: Patient: home Provider: office   I discussed the limitations, risks, security and privacy concerns of performing an evaluation and management service by telephone and the availability of in person appointments. I also discussed with the patient that there may be a patient responsible charge related to this service. The patient expressed understanding and agreed to proceed.   History of Present Illness: Presents for recheck of her anxiety and stress. Has seen minimal improvement on Prozac 40 mg but no side effects. 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 Defers FMLA at this time. Stress is expected to continue until after the holidays. Klonopin not working as well during the day at current dose. Difficulty sleeping the past 2 nights.    Observations/Objective; Today's visit was via telephone Physical exam was not possible for this visit Alert, oriented. Calm affect. Thoughts logical, coherent and relevant.   Assessment and Plan: Problem List Items Addressed This Visit      Other   Anxiety as acute reaction to exceptional stress - Primary   Relevant Medications   traZODone (DESYREL) 50 MG tablet    Other Visit Diagnoses    Insomnia due to stress         Meds ordered this encounter  Medications  . traZODone (DESYREL) 50 MG tablet    Sig: Take 0.5-1 tablets (25-50 mg total) by mouth at bedtime as needed for sleep.    Dispense:  30 tablet    Refill:  1    Order  Specific Question:   Supervising Provider    Answer:   Sallee Lange A [[id number]]  . clonazePAM (KLONOPIN) 1 MG tablet    Sig: Take 1/2 tab po BID during the day prn anxiety; may take 1/2 tab qhs prn sleep    Dispense:  45 tablet    Refill:  1    Order Specific Question:   Supervising Provider    Answer:   Sallee Lange A [[id number]]     Follow Up Instructions: Increase Klonopin dose for now with a goal of decreasing after the holidays. Start Trazodone as directed for sleep. DC med and call if any adverse effects. Recommend flu vaccine. Recommend preventive health physical with labs after the holidays. Otherwise, follow up in 3 months, contact office sooner if needed.    I discussed the assessment and treatment plan with the patient. The patient was provided an opportunity to ask questions and all were answered. The patient agreed with the plan and demonstrated an understanding of the instructions.   The patient was advised to call back or seek an in-person evaluation if the symptoms worsen or if the condition fails to improve as anticipated.  I provided 15 minutes of non-face-to-face time during this encounter.      Review of Systems     Objective:   Physical Exam        Assessment & Plan:

## 2019-04-17 ENCOUNTER — Telehealth: Payer: Self-pay | Admitting: Family Medicine

## 2019-04-17 NOTE — Telephone Encounter (Signed)
Patient works in [employer], quantity office setting and no one was wearing a mask and some one came in and now has Covid. Patient is wondering if she needs to be tested she has COPD and her nose has been running but now other symptoms this happen on Friday. Please advise

## 2019-04-17 NOTE — Telephone Encounter (Signed)
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 They were notified that someone tested positive for Covid. Patient has a little runny nose but no other symptoms- should she get tested

## 2019-04-17 NOTE — Telephone Encounter (Signed)
Patient advised per Dr Nicki Reaper:    She should definitely get tested. She should also consider staying out from until her test comes back Testing facility open Tuesday through Friday If any respiratory shortness of breath issues she should do a virtual visit or ER depending on the severity Hopefully she is negative She should strongly consider wearing a mask to lessen her risk of picking up sickness especially given her underlying issues Either that or she needs this try to be in a work environment that she is not around others     Patient verbalized understanding.

## 2019-04-17 NOTE — Telephone Encounter (Signed)
She should definitely get tested. She should also consider staying out from until her test comes back Testing facility open Tuesday through Friday If any respiratory shortness of breath issues she should do a virtual visit or ER depending on the severity Hopefully she is negative She should strongly consider wearing a mask to lessen her risk of picking up sickness especially given her underlying issues Either that or she needs this try to be in a work environment that she is not around others

## 2019-04-18 ENCOUNTER — Encounter: Payer: Self-pay | Admitting: Family Medicine

## 2019-04-18 ENCOUNTER — Other Ambulatory Visit: Payer: Self-pay

## 2019-04-18 DIAGNOSIS — Z20822 Contact with and (suspected) exposure to covid-19: Secondary | ICD-10-CM

## 2019-04-19 ENCOUNTER — Encounter: Payer: Self-pay | Admitting: Family Medicine

## 2019-04-19 LAB — NOVEL CORONAVIRUS, NAA: SARS-CoV-2, NAA: NOT DETECTED

## 2019-04-19 NOTE — Telephone Encounter (Signed)
  Nurses   Patient may have work excuse from Monday of this week through Sunday Please also track her results to check coming Friday if the result is back so that we can notify patient as well as be aware of the result

## 2019-04-27 ENCOUNTER — Other Ambulatory Visit: Payer: Self-pay | Admitting: Family Medicine

## 2019-05-29 ENCOUNTER — Other Ambulatory Visit: Payer: Self-pay | Admitting: Family Medicine

## 2019-05-29 ENCOUNTER — Other Ambulatory Visit: Payer: Self-pay | Admitting: Nurse Practitioner

## 2019-06-27 ENCOUNTER — Telehealth: Payer: Self-pay | Admitting: Family Medicine

## 2019-06-27 MED ORDER — SPIRIVA HANDIHALER 18 MCG IN CAPS: ORAL_CAPSULE | RESPIRATORY_TRACT | 5 refills | Status: DC

## 2019-06-27 NOTE — Addendum Note (Signed)
Addended by: Vicente Males on: 06/27/2019 04:03 PM   Modules accepted: Orders

## 2019-06-27 NOTE — Telephone Encounter (Signed)
6 refills °

## 2019-06-27 NOTE — Telephone Encounter (Signed)
Refills sent to pharmacy. 

## 2019-06-27 NOTE — Telephone Encounter (Signed)
Fax from pharmacy requesting refill on Spiriva 18 mcg capsules and handihaler. Inhale the contents of one capsule via inhalation device every day. Pt last seen 04/07/2019 for anxiety. Please advise. Thank you

## 2019-08-26 ENCOUNTER — Other Ambulatory Visit: Payer: Self-pay | Admitting: Nurse Practitioner

## 2019-08-28 NOTE — Telephone Encounter (Signed)
Last med check up 04/07/19

## 2019-08-30 ENCOUNTER — Other Ambulatory Visit: Payer: Self-pay | Admitting: *Deleted

## 2019-08-30 MED ORDER — FLUOXETINE HCL 40 MG PO CAPS: ORAL_CAPSULE | ORAL | 0 refills | Status: DC

## 2019-09-11 ENCOUNTER — Ambulatory Visit (INDEPENDENT_AMBULATORY_CARE_PROVIDER_SITE_OTHER): Payer: Federal, State, Local not specified - PPO | Admitting: Adult Health

## 2019-09-11 ENCOUNTER — Other Ambulatory Visit: Payer: Self-pay

## 2019-09-11 ENCOUNTER — Encounter: Payer: Self-pay | Admitting: Adult Health

## 2019-09-11 VITALS — BP 150/98 | HR 81 | Ht 63.0 in | Wt 224.0 lb

## 2019-09-11 DIAGNOSIS — R030 Elevated blood-pressure reading, without diagnosis of hypertension: Secondary | ICD-10-CM | POA: Diagnosis not present

## 2019-09-11 DIAGNOSIS — N898 Other specified noninflammatory disorders of vagina: Secondary | ICD-10-CM

## 2019-09-11 DIAGNOSIS — Z202 Contact with and (suspected) exposure to infections with a predominantly sexual mode of transmission: Secondary | ICD-10-CM

## 2019-09-11 DIAGNOSIS — Z113 Encounter for screening for infections with a predominantly sexual mode of transmission: Secondary | ICD-10-CM | POA: Diagnosis not present

## 2019-09-11 HISTORY — DX: Encounter for screening for infections with a predominantly sexual mode of transmission: Z11.3

## 2019-09-11 HISTORY — DX: Other specified noninflammatory disorders of vagina: N89.8

## 2019-09-11 NOTE — Progress Notes (Signed)
Patient ID: [name], female   DOB: March 09, 1966, 54 y.o.   MRN: [id number] History of Present Illness:  [name] is a 54 year old white female,divorced, PM in requesting STD testing had vaginal discharge and itch about 2 weeks after sex with ex boyfriend. 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 PCP is Dr Sallee Lange.   Current Medications, Allergies, Past Medical History, Past Surgical History, Family History and Social History were reviewed in [employer] record.     Review of Systems: Had vaginal discharge about 2 weeks after sex with ex boyfriend +vaginal itching too    Physical Exam:BP (!) 150/98 (BP Location: Left Arm, Cuff Size: Large)   Pulse 81   Ht 5\' 3"  (1.6 m)   Wt 224 lb (101.6 kg)   LMP 03/18/2013 (Approximate)   BMI 39.68 kg/m  BP was 197/107 on arrival she says she has this happen a lot, and was given BP meds and BP got too low. General:  Well developed, well nourished, no acute distress Skin:  Warm and dry,+tattoos  Lungs; Clear to auscultation bilaterally Cardiovascular: Regular rate and rhythm Abdomen:  Soft, non tender, no hepatosplenomegaly Pelvic:  External genitalia is normal in appearance, no lesions.  The vagina has scant white discharge, no odor.Urethra has no lesions or masses. The cervix is bulbous.  Uterus is felt to be normal size, shape, and contour.  No adnexal masses or tenderness noted.Bladder is non tender, no masses felt. Psych:  No mood changes, alert and cooperative,seems happy Nuswab obtained Examination chaperoned by Rolena Infante LPN Fall risk is low AA is 3 PHQ 9 score is 18, denies any SI, stopped prozac it made her mean, takes trazodone.   Impression and Plan: 1. Vaginal discharge Nuswab sent   2. Screening examination for STD (sexually transmitted disease) Nuswab sent She declines HIV and RPR  3. Elevated BP without diagnosis of hypertension Follow up with Dr Sallee Lange about BP and depression     Return about 4/30 for pap and physical, she does not remember last pap, and has never had mammogram or colonoscopy

## 2019-09-11 NOTE — Addendum Note (Signed)
Addended by: Derrek Monaco A on: 09/11/2019 02:38 PM   Modules accepted: Orders

## 2019-09-14 LAB — NUSWAB VAGINITIS PLUS (VG+)
Candida albicans, NAA: NEGATIVE
Candida glabrata, NAA: NEGATIVE
Chlamydia trachomatis, NAA: NEGATIVE
Neisseria gonorrhoeae, NAA: NEGATIVE
Trich vag by NAA: NEGATIVE

## 2019-09-29 ENCOUNTER — Other Ambulatory Visit: Payer: Self-pay

## 2019-09-29 ENCOUNTER — Other Ambulatory Visit (HOSPITAL_COMMUNITY)
Admission: RE | Admit: 2019-09-29 | Discharge: 2019-09-29 | Disposition: A | Discharge status: Home / Self Care | Payer: Federal, State, Local not specified - PPO | Source: Ambulatory Visit | Attending: Adult Health | Admitting: Adult Health

## 2019-09-29 ENCOUNTER — Encounter: Payer: Self-pay | Admitting: Adult Health

## 2019-09-29 ENCOUNTER — Ambulatory Visit (INDEPENDENT_AMBULATORY_CARE_PROVIDER_SITE_OTHER): Payer: Federal, State, Local not specified - PPO | Admitting: Adult Health

## 2019-09-29 VITALS — BP 181/97 | HR 79 | Ht 63.0 in | Wt 219.0 lb

## 2019-09-29 DIAGNOSIS — Z1211 Encounter for screening for malignant neoplasm of colon: Secondary | ICD-10-CM | POA: Diagnosis not present

## 2019-09-29 DIAGNOSIS — Z1212 Encounter for screening for malignant neoplasm of rectum: Secondary | ICD-10-CM

## 2019-09-29 DIAGNOSIS — Z01419 Encounter for gynecological examination (general) (routine) without abnormal findings: Secondary | ICD-10-CM | POA: Insufficient documentation

## 2019-09-29 HISTORY — DX: Encounter for gynecological examination (general) (routine) without abnormal findings: Z01.419

## 2019-09-29 HISTORY — DX: Encounter for screening for malignant neoplasm of colon: Z12.11

## 2019-09-29 LAB — HEMOCCULT GUIAC POC 1CARD (OFFICE): Fecal Occult Blood, POC: NEGATIVE

## 2019-09-29 NOTE — Progress Notes (Signed)
Patient ID: BELISSA TANNOUS, female   DOB: 02-16-1966, 54 y.o.   MRN: [id number] History of Present Illness:  Marieme is a 54 year old white female,divorced, PM in for well woman gyn excam and pap. PCP is Dr Sallee Lange   Current Medications, Allergies, Past Medical History, Past Surgical History, Family History and Social History were reviewed in [place] record.     Review of Systems:  Patient denies any headaches, hearing loss, fatigue, blurred vision, shortness of breath, chest pain, abdominal pain, problems with bowel movements, urination, or intercourse. No joint pain or mood swings.   Physical Exam:BP (!) 181/97 (BP Location: Right Arm, Patient Position: Sitting, Cuff Size: Normal)   Pulse 79   Ht 5\' 3"  (1.6 m)   Wt 219 lb (99.3 kg)   LMP 03/18/2013 (Approximate)   BMI 38.79 kg/m  General:  Well developed, well nourished, no acute distress Skin:  Warm and dry,has numerous tattoos  Neck:  Midline trachea, normal thyroid, good ROM, no lymphadenopathy Lungs; Clear to auscultation bilaterally Breast:  No dominant palpable mass, retraction, or nipple discharge Cardiovascular: Regular rate and rhythm Abdomen:  Soft, non tender, no hepatosplenomegaly Pelvic:  External genitalia is normal in appearance, no lesions.  The vagina is normal in appearance. Urethra has no lesions or masses. The cervix is bulbous. Pap with high risk HPV 16/18 genotyping performed. Uterus is felt to be normal size, shape, and contour.  No adnexal masses or tenderness noted.Bladder is non tender, no masses felt. Rectal: Good sphincter tone, no polyps, or hemorrhoids felt.  Hemoccult negative. Extremities/musculoskeletal:  No swelling or varicosities noted, no clubbing or cyanosis Psych:  No mood changes, alert and cooperative,seems happy AA is 3 Fall risk is low PHQ 9 score is 16, no SI, is on  Trazodone. Examination chaperoned by Celene Squibb LPN  Impression and Plan; 1.  Encounter for gynecological examination with Papanicolaou smear of cervix Pap sent Physical in 1 year Pap in 3 if normal She declines labs Get mammogram now and yearly  Follow up with Dr Wolfgang Phoenix about BP and depression   2. Screening for colorectal cancer She declines referral for colonoscopy

## 2019-10-03 LAB — CYTOLOGY - PAP
Comment: NEGATIVE
Diagnosis: NEGATIVE
High risk HPV: NEGATIVE

## 2019-10-10 ENCOUNTER — Other Ambulatory Visit: Payer: Self-pay | Admitting: Family Medicine

## 2019-10-11 NOTE — Telephone Encounter (Signed)
Seen for copd 09/19/18

## 2019-12-20 ENCOUNTER — Other Ambulatory Visit: Payer: Self-pay | Admitting: Family Medicine

## 2019-12-20 NOTE — Telephone Encounter (Signed)
Lipid, liver, metabolic 7, CBC Anemia other, hypertension, hyperlipidemia May also have 2 refills

## 2019-12-20 NOTE — Telephone Encounter (Signed)
Pt schedule appt for 8/19

## 2019-12-21 ENCOUNTER — Other Ambulatory Visit: Payer: Self-pay | Admitting: *Deleted

## 2019-12-21 DIAGNOSIS — I10 Essential (primary) hypertension: Secondary | ICD-10-CM

## 2019-12-21 DIAGNOSIS — D649 Anemia, unspecified: Secondary | ICD-10-CM

## 2019-12-21 DIAGNOSIS — E1169 Type 2 diabetes mellitus with other specified complication: Secondary | ICD-10-CM

## 2019-12-21 DIAGNOSIS — Z79899 Other long term (current) drug therapy: Secondary | ICD-10-CM

## 2019-12-21 DIAGNOSIS — E785 Hyperlipidemia, unspecified: Secondary | ICD-10-CM

## 2019-12-21 DIAGNOSIS — D619 Aplastic anemia, unspecified: Secondary | ICD-10-CM

## 2019-12-27 ENCOUNTER — Emergency Department (HOSPITAL_COMMUNITY): Payer: Federal, State, Local not specified - PPO

## 2019-12-27 ENCOUNTER — Emergency Department (HOSPITAL_COMMUNITY)
Admission: EM | Admit: 2019-12-27 | Discharge: 2019-12-27 | Disposition: A | Discharge status: Home / Self Care | Payer: Federal, State, Local not specified - PPO | Attending: Emergency Medicine | Admitting: Emergency Medicine

## 2019-12-27 ENCOUNTER — Encounter (HOSPITAL_COMMUNITY): Payer: Self-pay

## 2019-12-27 ENCOUNTER — Telehealth: Payer: Self-pay | Admitting: *Deleted

## 2019-12-27 ENCOUNTER — Other Ambulatory Visit: Payer: Self-pay

## 2019-12-27 DIAGNOSIS — Z7951 Long term (current) use of inhaled steroids: Secondary | ICD-10-CM | POA: Diagnosis not present

## 2019-12-27 DIAGNOSIS — F17210 Nicotine dependence, cigarettes, uncomplicated: Secondary | ICD-10-CM | POA: Diagnosis not present

## 2019-12-27 DIAGNOSIS — I509 Heart failure, unspecified: Secondary | ICD-10-CM | POA: Diagnosis not present

## 2019-12-27 DIAGNOSIS — Z20822 Contact with and (suspected) exposure to covid-19: Secondary | ICD-10-CM | POA: Diagnosis not present

## 2019-12-27 DIAGNOSIS — R05 Cough: Secondary | ICD-10-CM | POA: Diagnosis not present

## 2019-12-27 DIAGNOSIS — R079 Chest pain, unspecified: Secondary | ICD-10-CM | POA: Insufficient documentation

## 2019-12-27 DIAGNOSIS — R0602 Shortness of breath: Secondary | ICD-10-CM | POA: Insufficient documentation

## 2019-12-27 DIAGNOSIS — Z86010 Personal history of colonic polyps: Secondary | ICD-10-CM | POA: Diagnosis not present

## 2019-12-27 DIAGNOSIS — R0789 Other chest pain: Secondary | ICD-10-CM | POA: Diagnosis not present

## 2019-12-27 DIAGNOSIS — J441 Chronic obstructive pulmonary disease with (acute) exacerbation: Secondary | ICD-10-CM

## 2019-12-27 DIAGNOSIS — I110 Hypertensive heart disease with heart failure: Secondary | ICD-10-CM | POA: Insufficient documentation

## 2019-12-27 LAB — CBC WITH DIFFERENTIAL/PLATELET
Abs Immature Granulocytes: 0.02 10*3/uL (ref 0.00–0.07)
Basophils Absolute: 0 10*3/uL (ref 0.0–0.1)
Basophils Relative: 1 %
Eosinophils Absolute: 0.3 10*3/uL (ref 0.0–0.5)
Eosinophils Relative: 3 %
HCT: 40.9 % (ref 36.0–46.0)
Hemoglobin: 13.5 g/dL (ref 12.0–15.0)
Immature Granulocytes: 0 %
Lymphocytes Relative: 28 %
Lymphs Abs: 2.1 10*3/uL (ref 0.7–4.0)
MCH: 30.5 pg (ref 26.0–34.0)
MCHC: 33 g/dL (ref 30.0–36.0)
MCV: 92.5 fL (ref 80.0–100.0)
Monocytes Absolute: 0.6 10*3/uL (ref 0.1–1.0)
Monocytes Relative: 8 %
Neutro Abs: 4.5 10*3/uL (ref 1.7–7.7)
Neutrophils Relative %: 60 %
Platelets: 301 10*3/uL (ref 150–400)
RBC: 4.42 MIL/uL (ref 3.87–5.11)
RDW: 13.2 % (ref 11.5–15.5)
WBC: 7.5 10*3/uL (ref 4.0–10.5)
nRBC: 0 % (ref 0.0–0.2)

## 2019-12-27 LAB — BASIC METABOLIC PANEL
Anion gap: 9 (ref 5–15)
BUN: 10 mg/dL (ref 6–20)
CO2: 26 mmol/L (ref 22–32)
Calcium: 9.3 mg/dL (ref 8.9–10.3)
Chloride: 105 mmol/L (ref 98–111)
Creatinine, Ser: 0.59 mg/dL (ref 0.44–1.00)
GFR calc Af Amer: >60 mL/min (ref >60)
GFR calc non Af Amer: >60 mL/min (ref >60)
Glucose, Bld: 86 mg/dL (ref 70–99)
Potassium: 3.7 mmol/L (ref 3.5–5.1)
Sodium: 140 mmol/L (ref 135–145)

## 2019-12-27 LAB — TROPONIN I (HIGH SENSITIVITY)
Troponin I (High Sensitivity): 11 ng/L (ref <18)
Troponin I (High Sensitivity): 12 ng/L (ref <18)

## 2019-12-27 LAB — SARS CORONAVIRUS 2 BY RT PCR (HOSPITAL ORDER, PERFORMED IN ~~LOC~~ HOSPITAL LAB): SARS Coronavirus 2: NEGATIVE

## 2019-12-27 LAB — BRAIN NATRIURETIC PEPTIDE: B Natriuretic Peptide: 154 pg/mL — ABNORMAL HIGH (ref 0.0–100.0)

## 2019-12-27 LAB — D-DIMER, QUANTITATIVE: D-Dimer, Quant: 0.35 ug/mL-FEU (ref 0.00–0.50)

## 2019-12-27 MED ORDER — METHYLPREDNISOLONE SODIUM SUCC 125 MG IJ SOLR
125.0000 mg | Freq: Once | INTRAMUSCULAR | Status: AC
  Administered 2019-12-27: 125 mg via INTRAVENOUS
  Filled 2019-12-27: qty 2

## 2019-12-27 MED ORDER — BENZONATATE 200 MG PO CAPS
200.0000 mg | ORAL_CAPSULE | Freq: Three times a day (TID) | ORAL | 0 refills | Status: DC | PRN
Start: 2019-12-27 — End: 2020-01-18

## 2019-12-27 MED ORDER — ALBUTEROL SULFATE (2.5 MG/3ML) 0.083% IN NEBU
5.0000 mg | INHALATION_SOLUTION | Freq: Once | RESPIRATORY_TRACT | Status: AC
  Administered 2019-12-27: 5 mg via RESPIRATORY_TRACT
  Filled 2019-12-27: qty 6

## 2019-12-27 MED ORDER — ALBUTEROL SULFATE HFA 108 (90 BASE) MCG/ACT IN AERS
4.0000 | INHALATION_SPRAY | Freq: Once | RESPIRATORY_TRACT | Status: AC
  Administered 2019-12-27: 4 via RESPIRATORY_TRACT
  Filled 2019-12-27: qty 6.7

## 2019-12-27 MED ORDER — DOXYCYCLINE HYCLATE 100 MG PO CAPS
100.0000 mg | ORAL_CAPSULE | Freq: Two times a day (BID) | ORAL | 0 refills | Status: DC
Start: 2019-12-27 — End: 2020-01-18

## 2019-12-27 MED ORDER — PREDNISONE 20 MG PO TABS: 40.0000 mg | ORAL_TABLET | Freq: Every day | ORAL | 0 refills | Status: DC

## 2019-12-27 NOTE — Telephone Encounter (Signed)
Pt called and states she is having sob and having trouble using her inhaler because she cannot take a deep breath. Advised pt to go to urgent care or ED.  States she will go to [facility] ED.

## 2019-12-27 NOTE — ED Notes (Signed)
Patient ambulated around nurses station.  O2 remained 95% at room air.

## 2019-12-27 NOTE — ED Triage Notes (Signed)
Pt reports cough and congestion for the past week.  Reports worsening sob today.  C/O pain under left breast that is worse with coughing.    Pt says has copd and can't inhale deep enough to use her inhaler.

## 2019-12-27 NOTE — Discharge Instructions (Signed)
Continue your daily medications as directed.  Use your albuterol inhaler 2 puffs 4 times a day for the next 3 days, then use as needed.  Take the medications as prescribed.  Follow-up with Dr. Lance Sell office for recheck, return the emergency department if you develop any worsening symptoms.  I have also listed the follow-up information for cardiology.  Call the office to arrange a follow-up appointment
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
## 2020-01-18 ENCOUNTER — Ambulatory Visit: Payer: Federal, State, Local not specified - PPO | Admitting: Family Medicine

## 2020-01-18 ENCOUNTER — Encounter: Payer: Self-pay | Admitting: Family Medicine

## 2020-01-18 ENCOUNTER — Other Ambulatory Visit: Payer: Self-pay

## 2020-01-18 VITALS — BP 134/84 | Temp 97.2°F | Wt 221.8 lb

## 2020-01-18 DIAGNOSIS — R5383 Other fatigue: Secondary | ICD-10-CM | POA: Diagnosis not present

## 2020-01-18 DIAGNOSIS — J439 Emphysema, unspecified: Secondary | ICD-10-CM

## 2020-01-18 DIAGNOSIS — E785 Hyperlipidemia, unspecified: Secondary | ICD-10-CM

## 2020-01-18 DIAGNOSIS — R9431 Abnormal electrocardiogram [ECG] [EKG]: Secondary | ICD-10-CM | POA: Diagnosis not present

## 2020-01-18 MED ORDER — BUDESONIDE-FORMOTEROL FUMARATE 80-4.5 MCG/ACT IN AERO
INHALATION_SPRAY | RESPIRATORY_TRACT | 4 refills | Status: DC
Start: 2020-01-18 — End: 2020-05-21

## 2020-01-18 NOTE — Progress Notes (Signed)
   Subjective:    Patient ID: [name], female    DOB: Nov 12, 1965, 54 y.o.   MRN: [id number]  HPI Pt here for follow up. Pt went to ER on 12/27/19 for breathing issues. Pt states that her breathing is better but not 100% better. Pt states that at the ER they did see changes on EKG and spoke to her about seeing a cardiologist. Pt has seen cardiologist before.  Patient has underlying lung issues still smokes she has been counseled to quit smoking uses albuterol uses Spiriva still finds her self coughing wheezing short of breath at times Relates occasional atypical chest pain unlikely to be cardiac EKG with some new left ventricular strain pattern was but compared to previous EKG from a couple years ago this is new  Review of Systems See above denies fever sweats chills    Objective:   Physical Exam Lungs CTA respiratory rate normal heart regular extremities no edema skin warm dry       Assessment & Plan:  COPD would benefit from Symbicort twice daily continue Spiriva add albuterol as needed  Left ventricular strain referral to cardiology for further evaluation, echo recommended,  Significant hyperlipidemia check lipid profile await results  Some fatigue tiredness check thyroid function  Patient encouraged to quit smoking  Mild obesity patient encouraged to watch diet watch portions try to lose weight not a good candidate for Adipex  Time spent with patient discussing Covid precautions as well as rationale behind getting Covid vaccine Moderna recommended via [employer]

## 2020-01-19 ENCOUNTER — Other Ambulatory Visit: Payer: Self-pay | Admitting: *Deleted

## 2020-01-19 DIAGNOSIS — R5383 Other fatigue: Secondary | ICD-10-CM

## 2020-01-19 DIAGNOSIS — R9431 Abnormal electrocardiogram [ECG] [EKG]: Secondary | ICD-10-CM

## 2020-01-19 DIAGNOSIS — R0602 Shortness of breath: Secondary | ICD-10-CM

## 2020-01-24 ENCOUNTER — Encounter: Payer: Self-pay | Admitting: Family Medicine

## 2020-01-25 ENCOUNTER — Ambulatory Visit (HOSPITAL_COMMUNITY)
Admission: RE | Admit: 2020-01-25 | Discharge: 2020-01-25 | Disposition: A | Discharge status: Home / Self Care | Payer: Federal, State, Local not specified - PPO | Source: Ambulatory Visit | Attending: Family Medicine | Admitting: Family Medicine

## 2020-01-25 ENCOUNTER — Other Ambulatory Visit: Payer: Self-pay

## 2020-01-25 DIAGNOSIS — R0602 Shortness of breath: Secondary | ICD-10-CM | POA: Diagnosis not present

## 2020-01-25 DIAGNOSIS — E785 Hyperlipidemia, unspecified: Secondary | ICD-10-CM | POA: Diagnosis not present

## 2020-01-25 DIAGNOSIS — R5383 Other fatigue: Secondary | ICD-10-CM | POA: Insufficient documentation

## 2020-01-25 DIAGNOSIS — R9431 Abnormal electrocardiogram [ECG] [EKG]: Secondary | ICD-10-CM | POA: Diagnosis not present

## 2020-01-25 DIAGNOSIS — J439 Emphysema, unspecified: Secondary | ICD-10-CM | POA: Diagnosis not present

## 2020-01-25 LAB — ECHOCARDIOGRAM COMPLETE
AR max vel: 1.45 cm2
AV Area VTI: 1.69 cm2
AV Area mean vel: 1.5 cm2
AV Mean grad: 12.8 mmHg
AV Peak grad: 25.7 mmHg
Ao pk vel: 2.54 m/s
Area-P 1/2: 2.87 cm2
S' Lateral: 2.63 cm

## 2020-01-25 NOTE — Progress Notes (Signed)
*  PRELIMINARY RESULTS* Echocardiogram 2D Echocardiogram has been performed. Patients blood pressure was178/84. Patient reported no headache, dizziness or blurred vision. Patient stated she was in the process of having a consult with cardiology. I recommended patient keep a log of her blood pressure for 5-7 days so cardiologist could review.  Leavy Cella 01/25/2020, 2:57 PM

## 2020-01-26 LAB — LIPID PANEL
Chol/HDL Ratio: 4.3 ratio (ref 0.0–4.4)
Cholesterol, Total: 185 mg/dL (ref 100–199)
HDL: 43 mg/dL (ref >39)
LDL Chol Calc (NIH): 115 mg/dL — ABNORMAL HIGH (ref 0–99)
Triglycerides: 152 mg/dL — ABNORMAL HIGH (ref 0–149)
VLDL Cholesterol Cal: 27 mg/dL (ref 5–40)

## 2020-01-26 LAB — TSH: TSH: 1.1 u[IU]/mL (ref 0.450–4.500)

## 2020-02-18 ENCOUNTER — Other Ambulatory Visit: Payer: Self-pay | Admitting: Family Medicine

## 2020-03-13 ENCOUNTER — Other Ambulatory Visit: Payer: Self-pay | Admitting: Family Medicine

## 2020-03-13 NOTE — Telephone Encounter (Signed)
Okay to refill. Please make sure there isn't some reason that she is needing to use her inhaler more than normal that we should know about. Thanks, Santiago Glad

## 2020-04-15 ENCOUNTER — Encounter: Payer: Self-pay | Admitting: Internal Medicine

## 2020-04-15 ENCOUNTER — Other Ambulatory Visit: Payer: Self-pay

## 2020-04-15 ENCOUNTER — Ambulatory Visit: Payer: Federal, State, Local not specified - PPO | Admitting: Internal Medicine

## 2020-04-15 VITALS — BP 170/100 | HR 80 | Ht 63.0 in | Wt 218.4 lb

## 2020-04-15 DIAGNOSIS — R0602 Shortness of breath: Secondary | ICD-10-CM | POA: Diagnosis not present

## 2020-04-15 DIAGNOSIS — R5383 Other fatigue: Secondary | ICD-10-CM

## 2020-04-15 DIAGNOSIS — R072 Precordial pain: Secondary | ICD-10-CM | POA: Diagnosis not present

## 2020-04-15 MED ORDER — METOPROLOL TARTRATE 100 MG PO TABS
ORAL_TABLET | ORAL | 0 refills | Status: DC
Start: 2020-04-15 — End: 2020-10-09

## 2020-04-15 MED ORDER — ASPIRIN EC 81 MG PO TBEC: 81.0000 mg | DELAYED_RELEASE_TABLET | Freq: Every day | ORAL | 3 refills | Status: AC
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
## 2020-04-15 NOTE — Patient Instructions (Addendum)
Medication Instructions:  Your physician has recommended you make the following change in your medication:  1.) start aspirin 81 mg daily (enteric coated)  *If you need a refill on your cardiac medications before your next appointment, please call your pharmacy*   Lab Work: None today If you have labs (blood work) drawn today and your tests are completely normal, you will receive your results only by: Marland Kitchen MyChart Message (if you have MyChart) OR . A paper copy in the mail If you have any lab test that is abnormal or we need to change your treatment, we will call you to review the results.   Testing/Procedures: Cardiac ct angiogram.  See instructions below.   Follow-Up: Follow up with your physician will depend on test results.  Other Instructions Your cardiac CT will be scheduled at one of the below locations:   [facility] [address] [place], [place] [address] [phone]  [facility] [address] [place], [place] [address] [phone]  If scheduled at [facility], please arrive at the [facility] main entrance of [facility] 30 minutes prior to test start time. Proceed to the [facility] Radiology Department (first floor) to check-in and test prep.  If scheduled at [facility], please arrive 15 mins early for check-in and test prep.  Please follow these instructions carefully (unless otherwise directed):   On the Night Before the Test: . Be sure to Drink plenty of water. . Do not consume any caffeinated/decaffeinated beverages or chocolate 12 hours prior to your test. Do not take any antihistamines 12 hours prior to your test.  On the Day of the Test: . Drink plenty of water. Do not drink any water within one hour of the test. . Do not eat any food 4 hours prior to the test. . You may take your regular medications prior to the test.  . Take metoprolol  (Lopressor) two hours prior to test. . FEMALES- please wear underwire-free bra if available      After the Test: . Drink plenty of water. . After receiving IV contrast, you may experience a mild flushed feeling. This is normal. . On occasion, you may experience a mild rash up to 24 hours after the test. This is not dangerous. If this occurs, you can take Benadryl 25 mg and increase your fluid intake. . If you experience trouble breathing, this can be serious. If it is severe call 911 IMMEDIATELY. If it is mild, please call our office. . If you take any of these medications: Glipizide/Metformin, Avandament, Glucavance, please do not take 48 hours after completing test unless otherwise instructed.   Once we have confirmed authorization from your insurance company, we will call you to set up a date and time for your test. Based on how quickly your insurance processes prior authorizations requests, please allow up to 4 weeks to be contacted for scheduling your Cardiac CT appointment. Be advised that routine Cardiac CT appointments could be scheduled as many as 8 weeks after your provider has ordered it.  For non-scheduling related questions, please contact the cardiac imaging nurse navigator should you have any questions/concerns: Marchia Bond, Cardiac Imaging Nurse Navigator Burley Saver, Interim Cardiac Imaging Nurse [facility] Direct Office Dial: [phone]   For scheduling needs, including cancellations and rescheduling, please call [place], [phone] (temporary number).

## 2020-04-16 ENCOUNTER — Other Ambulatory Visit: Payer: Self-pay | Admitting: Internal Medicine

## 2020-04-30 ENCOUNTER — Telehealth (HOSPITAL_COMMUNITY): Payer: Self-pay | Admitting: Emergency Medicine

## 2020-04-30 NOTE — Telephone Encounter (Signed)
Reaching out to patient to offer assistance regarding upcoming cardiac imaging study; pt verbalizes understanding of appt date/time, parking situation and where to check in, pre-test NPO status and medications ordered, and verified current allergies; name and call back number provided for further questions should they arise Jamille Fisher RN Navigator Cardiac Imaging [facility] [phone] office [phone] cell 

## 2020-05-01 ENCOUNTER — Encounter: Payer: Federal, State, Local not specified - PPO | Admitting: *Deleted

## 2020-05-01 ENCOUNTER — Ambulatory Visit (HOSPITAL_COMMUNITY)
Admission: RE | Admit: 2020-05-01 | Discharge: 2020-05-01 | Disposition: A | Discharge status: Home / Self Care | Payer: Federal, State, Local not specified - PPO | Source: Ambulatory Visit | Attending: Internal Medicine | Admitting: Internal Medicine

## 2020-05-01 ENCOUNTER — Other Ambulatory Visit: Payer: Self-pay

## 2020-05-01 DIAGNOSIS — R072 Precordial pain: Secondary | ICD-10-CM | POA: Diagnosis not present

## 2020-05-01 DIAGNOSIS — R0602 Shortness of breath: Secondary | ICD-10-CM | POA: Diagnosis not present

## 2020-05-01 DIAGNOSIS — R5383 Other fatigue: Secondary | ICD-10-CM | POA: Diagnosis not present

## 2020-05-01 DIAGNOSIS — Z006 Encounter for examination for normal comparison and control in clinical research program: Secondary | ICD-10-CM

## 2020-05-01 MED ORDER — NITROGLYCERIN 0.4 MG SL SUBL
SUBLINGUAL_TABLET | SUBLINGUAL | Status: AC
  Administered 2020-05-01: 0.8 mg via SUBLINGUAL
  Filled 2020-05-01: qty 2

## 2020-05-01 MED ORDER — IOHEXOL 350 MG/ML SOLN
80.0000 mL | Freq: Once | INTRAVENOUS | Status: AC | PRN
  Administered 2020-05-01: 80 mL via INTRAVENOUS

## 2020-05-01 MED ORDER — NITROGLYCERIN 0.4 MG SL SUBL: 0.8000 mg | SUBLINGUAL_TABLET | Freq: Once | SUBLINGUAL | Status: AC

## 2020-05-01 NOTE — Research (Signed)
Subject Name: [name]  Subject met inclusion and exclusion criteria.  The informed consent form, study requirements and expectations were reviewed with the subject and questions and concerns were addressed prior to the signing of the consent form.  The subject verbalized understanding of the trial requirements.  The subject agreed to participate in the IDENTIFY trial and signed the informed consent at 1300 on 05/01/20  The informed consent was obtained prior to performance of any protocol-specific procedures for the subject.  A copy of the signed informed consent was given to the subject and a copy was placed in the subject's medical record.   Timoteo Gaul

## 2020-05-07 ENCOUNTER — Telehealth: Payer: Self-pay

## 2020-05-07 DIAGNOSIS — Z79899 Other long term (current) drug therapy: Secondary | ICD-10-CM

## 2020-05-07 MED ORDER — ROSUVASTATIN CALCIUM 10 MG PO TABS
10.0000 mg | ORAL_TABLET | Freq: Every day | ORAL | 3 refills | Status: DC
Start: 2020-05-07 — End: 2021-05-13

## 2020-05-07 NOTE — Telephone Encounter (Signed)
Patient given results of CT, no questions. She will start Crestor 10 mg daily at dinner.i will mail her lab slip for lipid/lft's in 8 weeks.

## 2020-05-07 NOTE — Telephone Encounter (Signed)
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   Does not appear to be flow limiting.  SHould not be causing symptoms Pt should be on a statin though to lower LDL   Would recomm Crestor 10 mg with f/u lipids and Liver panel in 8 wks    Make sure pt hs f/u in clinic for BP control   THis may be contrib to symtpoms   She was nervous at visit   Needs to be reconfirmed    Also needs to quit tobacco

## 2020-05-21 ENCOUNTER — Other Ambulatory Visit: Payer: Self-pay | Admitting: Family Medicine

## 2020-08-07 ENCOUNTER — Other Ambulatory Visit: Payer: Self-pay | Admitting: Family Medicine

## 2020-09-06 ENCOUNTER — Other Ambulatory Visit: Payer: Self-pay | Admitting: Family Medicine

## 2020-09-09 ENCOUNTER — Encounter: Payer: Self-pay | Admitting: Family Medicine

## 2020-09-09 ENCOUNTER — Ambulatory Visit (INDEPENDENT_AMBULATORY_CARE_PROVIDER_SITE_OTHER): Payer: Federal, State, Local not specified - PPO | Admitting: Family Medicine

## 2020-09-09 ENCOUNTER — Other Ambulatory Visit: Payer: Self-pay

## 2020-09-09 VITALS — BP 192/80 | HR 85 | Temp 98.1°F | Ht 63.0 in | Wt 225.0 lb

## 2020-09-09 DIAGNOSIS — F322 Major depressive disorder, single episode, severe without psychotic features: Secondary | ICD-10-CM | POA: Diagnosis not present

## 2020-09-09 DIAGNOSIS — F411 Generalized anxiety disorder: Secondary | ICD-10-CM | POA: Diagnosis not present

## 2020-09-09 HISTORY — DX: Major depressive disorder, single episode, severe without psychotic features: F32.2

## 2020-09-09 MED ORDER — ALPRAZOLAM 0.25 MG PO TABS: ORAL_TABLET | ORAL | 0 refills | Status: DC

## 2020-09-09 MED ORDER — ESCITALOPRAM OXALATE 10 MG PO TABS: 10.0000 mg | ORAL_TABLET | Freq: Every day | ORAL | 1 refills | Status: DC

## 2020-09-09 NOTE — Patient Instructions (Addendum)
Serotonin reuptake inhibitor (SSRI) anti-depressant medications:  Are used to treat depression, OCD, panic disorder, PTSD, and social anxiety.   Warning:  There is some evidence that when you start on this medication class it can cause an increased risk of suicide (thoughts of self-harm) in patients 24 years and younger. All patients regardless of age should be aware of this serious side effect from this drug class.   * Do not stop this medication abruptly. Alert your healthcare provider if you are going to stop this medication so you can be safely weaned off of this drug.    Major Depressive Disorder, Adult Major depressive disorder is a mental health condition. This disorder affects feelings. It can also affect the body. Symptoms of this condition last most of the day, almost every day, for 2 weeks. This disorder can affect:  Relationships.  Daily activities, such as work and school.  Activities that you normally like to do. What are the causes? The cause of this condition is not known. The disorder is likely caused by a mix of things, including:  Your personality, such as being a shy person.  Your behavior, or how you act toward others.  Your thoughts and feelings.  Too much alcohol or drugs.  How you react to stress.  Health and mental problems that you have had for a long time.  Things that hurt you in the past (trauma).  Big changes in your life, such as divorce. What increases the risk? The following factors may make you more likely to develop this condition:  Having family members with depression.  Being a woman.  Problems in the family.  Low levels of some brain chemicals.  Things that caused you pain as a child, especially if you lost a parent or were abused.  A lot of stress in your life, such as from: ? Living without basic needs of life, such as food and shelter. ? Being treated poorly because of race, sex, or religion (discrimination).  Health and  mental problems that you have had for a long time. What are the signs or symptoms? The main symptoms of this condition are:  Being sad all the time.  Being grouchy all the time.  Loss of interest in things and activities. Other symptoms include:  Sleeping too much or too little.  Eating too much or too little.  Gaining or losing weight, without knowing why.  Feeling tired or having low energy.  Being restless and weak.  Feeling hopeless, worthless, or guilty.  Trouble thinking clearly or making decisions.  Thoughts of hurting yourself or others, or thoughts of ending your life.  Spending a lot of time alone.  Inability to complete common tasks of daily life. If you have very bad MDD, you may:  Believe things that are not true.  Hear, see, taste, or feel things that are not there.  Have mild depression that lasts for at least 2 years.  Feel very sad and hopeless.  Have trouble speaking or moving. How is this treated? This condition may be treated with:  Talk therapy. This teaches you to know bad thoughts, feelings, and actions and how to change them. ? This can also help you to communicate with others. ? This can be done with members of your family.  Medicines. These can be used to treat worry (anxiety), depression, or low levels of chemicals in the brain.  Lifestyle changes. You may need to: ? Limit alcohol use. ? Limit drug use. ? Get  regular exercise. ? Get plenty of sleep. ? Make healthy eating choices. ? Spend more time outdoors.  Brain stimulation. This treatment excites the brain. This is done when symptoms are very bad or have not gotten better with other treatments. Follow these instructions at home: Activity  Get regular exercise as told.  Spend time outdoors as told.  Make time to do the things you enjoy.  Find ways to deal with stress. Try to: ? Meditate. ? Do deep breathing. ? Spend time in nature. ? Keep a journal.  Return to your  normal activities as told by your doctor. Ask your doctor what activities are safe for you. Alcohol and drug use  If you drink alcohol: ? Limit how much you use to:  0-1 drink a day for women.  0-2 drinks a day for men. ? Be aware of how much alcohol is in your drink. In the [place] one drink equals one 12 oz bottle of beer (355 mL), one 5 oz glass of wine (148 mL), or one 1 oz glass of hard liquor (44 mL).  Talk to your doctor about: ? Alcohol use. Alcohol can affect some medicines. ? Any drug use. General instructions  Take over-the-counter and prescription medicines and herbal preparations only as told by your doctor.  Eat a healthy diet.  Get a lot of sleep.  Think about joining a support group. Your doctor may be able to suggest one.  Keep all follow-up visits as told by your doctor. This is important.   Where to find more information:  [employer] on Mental Illness: www.nami.[place]: https://carter.com/  American Psychiatric Association: www.psychiatry.org/patients-families/ Contact a doctor if:  Your symptoms get worse.  You get new symptoms. Get help right away if:  You hurt yourself.  You have serious thoughts about hurting yourself or others.  You see, hear, taste, smell, or feel things that are not there. If you ever feel like you may hurt yourself or others, or have thoughts about taking your own life, get help right away. Go to your nearest emergency department or:  Call your local emergency services (911 in the [place]  Call a suicide crisis helpline, such as the [facility] at (831)716-9667. This is open 24 hours a day in the [place]  Text the Crisis Text Line at (514) 219-3930 (in the [place].). Summary  Major depressive disorder is a mental health condition. This disorder affects feelings. Symptoms of this condition last most of the day, almost every day, for 2 weeks.  The symptoms of this  disorder can cause problems with relationships and with daily activities.  There are treatments and support for people who get this disorder. You may need more than one type of treatment.  Get help right away if you have serious thoughts about hurting yourself or others. This information is not intended to replace advice given to you by your health care provider. Make sure you discuss any questions you have with your health care provider. Document Revised: 04/29/2019 Document Reviewed: 04/29/2019 Elsevier Patient Education  2021 [employer].

## 2020-09-09 NOTE — Progress Notes (Signed)
Patient ID: [name], female    DOB: 12-28-1965, 55 y.o.   MRN: [id number]   Chief Complaint  Patient presents with  . Anxiety    Depression    Subjective:  Cc: discuss stress and anxiety/depresion   This is a new problem.  Presents today to discuss stress.  Reports that she has a very stressful job, works for the [employer].  Feels as though she might "snap "needs a better worklife balance.  Denies suicidal thoughts, thoughts of self self-harm.  Reports that she has taken Prozac in the past, this made her feel angry.  She went online, was prescribed Remeron and Strattera, stopped taking it after 1 week it made her feel horrible.  Her blood pressure is elevated in the office today.  She reports that she does have COPD, gets short of breath occasionally.      Medical History Naeema has a past medical history of Acute pericarditis (05/10/2016), ADD (attention deficit disorder), Bipolar 1 disorder ([place]), CHF (congestive heart failure) ([place]), COPD (chronic obstructive pulmonary disease) ([place]), Depression, GERD (gastroesophageal reflux disease), Hypertension, IFG (impaired fasting glucose), and Substance abuse ([place]).   Outpatient Encounter Medications as of 09/09/2020  Medication Sig  . acetaminophen (TYLENOL) 500 MG tablet Take 1,500 mg by mouth every 6 (six) hours as needed.  Marland Kitchen albuterol (VENTOLIN HFA) 108 (90 Base) MCG/ACT inhaler INHALE 2 PUFFS INTO THE LUNGS EVERY 6 HOURS AS NEEDED FOR WHEEZING OR SHORTNESS OF BREATH  . ALPRAZolam (XANAX) 0.25 MG tablet Take one tablet by mouth daily as needed for severe anxiety.  Marland Kitchen aspirin EC 81 MG tablet Take 1 tablet (81 mg total) by mouth daily. Swallow whole.  . escitalopram (LEXAPRO) 10 MG tablet Take 1 tablet (10 mg total) by mouth daily.  Marland Kitchen SPIRIVA HANDIHALER 18 MCG inhalation capsule INHALE THE CONTENTS OF 1 CAPSULE VIA INHALATION DEVICE EVERY DAY  . budesonide-formoterol (SYMBICORT) 80-4.5 MCG/ACT inhaler INHALE 2 PUFFS BY MOUTH  TWICE DAILY (Patient not taking: Reported on 09/09/2020)  . metoprolol tartrate (LOPRESSOR) 100 MG tablet Take one tablet by mouth 2 hours before ct scan (Patient not taking: Reported on 09/09/2020)  . rosuvastatin (CRESTOR) 10 MG tablet Take 1 tablet (10 mg total) by mouth daily with supper.   No facility-administered encounter medications on file as of 09/09/2020.     Review of Systems  Respiratory: Positive for chest tightness and shortness of breath.        Has COPD, has shortness of breath occasionally.   Cardiovascular: Negative for chest pain.  Psychiatric/Behavioral: Negative for self-injury and suicidal ideas.       Having lots of stress with job. Works with post office.      Vitals BP (!) 192/80   Pulse 85   Temp 98.1 F (36.7 C)   Ht 5\' 3"  (1.6 m)   Wt 225 lb (102.1 kg)   LMP 03/18/2013 (Approximate)   SpO2 99%   BMI 39.86 kg/m   Objective:   Physical Exam Vitals reviewed.  Constitutional:      Appearance: Normal appearance.  Cardiovascular:     Rate and Rhythm: Normal rate and regular rhythm.     Heart sounds: Normal heart sounds.  Pulmonary:     Effort: Pulmonary effort is normal.     Breath sounds: Normal breath sounds.  Skin:    General: Skin is warm and dry.  Neurological:     General: No focal deficit present.     Mental Status: She  is alert.  Psychiatric:        Behavior: Behavior normal.     Comments: GAD-7: 19 PHQ-9: 24 Denies thoughts of suicide.       Assessment and Plan   1. Depression, major, single episode, severe (HCC) - escitalopram (LEXAPRO) 10 MG tablet; Take 1 tablet (10 mg total) by mouth daily.  Dispense: 30 tablet; Refill: 1 - ALPRAZolam (XANAX) 0.25 MG tablet; Take one tablet by mouth daily as needed for severe anxiety.  Dispense: 20 tablet; Refill: 0 - Ambulatory referral to Psychology  2. GAD (generalized anxiety disorder) - escitalopram (LEXAPRO) 10 MG tablet; Take 1 tablet (10 mg total) by mouth daily.  Dispense: 30  tablet; Refill: 1 - ALPRAZolam (XANAX) 0.25 MG tablet; Take one tablet by mouth daily as needed for severe anxiety.  Dispense: 20 tablet; Refill: 0 - Ambulatory referral to Psychology    Discussed lifestyle modification: walking, mediatation. Wishes to start medication today.  Blood pressure elevated in the office today, upon recheck not improved.  She wishes to start on an SSRI today for her depression and anxiety.  Also request one-time prescription for alprazolam for situational relief.  Discussed harms of benzodiazepines, reiterated this is only a one-time prescription.  Serotonin reuptake inhibitor (SSRI) anti-depressant medications:  Are used to treat depression, OCD, panic disorder, PTSD, and social anxiety.   Warning:  There is some evidence that when you start on this medication class it can cause an increased risk of suicide (thoughts of self-harm) in patients 24 years and younger. All patients regardless of age should be aware of this serious side effect from this drug class.   * Do not stop this medication abruptly. Alert your healthcare provider if you are going to stop this medication so you can be safely weaned off of this drug.  High-risk medication warning: Opioids and benzodiazepines are medications that can cause dependence. You should only take them on an as needed basis. Do not drive and/or operate dangerous machinery while taking these medications. It is advised to take these medications the minimal amount needed.   Agrees with plan of care discussed today. Understands warning signs to seek further care: chest pain, shortness of breath, any significant change in health.  Understands to follow-up in one month, sooner if anything changes.  I agree that she needs better worklife balance, will give work note today.  She will be out from April 7 through the 24th, return on September 23, 2020.  Encouraged her to start walking, guided meditation, and to take her SSRI.  Warnings  given on SSRI therapy.  Referral sent for counseling.  If blood pressure continues to be elevated, will consider medications at 1 month follow-up.   Pecolia Ades, NP 09/09/2020

## 2020-09-12 ENCOUNTER — Ambulatory Visit (INDEPENDENT_AMBULATORY_CARE_PROVIDER_SITE_OTHER): Payer: Federal, State, Local not specified - PPO | Admitting: Clinical

## 2020-09-12 ENCOUNTER — Other Ambulatory Visit: Payer: Self-pay

## 2020-09-12 ENCOUNTER — Encounter (HOSPITAL_COMMUNITY): Payer: Self-pay

## 2020-09-12 DIAGNOSIS — F331 Major depressive disorder, recurrent, moderate: Secondary | ICD-10-CM

## 2020-09-12 DIAGNOSIS — F419 Anxiety disorder, unspecified: Secondary | ICD-10-CM
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
## 2020-09-16 ENCOUNTER — Telehealth: Payer: Self-pay

## 2020-09-16 DIAGNOSIS — Z029 Encounter for administrative examinations, unspecified: Secondary | ICD-10-CM

## 2020-09-16 NOTE — Telephone Encounter (Signed)
Patient brought in FMLA to be completed in your box in office

## 2020-09-17 ENCOUNTER — Telehealth: Payer: Self-pay | Admitting: *Deleted

## 2020-09-17 DIAGNOSIS — Z006 Encounter for examination for normal comparison and control in clinical research program: Secondary | ICD-10-CM

## 2020-09-17 NOTE — Telephone Encounter (Signed)
I called patient for 90-day phone call for Identify Study. Patient is not having any chest pain but dealing with anxiety. I reminded patient I would call her in Dec for 1-year follow-up.

## 2020-10-06 ENCOUNTER — Other Ambulatory Visit: Payer: Self-pay | Admitting: Family Medicine

## 2020-10-07 ENCOUNTER — Other Ambulatory Visit: Payer: Self-pay | Admitting: Family Medicine

## 2020-10-09 ENCOUNTER — Other Ambulatory Visit: Payer: Self-pay

## 2020-10-09 ENCOUNTER — Ambulatory Visit (INDEPENDENT_AMBULATORY_CARE_PROVIDER_SITE_OTHER): Payer: Federal, State, Local not specified - PPO | Admitting: Family Medicine

## 2020-10-09 ENCOUNTER — Encounter: Payer: Self-pay | Admitting: Family Medicine

## 2020-10-09 ENCOUNTER — Other Ambulatory Visit: Payer: Self-pay | Admitting: Family Medicine

## 2020-10-09 VITALS — BP 181/96 | HR 80 | Temp 97.5°F | Wt 226.0 lb

## 2020-10-09 DIAGNOSIS — R030 Elevated blood-pressure reading, without diagnosis of hypertension: Secondary | ICD-10-CM

## 2020-10-09 DIAGNOSIS — F322 Major depressive disorder, single episode, severe without psychotic features: Secondary | ICD-10-CM | POA: Diagnosis not present

## 2020-10-09 DIAGNOSIS — F411 Generalized anxiety disorder: Secondary | ICD-10-CM | POA: Diagnosis not present

## 2020-10-09 MED ORDER — ESCITALOPRAM OXALATE 20 MG PO TABS: 20.0000 mg | ORAL_TABLET | Freq: Every day | ORAL | 1 refills | Status: DC

## 2020-10-09 MED ORDER — AMLODIPINE BESYLATE 5 MG PO TABS: 5.0000 mg | ORAL_TABLET | Freq: Every day | ORAL | 1 refills | Status: DC

## 2020-10-09 NOTE — Progress Notes (Signed)
Patient ID: [name], female    DOB: [birth year], 55 y.o.   MRN: [id number]   Chief Complaint  Patient presents with  . Depression   Subjective:  CC: follow-up depression and anxiety  This is not a new problem.  Presents today to follow-up for depression and anxiety.  Initially seen on April 11, started on Lexapro 10 mg.  Reports that her symptoms are little better, still not completely controlled, feels like her work situation is somewhat better, still works for a very Radiographer, therapeutic.  Has been connected with a counselor, had the introduction session, her next appointment is scheduled for Oct 17, 2020.  FMLA papers have been completed by this provider.  Much calmer in the office today, not tearful.  Reports that she no longer has thoughts of self harm.  Continues to have sleep disturbance.  Has a history of COPD, in which she has shortness of breath occasionally.  Patient is a smoker.   Pt here for follow up on depression and anxiety. Pt has really bad headaches the first 2 weeks of taking Lexapr. but has calmed down now. Pt taking Lexapro 10 mg daily.  Medical History [name] has a past medical history of Acute pericarditis (05/10/2016), ADD (attention deficit disorder), Bipolar 1 disorder ([place]), CHF (congestive heart failure) ([place]), COPD (chronic obstructive pulmonary disease) ([place]), Depression, GERD (gastroesophageal reflux disease), Hypertension, IFG (impaired fasting glucose), and Substance abuse ([place]).   Outpatient Encounter Medications as of 10/09/2020  Medication Sig  . acetaminophen (TYLENOL) 500 MG tablet Take 1,500 mg by mouth every 6 (six) hours as needed.  Marland Kitchen albuterol (VENTOLIN HFA) 108 (90 Base) MCG/ACT inhaler INHALE 2 PUFFS INTO THE LUNGS EVERY 6 HOURS AS NEEDED FOR WHEEZING OR SHORTNESS OF BREATH  . ALPRAZolam (XANAX) 0.25 MG tablet Take one tablet by mouth daily as needed for severe anxiety.  Marland Kitchen amLODipine (NORVASC) 5 MG tablet Take 1 tablet (5 mg total) by  mouth daily.  Marland Kitchen aspirin EC 81 MG tablet Take 1 tablet (81 mg total) by mouth daily. Swallow whole.  . escitalopram (LEXAPRO) 20 MG tablet Take 1 tablet (20 mg total) by mouth daily.  Marland Kitchen SPIRIVA HANDIHALER 18 MCG inhalation capsule INHALE THE CONTENTS OF 1 CAPSULE VIA INHALATION DEVICE EVERY DAY  . [DISCONTINUED] escitalopram (LEXAPRO) 10 MG tablet Take 1 tablet (10 mg total) by mouth daily.  . budesonide-formoterol (SYMBICORT) 80-4.5 MCG/ACT inhaler INHALE 2 PUFFS BY MOUTH TWICE DAILY (Patient not taking: No sig reported)  . rosuvastatin (CRESTOR) 10 MG tablet Take 1 tablet (10 mg total) by mouth daily with supper.  . [DISCONTINUED] metoprolol tartrate (LOPRESSOR) 100 MG tablet Take one tablet by mouth 2 hours before ct scan (Patient not taking: Reported on 09/09/2020)   No facility-administered encounter medications on file as of 10/09/2020.     Review of Systems  Constitutional: Positive for appetite change (reports when started taking Lexapro appetitie was affected) and fatigue. Negative for chills, fever and unexpected weight change.  Respiratory: Positive for shortness of breath (has COPD) and wheezing (COPD, on Albuterol).   Cardiovascular: Negative for chest pain.  Neurological: Positive for headaches (blood pressure elevated).  Psychiatric/Behavioral: Positive for decreased concentration and sleep disturbance (not sleeping well). Negative for self-injury and suicidal ideas. The patient is nervous/anxious.      Vitals BP (!) 181/96   Pulse 80   Temp (!) 97.5 F (36.4 C)   Wt 226 lb (102.5 kg)   LMP 03/18/2013 (Approximate)  SpO2 96%   BMI 40.03 kg/m   Objective:   Physical Exam Vitals reviewed.  Constitutional:      Appearance: Normal appearance.  Cardiovascular:     Rate and Rhythm: Normal rate and regular rhythm.     Heart sounds: Normal heart sounds.  Pulmonary:     Effort: Pulmonary effort is normal.     Breath sounds: Normal breath sounds.  Skin:    General:  Skin is warm and dry.  Neurological:     General: No focal deficit present.     Mental Status: She is alert.  Psychiatric:        Behavior: Behavior normal.      Assessment and Plan   1. Elevated BP without diagnosis of hypertension - amLODipine (NORVASC) 5 MG tablet; Take 1 tablet (5 mg total) by mouth daily.  Dispense: 30 tablet; Refill: 1  2. GAD (generalized anxiety disorder) - escitalopram (LEXAPRO) 20 MG tablet; Take 1 tablet (20 mg total) by mouth daily.  Dispense: 30 tablet; Refill: 1  3. Depression, major, single episode, severe (HCC) - escitalopram (LEXAPRO) 20 MG tablet; Take 1 tablet (20 mg total) by mouth daily.  Dispense: 30 tablet; Refill: 1   Blood pressure elevated, will start amlodipine 5 mg today with close follow-up.  Lexapro dose increased to 20 mg daily.  Counseling appointment scheduled for May 19.  Recommend taking blood pressure medication today, rechecking blood pressure a few times this coming week to ensure blood pressure is starting to decline.  Encouraged relaxation today, getting outside, decreasing the amount of cigarettes consumed.  Agrees with plan of care discussed today. Understands warning signs to seek further care: chest pain, shortness of breath, any significant change in health.  Understands to follow-up in 1 week for blood pressure check and assessing new dose for antidepressant,  sooner if anything changes.  Understands the importance of getting her blood pressure under control to avoid stroke.   Chalmers Guest, NP 10/09/2020

## 2020-10-09 NOTE — Patient Instructions (Addendum)
DASH Eating Plan DASH stands for Dietary Approaches to Stop Hypertension. The DASH eating plan is a healthy eating plan that has been shown to:  Reduce high blood pressure (hypertension).  Reduce your risk for type 2 diabetes, heart disease, and stroke.  Help with weight loss. What are tips for following this plan? Reading food labels  Check food labels for the amount of salt (sodium) per serving. Choose foods with less than 5 percent of the Daily Value of sodium. Generally, foods with less than 300 milligrams (mg) of sodium per serving fit into this eating plan.  To find whole grains, look for the word "whole" as the first word in the ingredient list. Shopping  Buy products labeled as "low-sodium" or "no salt added."  Buy fresh foods. Avoid canned foods and pre-made or frozen meals. Cooking  Avoid adding salt when cooking. Use salt-free seasonings or herbs instead of table salt or sea salt. Check with your health care provider or pharmacist before using salt substitutes.  Do not fry foods. Cook foods using healthy methods such as baking, boiling, grilling, roasting, and broiling instead.  Cook with heart-healthy oils, such as olive, canola, avocado, soybean, or sunflower oil. Meal planning  Eat a balanced diet that includes: ? 4 or more servings of fruits and 4 or more servings of vegetables each day. Try to fill one-half of your plate with fruits and vegetables. ? 6-8 servings of whole grains each day. ? Less than 6 oz (170 g) of lean meat, poultry, or fish each day. A 3-oz (85-g) serving of meat is about the same size as a deck of cards. One egg equals 1 oz (28 g). ? 2-3 servings of low-fat dairy each day. One serving is 1 cup (237 mL). ? 1 serving of nuts, seeds, or beans 5 times each week. ? 2-3 servings of heart-healthy fats. Healthy fats called omega-3 fatty acids are found in foods such as walnuts, flaxseeds, fortified milks, and eggs. These fats are also found in  cold-water fish, such as sardines, salmon, and mackerel.  Limit how much you eat of: ? Canned or prepackaged foods. ? Food that is high in trans fat, such as some fried foods. ? Food that is high in saturated fat, such as fatty meat. ? Desserts and other sweets, sugary drinks, and other foods with added sugar. ? Full-fat dairy products.  Do not salt foods before eating.  Do not eat more than 4 egg yolks a week.  Try to eat at least 2 vegetarian meals a week.  Eat more home-cooked food and less restaurant, buffet, and fast food.   Lifestyle  When eating at a restaurant, ask that your food be prepared with less salt or no salt, if possible.  If you drink alcohol: ? Limit how much you use to:  0-1 drink a day for women who are not pregnant.  0-2 drinks a day for men. ? Be aware of how much alcohol is in your drink. In the [place] one drink equals one 12 oz bottle of beer (355 mL), one 5 oz glass of wine (148 mL), or one 1 oz glass of hard liquor (44 mL). General information  Avoid eating more than 2,300 mg of salt a day. If you have hypertension, you may need to reduce your sodium intake to 1,500 mg a day.  Work with your health care provider to maintain a healthy body weight or to lose weight. Ask what an ideal weight is for you.    Get at least 30 minutes of exercise that causes your heart to beat faster (aerobic exercise) most days of the week. Activities may include walking, swimming, or biking.  Work with your health care provider or dietitian to adjust your eating plan to your individual calorie needs. What foods should I eat? Fruits All fresh, dried, or frozen fruit. Canned fruit in natural juice (without added sugar). Vegetables Fresh or frozen vegetables (raw, steamed, roasted, or grilled). Low-sodium or reduced-sodium tomato and vegetable juice. Low-sodium or reduced-sodium tomato sauce and tomato paste. Low-sodium or reduced-sodium canned vegetables. Grains Whole-grain  or whole-wheat bread. Whole-grain or whole-wheat pasta. Brown rice. Oatmeal. Quinoa. Bulgur. Whole-grain and low-sodium cereals. Pita bread. Low-fat, low-sodium crackers. Whole-wheat flour tortillas. Meats and other proteins Skinless chicken or turkey. Ground chicken or turkey. Pork with fat trimmed off. Fish and seafood. Egg whites. Dried beans, peas, or lentils. Unsalted nuts, nut butters, and seeds. Unsalted canned beans. Lean cuts of beef with fat trimmed off. Low-sodium, lean precooked or cured meat, such as sausages or meat loaves. Dairy Low-fat (1%) or fat-free (skim) milk. Reduced-fat, low-fat, or fat-free cheeses. Nonfat, low-sodium ricotta or cottage cheese. Low-fat or nonfat yogurt. Low-fat, low-sodium cheese. Fats and oils Soft margarine without trans fats. Vegetable oil. Reduced-fat, low-fat, or light mayonnaise and salad dressings (reduced-sodium). Canola, safflower, olive, avocado, soybean, and sunflower oils. Avocado. Seasonings and condiments Herbs. Spices. Seasoning mixes without salt. Other foods Unsalted popcorn and pretzels. Fat-free sweets. The items listed above may not be a complete list of foods and beverages you can eat. Contact a dietitian for more information. What foods should I avoid? Fruits Canned fruit in a light or heavy syrup. Fried fruit. Fruit in cream or butter sauce. Vegetables Creamed or fried vegetables. Vegetables in a cheese sauce. Regular canned vegetables (not low-sodium or reduced-sodium). Regular canned tomato sauce and paste (not low-sodium or reduced-sodium). Regular tomato and vegetable juice (not low-sodium or reduced-sodium). Pickles. Olives. Grains Baked goods made with fat, such as croissants, muffins, or some breads. Dry pasta or rice meal packs. Meats and other proteins Fatty cuts of meat. Ribs. Fried meat. Bacon. Bologna, salami, and other precooked or cured meats, such as sausages or meat loaves. Fat from the back of a pig (fatback).  Bratwurst. Salted nuts and seeds. Canned beans with added salt. Canned or smoked fish. Whole eggs or egg yolks. Chicken or turkey with skin. Dairy Whole or 2% milk, cream, and half-and-half. Whole or full-fat cream cheese. Whole-fat or sweetened yogurt. Full-fat cheese. Nondairy creamers. Whipped toppings. Processed cheese and cheese spreads. Fats and oils Butter. Stick margarine. Lard. Shortening. Ghee. Bacon fat. Tropical oils, such as coconut, palm kernel, or palm oil. Seasonings and condiments Onion salt, garlic salt, seasoned salt, table salt, and sea salt. Worcestershire sauce. Tartar sauce. Barbecue sauce. Teriyaki sauce. Soy sauce, including reduced-sodium. Steak sauce. Canned and packaged gravies. Fish sauce. Oyster sauce. Cocktail sauce. Store-bought horseradish. Ketchup. Mustard. Meat flavorings and tenderizers. Bouillon cubes. Hot sauces. Pre-made or packaged marinades. Pre-made or packaged taco seasonings. Relishes. Regular salad dressings. Other foods Salted popcorn and pretzels. The items listed above may not be a complete list of foods and beverages you should avoid. Contact a dietitian for more information. Where to find more information  National Heart, Lung, and Blood Institute: www.nhlbi.nih.gov  American Heart Association: www.heart.org  Academy of Nutrition and Dietetics: www.eatright.org  National Kidney Foundation: www.kidney.org Summary  The DASH eating plan is a healthy eating plan that has been shown to reduce high   blood pressure (hypertension). It may also reduce your risk for type 2 diabetes, heart disease, and stroke.  When on the DASH eating plan, aim to eat more fresh fruits and vegetables, whole grains, lean proteins, low-fat dairy, and heart-healthy fats.  With the DASH eating plan, you should limit salt (sodium) intake to 2,300 mg a day. If you have hypertension, you may need to reduce your sodium intake to 1,500 mg a day.  Work with your health care  provider or dietitian to adjust your eating plan to your individual calorie needs. This information is not intended to replace advice given to you by your health care provider. Make sure you discuss any questions you have with your health care provider. Document Revised: 04/21/2019 Document Reviewed: 04/21/2019 Elsevier Patient Education  2021 [employer]. Managing Your Hypertension Hypertension, also called high blood pressure, is when the force of the blood pressing against the walls of the arteries is too strong. Arteries are blood vessels that carry blood from your heart throughout your body. Hypertension forces the heart to work harder to pump blood and may cause the arteries to become narrow or stiff. Understanding blood pressure readings Your personal target blood pressure may vary depending on your medical conditions, your age, and other factors. A blood pressure reading includes a higher number over a lower number. Ideally, your blood pressure should be below 120/80. You should know that:  The first, or top, number is called the systolic pressure. It is a measure of the pressure in your arteries as your heart beats.  The second, or bottom number, is called the diastolic pressure. It is a measure of the pressure in your arteries as the heart relaxes. Blood pressure is classified into four stages. Based on your blood pressure reading, your health care provider may use the following stages to determine what type of treatment you need, if any. Systolic pressure and diastolic pressure are measured in a unit called mmHg. Normal  Systolic pressure: below 338.  Diastolic pressure: below 80. Elevated  Systolic pressure: 250-539.  Diastolic pressure: below 80. Hypertension stage 1  Systolic pressure: 767-341.  Diastolic pressure: 93-79. Hypertension stage 2  Systolic pressure: 024 or above.  Diastolic pressure: 90 or above. How can this condition affect me? Managing your  hypertension is an important responsibility. Over time, hypertension can damage the arteries and decrease blood flow to important parts of the body, including the brain, heart, and kidneys. Having untreated or uncontrolled hypertension can lead to:  A heart attack.  A stroke.  A weakened blood vessel (aneurysm).  Heart failure.  Kidney damage.  Eye damage.  Metabolic syndrome.  Memory and concentration problems.  Vascular dementia. What actions can I take to manage this condition? Hypertension can be managed by making lifestyle changes and possibly by taking medicines. Your health care provider will help you make a plan to bring your blood pressure within a normal range. Nutrition  Eat a diet that is high in fiber and potassium, and low in salt (sodium), added sugar, and fat. An example eating plan is called the Dietary Approaches to Stop Hypertension (DASH) diet. To eat this way: ? Eat plenty of fresh fruits and vegetables. Try to fill one-half of your plate at each meal with fruits and vegetables. ? Eat whole grains, such as whole-wheat pasta, brown rice, or whole-grain bread. Fill about one-fourth of your plate with whole grains. ? Eat low-fat dairy products. ? Avoid fatty cuts of meat, processed or cured meats, and  poultry with skin. Fill about one-fourth of your plate with lean proteins such as fish, chicken without skin, beans, eggs, and tofu. ? Avoid pre-made and processed foods. These tend to be higher in sodium, added sugar, and fat.  Reduce your daily sodium intake. Most people with hypertension should eat less than 1,500 mg of sodium a day.   Lifestyle  Work with your health care provider to maintain a healthy body weight or to lose weight. Ask what an ideal weight is for you.  Get at least 30 minutes of exercise that causes your heart to beat faster (aerobic exercise) most days of the week. Activities may include walking, swimming, or biking.  Include exercise to  strengthen your muscles (resistance exercise), such as weight lifting, as part of your weekly exercise routine. Try to do these types of exercises for 30 minutes at least 3 days a week.  Do not use any products that contain nicotine or tobacco, such as cigarettes, e-cigarettes, and chewing tobacco. If you need help quitting, ask your health care provider.  Control any long-term (chronic) conditions you have, such as high cholesterol or diabetes.  Identify your sources of stress and find ways to manage stress. This may include meditation, deep breathing, or making time for fun activities.   Alcohol use  Do not drink alcohol if: ? Your health care provider tells you not to drink. ? You are pregnant, may be pregnant, or are planning to become pregnant.  If you drink alcohol: ? Limit how much you use to:  0-1 drink a day for women.  0-2 drinks a day for men. ? Be aware of how much alcohol is in your drink. In the [place] one drink equals one 12 oz bottle of beer (355 mL), one 5 oz glass of wine (148 mL), or one 1 oz glass of hard liquor (44 mL). Medicines Your health care provider may prescribe medicine if lifestyle changes are not enough to get your blood pressure under control and if:  Your systolic blood pressure is 130 or higher.  Your diastolic blood pressure is 80 or higher. Take medicines only as told by your health care provider. Follow the directions carefully. Blood pressure medicines must be taken as told by your health care provider. The medicine does not work as well when you skip doses. Skipping doses also puts you at risk for problems. Monitoring Before you monitor your blood pressure:  Do not smoke, drink caffeinated beverages, or exercise within 30 minutes before taking a measurement.  Use the bathroom and empty your bladder (urinate).  Sit quietly for at least 5 minutes before taking measurements. Monitor your blood pressure at home as told by your health care  provider. To do this:  Sit with your back straight and supported.  Place your feet flat on the floor. Do not cross your legs.  Support your arm on a flat surface, such as a table. Make sure your upper arm is at heart level.  Each time you measure, take two or three readings one minute apart and record the results. You may also need to have your blood pressure checked regularly by your health care provider.   General information  Talk with your health care provider about your diet, exercise habits, and other lifestyle factors that may be contributing to hypertension.  Review all the medicines you take with your health care provider because there may be side effects or interactions.  Keep all visits as told by your health care  provider. Your health care provider can help you create and adjust your plan for managing your high blood pressure. Where to find more information  National Heart, Lung, and Blood Institute: PopSteam.is  American Heart Association: www.heart.org Contact a health care provider if:  You think you are having a reaction to medicines you have taken.  You have repeated (recurrent) headaches.  You feel dizzy.  You have swelling in your ankles.  You have trouble with your vision. Get help right away if:  You develop a severe headache or confusion.  You have unusual weakness or numbness, or you feel faint.  You have severe pain in your chest or abdomen.  You vomit repeatedly.  You have trouble breathing. These symptoms may represent a serious problem that is an emergency. Do not wait to see if the symptoms will go away. Get medical help right away. Call your local emergency services (911 in the [place] Do not drive yourself to the hospital. Summary  Hypertension is when the force of blood pumping through your arteries is too strong. If this condition is not controlled, it may put you at risk for serious complications.  Your personal target blood  pressure may vary depending on your medical conditions, your age, and other factors. For most people, a normal blood pressure is less than 120/80.  Hypertension is managed by lifestyle changes, medicines, or both.  Lifestyle changes to help manage hypertension include losing weight, eating a healthy, low-sodium diet, exercising more, stopping smoking, and limiting alcohol. This information is not intended to replace advice given to you by your health care provider. Make sure you discuss any questions you have with your health care provider. Document Revised: 06/23/2019 Document Reviewed: 04/18/2019 Elsevier Patient Education  2021 Elsevier Inc.  http://APA.org/depression-guideline"> https://clinicalkey.com"> http://point-of-care.elsevierperformancemanager.com/skills/"> http://point-of-care.elsevierperformancemanager.com">  Managing Depression, Adult Depression is a mental health condition that affects your thoughts, feelings, and actions. Being diagnosed with depression can bring you relief if you did not know why you have felt or behaved a certain way. It could also leave you feeling overwhelmed with uncertainty about your future. Preparing yourself to manage your symptoms can help you feel more positive about your future. How to manage lifestyle changes Managing stress Stress is your body's reaction to life changes and events, both good and bad. Stress can add to your feelings of depression. Learning to manage your stress can help lessen your feelings of depression. Try some of the following approaches to reducing your stress (stress reduction techniques):  Listen to music that you enjoy and that inspires you.  Try using a meditation app or take a meditation class.  Develop a practice that helps you connect with your spiritual self. Walk in nature, pray, or go to a place of worship.  Do some deep breathing. To do this, inhale slowly through your nose. Pause at the top of your inhale for a  few seconds and then exhale slowly, letting your muscles relax.  Practice yoga to help relax and work your muscles. Choose a stress reduction technique that suits your lifestyle and personality. These techniques take time and practice to develop. Set aside 5-15 minutes a day to do them. Therapists can offer training in these techniques. Other things you can do to manage stress include:  Keeping a stress diary.  Knowing your limits and saying no when you think something is too much.  Paying attention to how you react to certain situations. You may not be able to control everything, but you can change your  reaction.  Adding humor to your life by watching funny films or TV shows.  Making time for activities that you enjoy and that relax you.   Medicines Medicines, such as antidepressants, are often a part of treatment for depression.  Talk with your pharmacist or health care provider about all the medicines, supplements, and herbal products that you take, their possible side effects, and what medicines and other products are safe to take together.  Make sure to report any side effects you may have to your health care provider. Relationships Your health care provider may suggest family therapy, couples therapy, or individual therapy as part of your treatment. How to recognize changes Everyone responds differently to treatment for depression. As you recover from depression, you may start to:  Have more interest in doing activities.  Feel less hopeless.  Have more energy.  Overeat less often, or have a better appetite.  Have better mental focus. It is important to recognize if your depression is not getting better or is getting worse. The symptoms you had in the beginning may return, such as:  Tiredness (fatigue) or low energy.  Eating too much or too little.  Sleeping too much or too little.  Feeling restless, agitated, or hopeless.  Trouble focusing or making  decisions.  Unexplained physical complaints.  Feeling irritable, angry, or aggressive. If you or your family members notice these symptoms coming back, let your health care provider know right away. Follow these instructions at home: Activity  Try to get some form of exercise each day, such as walking, biking, swimming, or lifting weights.  Practice stress reduction techniques.  Engage your mind by taking a class or doing some volunteer work.   Lifestyle  Get the right amount and quality of sleep.  Cut down on using caffeine, tobacco, alcohol, and other potentially harmful substances.  Eat a healthy diet that includes plenty of vegetables, fruits, whole grains, low-fat dairy products, and lean protein. Do not eat a lot of foods that are high in solid fats, added sugars, or salt (sodium). General instructions  Take over-the-counter and prescription medicines only as told by your health care provider.  Keep all follow-up visits as told by your health care provider. This is important. Where to find support Talking to others Friends and family members can be sources of support and guidance. Talk to trusted friends or family members about your condition. Explain your symptoms to them, and let them know that you are working with a health care provider to treat your depression. Tell friends and family members how they also can be helpful.   Finances  Find appropriate mental health providers that fit with your financial situation.  Talk with your health care provider about options to get reduced prices on your medicines. Where to find more information You can find support in your area from:  Anxiety and Depression Association of America (ADAA): www.adaa.org  Mental Health America: www.mentalhealthamerica.net  [employer] on Mental Illness: www.nami.org Contact a health care provider if:  You stop taking your antidepressant medicines, and you have any of these  symptoms: ? Nausea. ? Headache. ? Light-headedness. ? Chills and body aches. ? Not being able to sleep (insomnia).  You or your friends and family think your depression is getting worse. Get help right away if:  You have thoughts of hurting yourself or others. If you ever feel like you may hurt yourself or others, or have thoughts about taking your own life, get help right away. Go  to your nearest emergency department or:  Call your local emergency services (911 in the [place]  Call a suicide crisis helpline, such as the [facility] at (812)043-4085. This is open 24 hours a day in the [place]  Text the Crisis Text Line at (515)497-7374 (in the [place] Summary  If you are diagnosed with depression, preparing yourself to manage your symptoms is a good way to feel positive about your future.  Work with your health care provider on a management plan that includes stress reduction techniques, medicines (if applicable), therapy, and healthy lifestyle habits.  Keep talking with your health care provider about how your treatment is working.  If you have thoughts about taking your own life, call a suicide crisis helpline or text a crisis text line. This information is not intended to replace advice given to you by your health care provider. Make sure you discuss any questions you have with your health care provider. Document Revised: 03/29/2019 Document Reviewed: 03/29/2019 Elsevier Patient Education  2021 [employer].

## 2020-10-10 ENCOUNTER — Telehealth: Payer: Self-pay | Admitting: Family Medicine

## 2020-10-10 NOTE — Telephone Encounter (Signed)
Chalmers Guest, NP  Vicente Males, LPN Lavella Lemons,   Please call Emony to make sure she started her blood pressure medicine and if she has had a chance to recheck her BP?   Thanks, KD   Pt contacted and states that she did get her BP meds and has taken 2 doses but has not checked BP. Pt is going to stop by her moms tomorrow and check blood pressure and call in reading to office.

## 2020-10-11 ENCOUNTER — Telehealth: Payer: Self-pay | Admitting: Family Medicine

## 2020-10-11 NOTE — Telephone Encounter (Signed)
Patient was told to call in to give a blood pressure reading today. 4:00 it was 178/94 by Santiago Glad

## 2020-10-13 ENCOUNTER — Other Ambulatory Visit: Payer: Self-pay | Admitting: Family Medicine

## 2020-10-14 NOTE — Telephone Encounter (Signed)
Pt contacted and verbalized understanding.  

## 2020-10-16 ENCOUNTER — Encounter: Payer: Self-pay | Admitting: Family Medicine

## 2020-10-16 ENCOUNTER — Ambulatory Visit (INDEPENDENT_AMBULATORY_CARE_PROVIDER_SITE_OTHER): Payer: Federal, State, Local not specified - PPO | Admitting: Family Medicine

## 2020-10-16 ENCOUNTER — Other Ambulatory Visit: Payer: Self-pay

## 2020-10-16 VITALS — BP 138/86 | HR 86 | Temp 97.9°F | Ht 63.0 in | Wt 223.4 lb

## 2020-10-16 DIAGNOSIS — I10 Essential (primary) hypertension: Secondary | ICD-10-CM | POA: Diagnosis not present

## 2020-10-16 MED ORDER — AMLODIPINE BESYLATE 10 MG PO TABS: 10.0000 mg | ORAL_TABLET | Freq: Every day | ORAL | 0 refills | Status: DC

## 2020-10-16 NOTE — Patient Instructions (Signed)
Hypotension As your heart beats, it forces blood through your body. This force is called blood pressure. If you have hypotension, you have low blood pressure. When your blood pressure is too low, you may not get enough blood to your brain or other parts of your body. This may cause you to feel weak, light-headed, have a fast heartbeat, or even pass out (faint). Low blood pressure may be harmless, or it may cause serious problems. What are the causes?  Blood loss.  Not enough water in the body (dehydration).  Heart problems.  Hormone problems.  Pregnancy.  A very bad infection.  Not having enough of certain nutrients.  Very bad allergic reactions.  Certain medicines. What increases the risk?  Age. The risk increases as you get older.  Conditions that affect the heart or the brain and spinal cord (central nervous system).  Taking certain medicines.  Being pregnant. What are the signs or symptoms?  Feeling: ? Weak. ? Light-headed. ? Dizzy. ? Tired (fatigued).  Blurred vision.  Fast heartbeat.  Passing out, in very bad cases. How is this treated?  Changing your diet. This may involve eating more salt (sodium) or drinking more water.  Taking medicines to raise your blood pressure.  Changing how much you take (the dosage) of some of your medicines.  Wearing compression stockings. These stockings help to prevent blood clots and reduce swelling in your legs. In some cases, you may need to go to the hospital for:  Fluid replacement. This means you will receive fluids through an IV tube.  Blood replacement. This means you will receive donated blood through an IV tube (transfusion).  Treating an infection or heart problems, if this applies.  Monitoring. You may need to be monitored while medicines that you are taking wear off. Follow these instructions at home: Eating and drinking  Drink enough fluids to keep your pee (urine) pale yellow.  Eat a healthy diet.  Follow instructions from your doctor about what you can eat or drink. A healthy diet includes: ? Fresh fruits and vegetables. ? Whole grains. ? Low-fat (lean) meats. ? Low-fat dairy products.  Eat extra salt only as told. Do not add extra salt to your diet unless your doctor tells you to.  Eat small meals often.  Avoid standing up quickly after you eat.   Medicines  Take over-the-counter and prescription medicines only as told by your doctor. ? Follow instructions from your doctor about changing how much you take of your medicines, if this applies. ? Do not stop or change any of your medicines on your own. General instructions  Wear compression stockings as told by your doctor.  Get up slowly from lying down or sitting.  Avoid hot showers and a lot of heat as told by your doctor.  Return to your normal activities as told by your doctor. Ask what activities are safe for you.  Do not use any products that contain nicotine or tobacco, such as cigarettes, e-cigarettes, and chewing tobacco. If you need help quitting, ask your doctor.  Keep all follow-up visits as told by your doctor. This is important.   Contact a doctor if:  You throw up (vomit).  You have watery poop (diarrhea).  You have a fever for more than 2-3 days.  You feel more thirsty than normal.  You feel weak and tired. Get help right away if:  You have chest pain.  You have a fast or uneven heartbeat.  You lose feeling (have numbness)   in any part of your body.  You cannot move your arms or your legs.  You have trouble talking.  You get sweaty or feel light-headed.  You pass out.  You have trouble breathing.  You have trouble staying awake.  You feel mixed up (confused). Summary  Hypotension is also called low blood pressure. It is when the force of blood pumping through your arteries is too weak.  Hypotension may be harmless, or it may cause serious problems.  Treatment may include changing  your diet and medicines, and wearing compression stockings.  In very bad cases, you may need to go to the hospital. This information is not intended to replace advice given to you by your health care provider. Make sure you discuss any questions you have with your health care provider. Document Revised: 11/11/2017 Document Reviewed: 11/11/2017 Elsevier Patient Education  2021 Elsevier Inc.  

## 2020-10-16 NOTE — Progress Notes (Signed)
Patient ID: [name], female    DOB: 1966/01/05, 55 y.o.   MRN: [id number]   Chief Complaint  Patient presents with  . Hypertension    Follow up   Subjective:  CC: follow-up for HTN   This is not a new problem.  Presents today for follow-up for hypertension.  On May 15, her amlodipine dose was increased to 10 mg.  She is on day 4 of this increased dose.  She does not have a blood pressure cuff at home, stopped by her mother's yesterday, blood pressure was 151/80.  Headache has improved since being on blood pressure medicines.  See review of systems.    Medical History Kendall has a past medical history of Acute pericarditis (05/10/2016), ADD (attention deficit disorder), Bipolar 1 disorder ([place]), CHF (congestive heart failure) ([place]), COPD (chronic obstructive pulmonary disease) ([place]), Depression, GERD (gastroesophageal reflux disease), Hypertension, IFG (impaired fasting glucose), and Substance abuse ([place]).   Outpatient Encounter Medications as of 10/16/2020  Medication Sig  . acetaminophen (TYLENOL) 500 MG tablet Take 1,500 mg by mouth every 6 (six) hours as needed.  Marland Kitchen albuterol (VENTOLIN HFA) 108 (90 Base) MCG/ACT inhaler INHALE 2 PUFFS INTO THE LUNGS EVERY 6 HOURS AS NEEDED FOR WHEEZING OR SHORTNESS OF BREATH  . ALPRAZolam (XANAX) 0.25 MG tablet Take one tablet by mouth daily as needed for severe anxiety.  Marland Kitchen amLODipine (NORVASC) 10 MG tablet Take 1 tablet (10 mg total) by mouth daily.  Marland Kitchen aspirin EC 81 MG tablet Take 1 tablet (81 mg total) by mouth daily. Swallow whole.  . budesonide-formoterol (SYMBICORT) 80-4.5 MCG/ACT inhaler INHALE 2 PUFFS BY MOUTH TWICE DAILY (Patient not taking: No sig reported)  . escitalopram (LEXAPRO) 20 MG tablet Take 1 tablet (20 mg total) by mouth daily.  . rosuvastatin (CRESTOR) 10 MG tablet Take 1 tablet (10 mg total) by mouth daily with supper.  Marland Kitchen SPIRIVA HANDIHALER 18 MCG inhalation capsule INHALE THE CONTENTS OF 1 CAPSULE VIA INHALATION  DEVICE EVERY DAY  . [DISCONTINUED] amLODipine (NORVASC) 5 MG tablet TAKE 1 TABLET(5 MG) BY MOUTH DAILY   No facility-administered encounter medications on file as of 10/16/2020.     Review of Systems  Constitutional: Negative for chills and fever.  Respiratory: Negative for shortness of breath.   Cardiovascular: Negative for chest pain.  Neurological: Negative for headaches.     Vitals BP 138/86   Pulse 86   Temp 97.9 F (36.6 C) (Oral)   Ht 5\' 3"  (1.6 m)   Wt 223 lb 6.4 oz (101.3 kg)   LMP 03/18/2013 (Approximate)   SpO2 98%   BMI 39.57 kg/m   Objective:   Physical Exam Vitals reviewed.  Constitutional:      Appearance: Normal appearance.  Cardiovascular:     Rate and Rhythm: Normal rate and regular rhythm.     Heart sounds: Normal heart sounds.  Pulmonary:     Effort: Pulmonary effort is normal.     Breath sounds: Normal breath sounds.  Abdominal:     General: Bowel sounds are normal.  Skin:    General: Skin is warm and dry.  Neurological:     General: No focal deficit present.     Mental Status: She is alert.  Psychiatric:        Behavior: Behavior normal.      Assessment and Plan   1. Benign essential HTN - amLODipine (NORVASC) 10 MG tablet; Take 1 tablet (10 mg total) by mouth daily.  Dispense: 90  tablet; Refill: 0   Day 4 of amlodipine 10 mg, with blood pressures improving.  Prescription given today for blood pressure monitoring machine.  She is instructed to measure her blood pressure a few times per week, keep a log, and bring this information at her follow-up appointment.  Agrees with plan of care discussed today. Understands warning signs to seek further care: chest pain, shortness of breath, any significant change in health.  Understands to follow-up in 3 months for blood pressure, with blood pressure log, sooner if pressures are not staying controlled.  Reiterated importance of a sodium restricted diet, information given at discharge.  Pecolia Ades, NP 10/16/20

## 2020-10-17 ENCOUNTER — Ambulatory Visit (INDEPENDENT_AMBULATORY_CARE_PROVIDER_SITE_OTHER): Payer: Federal, State, Local not specified - PPO | Admitting: Clinical

## 2020-10-17 DIAGNOSIS — F419 Anxiety disorder, unspecified: Secondary | ICD-10-CM | POA: Diagnosis not present

## 2020-10-17 DIAGNOSIS — F331 Major depressive disorder, recurrent, moderate: Secondary | ICD-10-CM

## 2020-10-17 NOTE — Progress Notes (Addendum)
Virtual Visit via Video Note   I connected with [name] on 10/17/20 at  10:00 AM EDT by a video enabled telemedicine application and verified that I am speaking with the correct person using two identifiers.   Location: Patient: Home Provider: Office   I discussed the limitations of evaluation and management by telemedicine and the availability of in person appointments. The patient expressed understanding and agreed to proceed.       THERAPIST PROGRESS NOTE   Session Time: 10:00AM-10:40AM   Participation Level: Active   Behavioral Response: CasualAlertAnxious   Type of Therapy: Individual Therapy   Treatment Goals addressed: Anger and Coping   Interventions: CBT, Motivational Interviewing, Solution Focused and Strength-based   Summary: [name] is a 55 y.o. female who presents with GAD and ADHD. The OPT therapist worked with the patient for her intital OPT treatment. The OPT therapist utilized Motivational Interviewing to assist in creating therapeutic repore. The patient in the session was engaged and work in collaboration giving feedback about her triggers and symptoms over the past few weeks. The patient noted improvements from recent Medication Therapy, improved interaction with her boss, and functional/phyiscal health improvement.The OPT therapist utilized Cognitive Behavioral Therapy through cognitive restructuring as well as worked with the patient on coping strategies to assist in management of Anxiety. The OPT therapist worked in the session with the patient on challenging negative thoughts and implementing positive thinking and working within her limits and continuing to set healthy boundaries.    Suicidal/Homicidal: Nowithout intent/plan   Therapist Response: The OPT therapist worked with the patient for the patients scheduled session. The patient was engaged in her session and gave feedback in relation to triggers, symptoms, and behavior responses  over the past few weeks. The OPT therapist worked with the patient utilizing an in session Cognitive Behavioral Therapy exercise. The patient was responsive in the session and verbalized, " I realized I have been hurting others and reacting when I am mad in the moment ". The OPT theapist worked with the patient in changing her pattern ,, becoming more self aware of her mood, and building in a pause and walk away button.The OPT therapist reviewed the importance of not continuing to be consistent with medication therapy and all recommendations and scheduled health appointments.   Plan: Return again in 2/3 weeks.   Diagnosis:      Axis I: Depression w Anxiety                           Axis II: No diagnosis   I discussed the assessment and treatment plan with the patient. The patient was provided an opportunity to ask questions and all were answered. The patient agreed with the plan and demonstrated an understanding of the instructions.   The patient was advised to call back or seek an in-person evaluation if the symptoms worsen or if the condition fails to improve as anticipated.   I provided 40 minutes of non-face-to-face time during this encounter.   Raechelle Sarti T. Eulas Post, [place]  10/17/2020

## 2020-10-30 ENCOUNTER — Other Ambulatory Visit: Payer: Self-pay | Admitting: Family Medicine

## 2020-10-30 DIAGNOSIS — F411 Generalized anxiety disorder: Secondary | ICD-10-CM

## 2020-10-30 DIAGNOSIS — F322 Major depressive disorder, single episode, severe without psychotic features: Secondary | ICD-10-CM

## 2020-11-05 ENCOUNTER — Other Ambulatory Visit: Payer: Self-pay | Admitting: Family Medicine

## 2020-11-06 ENCOUNTER — Other Ambulatory Visit: Payer: Self-pay | Admitting: Family Medicine

## 2020-11-07 ENCOUNTER — Ambulatory Visit (HOSPITAL_COMMUNITY): Payer: Federal, State, Local not specified - PPO | Admitting: Clinical

## 2020-11-07 ENCOUNTER — Other Ambulatory Visit: Payer: Self-pay

## 2020-11-20 ENCOUNTER — Ambulatory Visit (HOSPITAL_COMMUNITY): Payer: Federal, State, Local not specified - PPO | Admitting: Clinical

## 2020-11-28 ENCOUNTER — Other Ambulatory Visit: Payer: Self-pay

## 2020-11-28 ENCOUNTER — Ambulatory Visit (INDEPENDENT_AMBULATORY_CARE_PROVIDER_SITE_OTHER): Payer: Federal, State, Local not specified - PPO | Admitting: Clinical

## 2020-11-28 DIAGNOSIS — F331 Major depressive disorder, recurrent, moderate: Secondary | ICD-10-CM | POA: Diagnosis not present

## 2020-11-28 DIAGNOSIS — F419 Anxiety disorder, unspecified: Secondary | ICD-10-CM

## 2020-11-28 NOTE — Progress Notes (Addendum)
Virtual Visit via Video Note  I connected with [name] on 11/28/20 at  9:00 AM EDT by a video enabled telemedicine application and verified that I am speaking with the correct person using two identifiers.  Location: Patient: Home Provider: Office   I discussed the limitations of evaluation and management by telemedicine and the availability of in person appointments. The patient expressed understanding and agreed to proceed.   THERAPIST PROGRESS NOTE   Session Time: 9:00AM-9:45AM   Participation Level: Active   Behavioral Response: CasualAlertAnxious   Type of Therapy: Individual Therapy   Treatment Goals addressed: Anger and Coping   Interventions: CBT, Motivational Interviewing, Solution Focused and Strength-based   Summary: [name] is a 55 y.o. female who presents with GAD and ADHD. The OPT therapist worked with the patient for her intital OPT treatment. The OPT therapist utilized Motivational Interviewing to assist in creating therapeutic repore. The patient in the session was engaged and work in collaboration giving feedback about her triggers and symptoms over the past few weeks. The patient spoke about getting a new puppy and restarting a friendship with a old co-worker.The OPT therapist utilized Cognitive Behavioral Therapy through cognitive restructuring as well as worked with the patient on coping strategies to assist in management of Anxiety. The OPT therapist worked in the session with the patient on challenging negative thoughts and implementing positive thinking and working within her limits and continuing to set healthy boundaries. The patient worked with the [place] therapist on time management and leisure in having a good work/life balance.   Suicidal/Homicidal: Nowithout intent/plan   Therapist Response: The OPT therapist worked with the patient for the patients scheduled session. The patient was engaged in her session and gave feedback in  relation to triggers, symptoms, and behavior responses over the past few weeks. The OPT therapist worked with the patient utilizing an in session Cognitive Behavioral Therapy exercise. The patient was responsive in the session and verbalized, " I am more aware and I have been trying to let go of something's especially with my work and the way I have been approaching how I think about it ".The OPT therapist worked with the patient on balancing her work/life and implementing leisure along with a more positive thought lens.The OPT therapist reviewed the importance of  continuing to be consistent with medication therapy and all recommendations and scheduled health appointments.   Plan: Return again in 2/3 weeks.   Diagnosis:      Axis I: Depression w Anxiety                           Axis II: No diagnosis   I discussed the assessment and treatment plan with the patient. The patient was provided an opportunity to ask questions and all were answered. The patient agreed with the plan and demonstrated an understanding of the instructions.   The patient was advised to call back or seek an in-person evaluation if the symptoms worsen or if the condition fails to improve as anticipated.   I provided 45 minutes of non-face-to-face time during this encounter.   Olamide Carattini T. Eulas Post, [place]   11/28/2020

## 2020-12-03 ENCOUNTER — Other Ambulatory Visit: Payer: Self-pay | Admitting: Family Medicine

## 2020-12-03 DIAGNOSIS — F322 Major depressive disorder, single episode, severe without psychotic features: Secondary | ICD-10-CM

## 2020-12-03 DIAGNOSIS — F411 Generalized anxiety disorder: Secondary | ICD-10-CM

## 2020-12-05 ENCOUNTER — Other Ambulatory Visit: Payer: Self-pay | Admitting: Family Medicine

## 2020-12-18 ENCOUNTER — Other Ambulatory Visit: Payer: Self-pay

## 2020-12-18 ENCOUNTER — Ambulatory Visit (INDEPENDENT_AMBULATORY_CARE_PROVIDER_SITE_OTHER): Payer: Federal, State, Local not specified - PPO | Admitting: Clinical

## 2020-12-18 DIAGNOSIS — F419 Anxiety disorder, unspecified: Secondary | ICD-10-CM

## 2020-12-18 DIAGNOSIS — F331 Major depressive disorder, recurrent, moderate: Secondary | ICD-10-CM

## 2020-12-18 NOTE — Progress Notes (Addendum)
Virtual Visit via Video Note   I connected with [name] on 12/18/20 at  9:00 AM EDT by a video enabled telemedicine application and verified that I am speaking with the correct person using two identifiers.   Location: Patient: Home Provider: Office   I discussed the limitations of evaluation and management by telemedicine and the availability of in person appointments. The patient expressed understanding and agreed to proceed.     THERAPIST PROGRESS NOTE   Session Time: 9:00AM-9:45AM   Participation Level: Active   Behavioral Response: CasualAlertAnxious   Type of Therapy: Individual Therapy   Treatment Goals addressed: Anger and Coping   Interventions: CBT, Motivational Interviewing, Solution Focused and Strength-based   Summary: [name] is a 55 y.o. female who presents with GAD and ADHD. The OPT therapist worked with the patient for her intital OPT treatment. The OPT therapist utilized Motivational Interviewing to assist in creating therapeutic repore. The patient in the session was engaged and work in collaboration giving feedback about her triggers and symptoms over the past few weeks. The patient spoke about changing her mentality to things will work out I am not going to worry and this has been helpful instead of trying to fight everything that went against her work.The OPT therapist utilized Cognitive Behavioral Therapy through cognitive restructuring as well as worked with the patient on coping strategies to assist in management of Anxiety. The OPT therapist worked in the session with the patient on challenging negative thoughts and implementing positive thinking and working within her limits and continuing to set healthy boundaries. The patient worked with the [place] therapist on time management and leisure in having a good work/life balance.   Suicidal/Homicidal: Nowithout intent/plan   Therapist Response: The OPT therapist worked with the patient for  the patients scheduled session. The patient was engaged in her session and gave feedback in relation to triggers, symptoms, and behavior responses over the past few weeks. The OPT therapist worked with the patient utilizing an in session Cognitive Behavioral Therapy exercise. The patient was responsive in the session and verbalized, " I think with my work I was making things more hostile for myself and I have changed my thinking and its much better ".The OPT therapist worked with the patient on balancing her work/life and implementing leisure along with a more positive thought lens.The OPT therapist reviewed the importance of  continuing to be consistent with medication therapy and all recommendations and scheduled health appointments.   Plan: Return again in 2/3 weeks.   Diagnosis:      Axis I: Depression w Anxiety                           Axis II: No diagnosis   I discussed the assessment and treatment plan with the patient. The patient was provided an opportunity to ask questions and all were answered. The patient agreed with the plan and demonstrated an understanding of the instructions.   The patient was advised to call back or seek an in-person evaluation if the symptoms worsen or if the condition fails to improve as anticipated.   I provided 45 minutes of non-face-to-face time during this encounter.   Rahkim Rabalais T. Eulas Post, [place]   12/18/2020

## 2021-01-02 ENCOUNTER — Other Ambulatory Visit: Payer: Self-pay | Admitting: Family Medicine

## 2021-01-02 DIAGNOSIS — R030 Elevated blood-pressure reading, without diagnosis of hypertension: Secondary | ICD-10-CM

## 2021-01-04 ENCOUNTER — Other Ambulatory Visit: Payer: Self-pay | Admitting: Family Medicine

## 2021-01-08 ENCOUNTER — Ambulatory Visit (INDEPENDENT_AMBULATORY_CARE_PROVIDER_SITE_OTHER): Payer: Federal, State, Local not specified - PPO | Admitting: Clinical

## 2021-01-08 ENCOUNTER — Other Ambulatory Visit: Payer: Self-pay

## 2021-01-08 DIAGNOSIS — F331 Major depressive disorder, recurrent, moderate: Secondary | ICD-10-CM | POA: Diagnosis not present

## 2021-01-08 DIAGNOSIS — F419 Anxiety disorder, unspecified: Secondary | ICD-10-CM

## 2021-01-08 NOTE — Progress Notes (Addendum)
Virtual Visit via Video Note   I connected with [name] on 01/08/21 at  9:00 AM EDT by a video enabled telemedicine application and verified that I am speaking with the correct person using two identifiers.   Location: Patient: Home Provider: Office   I discussed the limitations of evaluation and management by telemedicine and the availability of in person appointments. The patient expressed understanding and agreed to proceed.     THERAPIST PROGRESS NOTE   Session Time: 9:00AM-9:30AM   Participation Level: Active   Behavioral Response: CasualAlertAnxious   Type of Therapy: Individual Therapy   Treatment Goals addressed: Anger and Coping   Interventions: CBT, Motivational Interviewing, Solution Focused and Strength-based   Summary: [name] is a 55 y.o. female who presents with GAD and ADHD. The OPT therapist worked with the patient for her intital OPT treatment. The OPT therapist utilized Motivational Interviewing to assist in creating therapeutic repore. The patient in the session was engaged and work in collaboration giving feedback about her triggers and symptoms over the past few weeks. The patient spoke about difficulty as she has started to return to dating. The OPT therapist utilized Cognitive Behavioral Therapy through cognitive restructuring as well as worked with the patient on coping strategies to assist in management of Anxiety. The OPT therapist worked in the session with the patient on challenging negative thoughts and implementing positive thinking and working within her limits and continuing to set healthy boundaries. The patient worked with the [place] therapist on time management and leisure in having a good work/life balance. The OPT therapist worked with the patient on not Catastrophizing.   Suicidal/Homicidal: Nowithout intent/plan   Therapist Response: The OPT therapist worked with the patient for the patients scheduled session. The patient  was engaged in her session and gave feedback in relation to triggers, symptoms, and behavior responses over the past few weeks. The OPT therapist worked with the patient utilizing an in session Cognitive Behavioral Therapy exercise. The patient was responsive in the session and verbalized, " I had today off and work still called me because someone dropped the ball and didn't put it on the schedule even though it was an approved absence". The patient spoke about the totality of smaller triggers on her overall.The OPT therapist worked with the patient on balancing her work/life and implementing leisure along with a more positive thought lens.The OPT therapist reviewed the importance of continuing to be consistent with medication therapy and all recommendations and scheduled health appointments.   Plan: Return again in 2/3 weeks.   Diagnosis:      Axis I: Depression w Anxiety                           Axis II: No diagnosis   I discussed the assessment and treatment plan with the patient. The patient was provided an opportunity to ask questions and all were answered. The patient agreed with the plan and demonstrated an understanding of the instructions.   The patient was advised to call back or seek an in-person evaluation if the symptoms worsen or if the condition fails to improve as anticipated.   I provided 30 minutes of non-face-to-face time during this encounter.   Akili Corsetti T. Eulas Post, [place]   01/08/2021

## 2021-01-16 ENCOUNTER — Other Ambulatory Visit: Payer: Self-pay

## 2021-01-16 ENCOUNTER — Ambulatory Visit: Payer: Federal, State, Local not specified - PPO | Admitting: Family Medicine

## 2021-01-16 VITALS — BP 140/78 | HR 79 | Ht 61.0 in | Wt 225.2 lb

## 2021-01-16 DIAGNOSIS — Z1211 Encounter for screening for malignant neoplasm of colon: Secondary | ICD-10-CM

## 2021-01-16 DIAGNOSIS — Z79899 Other long term (current) drug therapy: Secondary | ICD-10-CM

## 2021-01-16 DIAGNOSIS — F411 Generalized anxiety disorder: Secondary | ICD-10-CM

## 2021-01-16 DIAGNOSIS — E785 Hyperlipidemia, unspecified: Secondary | ICD-10-CM | POA: Diagnosis not present

## 2021-01-16 DIAGNOSIS — I10 Essential (primary) hypertension: Secondary | ICD-10-CM

## 2021-01-16 MED ORDER — ALBUTEROL SULFATE HFA 108 (90 BASE) MCG/ACT IN AERS: 2.0000 | INHALATION_SPRAY | Freq: Four times a day (QID) | RESPIRATORY_TRACT | 5 refills | Status: DC | PRN

## 2021-01-16 MED ORDER — AMLODIPINE BESYLATE 10 MG PO TABS: 10.0000 mg | ORAL_TABLET | Freq: Every day | ORAL | 1 refills | Status: DC

## 2021-01-16 MED ORDER — SPIRIVA HANDIHALER 18 MCG IN CAPS: ORAL_CAPSULE | RESPIRATORY_TRACT | 5 refills | Status: DC

## 2021-01-16 MED ORDER — BUDESONIDE-FORMOTEROL FUMARATE 80-4.5 MCG/ACT IN AERO: INHALATION_SPRAY | RESPIRATORY_TRACT | 5 refills | Status: DC

## 2021-01-16 NOTE — Progress Notes (Signed)
   Subjective:    Patient ID: [name], female    DOB: 30-Apr-1966, 55 y.o.   MRN: [id number]  Hypertension This is a chronic problem. The current episode started more than 1 year ago. Risk factors for coronary artery disease include dyslipidemia. Treatments tried: norvasc. There are no compliance problems.    Patient states the lexapro has helped but she is still struggling with anxiety  Benign essential HTN - Plan: Lipid panel, Hepatic function panel, Basic metabolic panel, amLODipine (NORVASC) 10 MG tablet  GAD (generalized anxiety disorder)  Hyperlipidemia, unspecified hyperlipidemia type - Plan: Lipid panel, Hepatic function panel  Screening for malignant neoplasm of colon - Plan: Cologuard  High risk medication use - Plan: Hepatic function panel, Basic metabolic panel   Review of Systems     Objective:   Physical Exam  General-in no acute distress Eyes-no discharge Lungs-respiratory rate normal, CTA CV-no murmurs,RRR Extremities skin warm dry no edema Neuro grossly normal Behavior normal, alert       Assessment & Plan:  1. Benign essential HTN Blood pressure decent control.  Healthy diet recommended.  Follow-up if progressive troubles.  Recheck if any issues. - Lipid panel - Hepatic function panel - Basic metabolic panel  2. GAD (generalized anxiety disorder) Anxiety she states it is getting worse stressed at work able to control things uses a Lexapro currently we did discuss adding Wellbutrin we also discussed consultation with psychiatry she would like to go ahead with consultation with psychiatry  3. Hyperlipidemia, unspecified hyperlipidemia type Continue medication check lab work await the results - Lipid panel - Hepatic function panel  4. Screening for malignant neoplasm of colon After discussion of pluses and minuses patient chooses Cologuard - Cologuard  5. High risk medication use Lab work ordered - Hepatic function panel - Basic  metabolic panel  COPD-stable continue current medication refills given patient encouraged to avoid smoking  Patient advised to get mammogram she defers currently  Follow-up 6 months

## 2021-01-30 ENCOUNTER — Ambulatory Visit (INDEPENDENT_AMBULATORY_CARE_PROVIDER_SITE_OTHER): Payer: Federal, State, Local not specified - PPO | Admitting: Clinical

## 2021-01-30 ENCOUNTER — Other Ambulatory Visit: Payer: Self-pay

## 2021-01-30 DIAGNOSIS — F331 Major depressive disorder, recurrent, moderate: Secondary | ICD-10-CM | POA: Diagnosis not present

## 2021-01-30 DIAGNOSIS — F419 Anxiety disorder, unspecified: Secondary | ICD-10-CM

## 2021-01-30 NOTE — Progress Notes (Addendum)
Virtual Visit via Video Note   I connected with [name] on 01/30/21 at  9:00 AM EDT by a video enabled telemedicine application and verified that I am speaking with the correct person using two identifiers.   Location: Patient: Home Provider: Office   I discussed the limitations of evaluation and management by telemedicine and the availability of in person appointments. The patient expressed understanding and agreed to proceed.     THERAPIST PROGRESS NOTE   Session Time: 9:00AM-9:45AM   Participation Level: Active   Behavioral Response: CasualAlertAnxious   Type of Therapy: Individual Therapy   Treatment Goals addressed: Anger and Coping   Interventions: CBT, Motivational Interviewing, Solution Focused and Strength-based   Summary: [name] is a 55 y.o. female who presents with GAD and ADHD. The OPT therapist worked with the patient for her intital OPT treatment. The OPT therapist utilized Motivational Interviewing to assist in creating therapeutic repore. The patient in the session was engaged and work in collaboration giving feedback about her triggers and symptoms over the past few weeks. The patient spoke about feeling like she is making some progress in her emotion control and making some head way in her dating. The OPT therapist utilized Cognitive Behavioral Therapy through cognitive restructuring as well as worked with the patient on coping strategies to assist in management of Anxiety. The OPT therapist worked in the session with the patient on challenging negative thoughts and implementing positive thinking and working within her limits and continuing to set healthy boundaries. The patient worked with the [place] therapist on time management and leisure in having a good work/life balance. The OPT therapist worked with the patient on not Catastrophizing and changing her perspective about what having control means.   Suicidal/Homicidal: Nowithout intent/plan    Therapist Response: The OPT therapist worked with the patient for the patients scheduled session. The patient was engaged in her session and gave feedback in relation to triggers, symptoms, and behavior responses over the past few weeks. The OPT therapist worked with the patient utilizing an in session Cognitive Behavioral Therapy exercise. The patient was responsive in the session and verbalized, " I haven't thought about it before like this but your right when I responded aggressively before I thought that was control but it wasn't and keeping my composure is control". The OPT therapist worked with the patient on balancing her work/life and implementing leisure along with a more positive thought lens.The OPT therapist reviewed the importance of continuing to be consistent with medication therapy and all recommendations and scheduled health appointments.   Plan: Return again in 2/3 weeks.   Diagnosis:      Axis I: Depression w Anxiety                           Axis II: No diagnosis   I discussed the assessment and treatment plan with the patient. The patient was provided an opportunity to ask questions and all were answered. The patient agreed with the plan and demonstrated an understanding of the instructions.   The patient was advised to call back or seek an in-person evaluation if the symptoms worsen or if the condition fails to improve as anticipated.   I provided 45 minutes of non-face-to-face time during this encounter.   [name]   01/30/2021

## 2021-03-06 ENCOUNTER — Other Ambulatory Visit: Payer: Self-pay

## 2021-03-06 ENCOUNTER — Ambulatory Visit (INDEPENDENT_AMBULATORY_CARE_PROVIDER_SITE_OTHER): Payer: Federal, State, Local not specified - PPO | Admitting: Clinical

## 2021-03-06 DIAGNOSIS — F331 Major depressive disorder, recurrent, moderate: Secondary | ICD-10-CM

## 2021-03-06 DIAGNOSIS — F419 Anxiety disorder, unspecified: Secondary | ICD-10-CM | POA: Diagnosis not present
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
## 2021-03-19 ENCOUNTER — Encounter: Payer: Self-pay | Admitting: Family Medicine

## 2021-03-27 ENCOUNTER — Ambulatory Visit (INDEPENDENT_AMBULATORY_CARE_PROVIDER_SITE_OTHER): Payer: Federal, State, Local not specified - PPO | Admitting: Clinical

## 2021-03-27 ENCOUNTER — Other Ambulatory Visit: Payer: Self-pay

## 2021-03-27 DIAGNOSIS — F419 Anxiety disorder, unspecified: Secondary | ICD-10-CM | POA: Diagnosis not present

## 2021-03-27 DIAGNOSIS — F331 Major depressive disorder, recurrent, moderate: Secondary | ICD-10-CM
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
## 2021-04-14 ENCOUNTER — Telehealth: Payer: Self-pay | Admitting: Family Medicine

## 2021-04-14 ENCOUNTER — Other Ambulatory Visit: Payer: Self-pay | Admitting: Family Medicine

## 2021-04-14 DIAGNOSIS — F322 Major depressive disorder, single episode, severe without psychotic features: Secondary | ICD-10-CM

## 2021-04-14 DIAGNOSIS — F411 Generalized anxiety disorder: Secondary | ICD-10-CM

## 2021-04-14 MED ORDER — ESCITALOPRAM OXALATE 20 MG PO TABS: ORAL_TABLET | ORAL | 3 refills | Status: DC

## 2021-04-14 NOTE — Telephone Encounter (Signed)
[facility] [address] requesting refill on Escitalopram 20 mg tablets. Take one tablet po daily. Pt last seen 01/16/21 for HTN. Please advise. Thank you

## 2021-04-14 NOTE — Telephone Encounter (Signed)
Refills was sent in please have patient follow-up in February as planned

## 2021-04-17 ENCOUNTER — Ambulatory Visit (INDEPENDENT_AMBULATORY_CARE_PROVIDER_SITE_OTHER): Payer: Federal, State, Local not specified - PPO | Admitting: Clinical

## 2021-04-17 ENCOUNTER — Other Ambulatory Visit: Payer: Self-pay

## 2021-04-17 DIAGNOSIS — F419 Anxiety disorder, unspecified: Secondary | ICD-10-CM

## 2021-04-17 DIAGNOSIS — F331 Major depressive disorder, recurrent, moderate: Secondary | ICD-10-CM
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
## 2021-05-12 ENCOUNTER — Other Ambulatory Visit: Payer: Self-pay | Admitting: Internal Medicine

## 2021-05-13 ENCOUNTER — Other Ambulatory Visit: Payer: Self-pay | Admitting: Internal Medicine

## 2021-05-13 NOTE — Telephone Encounter (Signed)
This is a [place] pt.  °

## 2021-05-15 ENCOUNTER — Ambulatory Visit (HOSPITAL_COMMUNITY): Payer: Federal, State, Local not specified - PPO | Admitting: Clinical

## 2021-05-15 ENCOUNTER — Telehealth (HOSPITAL_COMMUNITY): Payer: Self-pay | Admitting: Clinical

## 2021-05-15 ENCOUNTER — Other Ambulatory Visit: Payer: Self-pay

## 2021-05-15 NOTE — Telephone Encounter (Signed)
The patient did not respond to video link, phone call, or VM 

## 2021-06-17 ENCOUNTER — Ambulatory Visit (INDEPENDENT_AMBULATORY_CARE_PROVIDER_SITE_OTHER): Payer: Federal, State, Local not specified - PPO | Admitting: Clinical

## 2021-06-17 ENCOUNTER — Other Ambulatory Visit: Payer: Self-pay

## 2021-06-17 DIAGNOSIS — F331 Major depressive disorder, recurrent, moderate: Secondary | ICD-10-CM

## 2021-06-17 DIAGNOSIS — F419 Anxiety disorder, unspecified: Secondary | ICD-10-CM | POA: Diagnosis not present

## 2021-06-17 NOTE — Progress Notes (Signed)
Virtual Visit via Telephone Note  I connected with [name] on 06/17/21 at  2:00 PM EST by telephone and verified that I am speaking with the correct person using two identifiers.  Location: Patient: Home Provider: Office   I discussed the limitations, risks, security and privacy concerns of performing an evaluation and management service by telephone and the availability of in person appointments. I also discussed with the patient that there may be a patient responsible charge related to this service. The patient expressed understanding and agreed to proceed.  THERAPIST PROGRESS NOTE   Session Time: 2:00 PM-2:30 PM   Participation Level: Active   Behavioral Response: CasualAlertAnxious   Type of Therapy: Individual Therapy   Treatment Goals addressed: Anger and Coping   Interventions: CBT, Motivational Interviewing, Solution Focused and Strength-based   Summary: [name] is a 56 y.o. female who presents with GAD and ADHD. The OPT therapist worked with the patient for her ongoing OPT treatment. The OPT therapist utilized Motivational Interviewing to assist in creating therapeutic repore. The patient in the session was engaged and work in collaboration giving feedback about her triggers and symptoms over the past few weeks. The patient spoke about getting proposed to and agreeing and then her fiance after out of the blue left and the patient found out through his niece he has decided to leave cause he doesn't trust her.The patient found out later her fiance actually is now dating someone else. The OPT therapist encouraged the patient to see her medical provider to have her physical health symptoms checked out. The OPT therapist utilized Cognitive Behavioral Therapy through cognitive restructuring as well as worked with the patient on coping strategies to assist in management of Anxiety. The OPT therapist worked in the session with the patient on challenging negative  thoughts and implementing positive thinking and working within her limits and continuing to set healthy boundaries. The OPT therapist worked with the patient to not internalize the fail of the engagement on herself.   Suicidal/Homicidal: Nowithout intent/plan   Therapist Response: The OPT therapist worked with the patient for the patients scheduled session. The patient was engaged in her session and gave feedback in relation to triggers, symptoms, and behavior responses over the past few weeks. The OPT therapist worked with the patient utilizing an in session Cognitive Behavioral Therapy exercise. The patient was responsive in the session and verbalized, " I guess I just didn't see the red flags". The OPT therapist worked with the patient on balancing her work/life and implementing leisure along with a more positive thought lens. The patient spoke about being hurt from the experience but understanding this was not a reflection of a fault in her.The OPT therapist reviewed the importance of continuing to be consistent with medication therapy and all recommendations and scheduled health appointments.   Plan: Return again in 2/3 weeks.   Diagnosis:      Axis I: Depression w Anxiety                           Axis II: No diagnosis   I discussed the assessment and treatment plan with the patient. The patient was provided an opportunity to ask questions and all were answered. The patient agreed with the plan and demonstrated an understanding of the instructions.   The patient was advised to call back or seek an in-person evaluation if the symptoms worsen or if the condition fails to improve as anticipated.  I provided 30 minutes of non-face-to-face time during this encounter.   Arcenio Mullaly T. Eulas Post, LCSW   04/17/2022

## 2021-07-07 ENCOUNTER — Other Ambulatory Visit: Payer: Self-pay | Admitting: Family Medicine

## 2021-07-07 DIAGNOSIS — I10 Essential (primary) hypertension: Secondary | ICD-10-CM

## 2021-07-15 ENCOUNTER — Other Ambulatory Visit: Payer: Self-pay

## 2021-07-15 ENCOUNTER — Ambulatory Visit (INDEPENDENT_AMBULATORY_CARE_PROVIDER_SITE_OTHER): Payer: Federal, State, Local not specified - PPO | Admitting: Clinical

## 2021-07-15 DIAGNOSIS — F331 Major depressive disorder, recurrent, moderate: Secondary | ICD-10-CM | POA: Diagnosis not present

## 2021-07-15 DIAGNOSIS — F419 Anxiety disorder, unspecified: Secondary | ICD-10-CM

## 2021-07-15 NOTE — Progress Notes (Signed)
Virtual Visit via Telephone Note   I connected with [name] on 07/15/21 at  2:00 PM EST by telephone and verified that I am speaking with the correct person using two identifiers.   Location: Patient: Home Provider: Office   I discussed the limitations, risks, security and privacy concerns of performing an evaluation and management service by telephone and the availability of in person appointments. I also discussed with the patient that there may be a patient responsible charge related to this service. The patient expressed understanding and agreed to proceed.   THERAPIST PROGRESS NOTE   Session Time: 2:00 PM-2:30 PM   Participation Level: Active   Behavioral Response: CasualAlertAnxious   Type of Therapy: Individual Therapy   Treatment Goals addressed: Anger and Coping   Interventions: CBT, Motivational Interviewing, Solution Focused and Strength-based   Summary: [name] is a 56 y.o. female who presents with GAD and ADHD. The OPT therapist worked with the patient for her ongoing OPT treatment. The OPT therapist utilized Motivational Interviewing to assist in creating therapeutic repore. The patient in the session was engaged and work in collaboration giving feedback about her triggers and symptoms over the past few weeks. The patient spoke about trying to bounce back from a recent relationship breakup. The OPT therapist utilized Cognitive Behavioral Therapy through cognitive restructuring as well as worked with the patient on coping strategies to assist in management of Anxiety. The OPT therapist worked in the session with the patient on challenging negative thoughts and implementing positive thinking and working within her limits and continuing to set healthy boundaries.  The patient spoke about her goal of the day was working to clean up her home. The OPT therapist worked with the patient to not internalize the fail of the engagement on herself.    Suicidal/Homicidal: Nowithout intent/plan   Therapist Response: The OPT therapist worked with the patient for the patients scheduled session. The patient was engaged in her session and gave feedback in relation to triggers, symptoms, and behavior responses over the past few weeks. The OPT therapist worked with the patient utilizing an in session Cognitive Behavioral Therapy exercise. The patient was responsive in the session and verbalized, " I am just not open to or focused on dating right now but have had some people try, I want to just work on my goals and get some things for me accomplished". The OPT therapist worked with the patient on balancing her work/life and implementing leisure along with a more positive thought lens.The OPT therapist reviewed the importance of continuing to be consistent with medication therapy and all recommendations and scheduled health appointments.   Plan: Return again in 2/3 weeks.   Diagnosis:      Axis I: Depression w Anxiety                           Axis II: No diagnosis   I discussed the assessment and treatment plan with the patient. The patient was provided an opportunity to ask questions and all were answered. The patient agreed with the plan and demonstrated an understanding of the instructions.   The patient was advised to call back or seek an in-person evaluation if the symptoms worsen or if the condition fails to improve as anticipated.   I provided 30 minutes of non-face-to-face time during this encounter.   Fermin Yan T. Eulas Post, [place]   07/15/2021

## 2021-07-21 ENCOUNTER — Encounter: Payer: Self-pay | Admitting: Family Medicine

## 2021-07-21 ENCOUNTER — Ambulatory Visit: Payer: Federal, State, Local not specified - PPO | Admitting: Family Medicine

## 2021-07-21 ENCOUNTER — Other Ambulatory Visit: Payer: Self-pay

## 2021-07-21 VITALS — BP 128/74 | Temp 97.8°F | Wt 232.2 lb

## 2021-07-21 DIAGNOSIS — R490 Dysphonia: Secondary | ICD-10-CM

## 2021-07-21 DIAGNOSIS — F322 Major depressive disorder, single episode, severe without psychotic features: Secondary | ICD-10-CM

## 2021-07-21 DIAGNOSIS — M25551 Pain in right hip: Secondary | ICD-10-CM

## 2021-07-21 DIAGNOSIS — I10 Essential (primary) hypertension: Secondary | ICD-10-CM

## 2021-07-21 DIAGNOSIS — J439 Emphysema, unspecified: Secondary | ICD-10-CM | POA: Diagnosis not present

## 2021-07-21 DIAGNOSIS — I700 Atherosclerosis of aorta: Secondary | ICD-10-CM | POA: Diagnosis not present

## 2021-07-21 DIAGNOSIS — Z79899 Other long term (current) drug therapy: Secondary | ICD-10-CM

## 2021-07-21 DIAGNOSIS — F411 Generalized anxiety disorder: Secondary | ICD-10-CM

## 2021-07-21 DIAGNOSIS — D692 Other nonthrombocytopenic purpura: Secondary | ICD-10-CM | POA: Insufficient documentation

## 2021-07-21 MED ORDER — AMLODIPINE BESYLATE 10 MG PO TABS: ORAL_TABLET | ORAL | 1 refills | Status: DC

## 2021-07-21 MED ORDER — ALPRAZOLAM 0.25 MG PO TABS: ORAL_TABLET | ORAL | 0 refills | Status: DC

## 2021-07-21 MED ORDER — ROSUVASTATIN CALCIUM 10 MG PO TABS: ORAL_TABLET | ORAL | 1 refills | Status: DC

## 2021-07-21 MED ORDER — SPIRIVA HANDIHALER 18 MCG IN CAPS: ORAL_CAPSULE | RESPIRATORY_TRACT | 5 refills | Status: DC

## 2021-07-21 MED ORDER — ESCITALOPRAM OXALATE 20 MG PO TABS: ORAL_TABLET | ORAL | 1 refills | Status: DC

## 2021-07-21 MED ORDER — DICLOFENAC SODIUM 75 MG PO TBEC: 75.0000 mg | DELAYED_RELEASE_TABLET | Freq: Two times a day (BID) | ORAL | 0 refills | Status: DC

## 2021-07-21 NOTE — Progress Notes (Signed)
° °  Subjective:    Patient ID: [name], female    DOB: [birth year], 56 y.o.   MRN: [id number]  HPI Pt following up on HTN. Pt states blood pressure has been good.  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  Does a lot of movement with the right arm lifts up to 70 pounds does a lot of stepping relates a lot of pain discomfort over the past several weeks and shoulder pain discomfort over the past month   Sick 2-3 months and pt is now hoarse all the time. Pt states she is not sick and throat is not painful.  Relates that she feels hoarse all the time she does have an extensive history of smoking  Help with weight loss; pt is doing diet and exercise (has not been that motivated).  Seeing a healthy weight wellness center  Doing her counseling for depression taking her medication states depression is stable  Aortic atherosclerosis (HCC) - Plan: Lipid Profile, Hepatic function panel, Basic Metabolic Panel (BMET)  Pulmonary emphysema, unspecified emphysema type ([place]), Chronic  Benign essential HTN - Plan: amLODipine (NORVASC) 10 MG tablet, Lipid Profile, Hepatic function panel, Basic Metabolic Panel (BMET)  Hoarseness - Plan: Ambulatory referral to ENT  Right hip pain  GAD (generalized anxiety disorder) - Plan: escitalopram (LEXAPRO) 20 MG tablet  High risk medication use - Plan: Lipid Profile, Hepatic function panel, Basic Metabolic Panel (BMET)    Review of Systems     Objective:   Physical Exam General-in no acute distress Eyes-no discharge Lungs-respiratory rate normal, CTA CV-no murmurs,RRR Extremities skin warm dry no edema Neuro grossly normal Behavior normal, alert        Assessment & Plan:  1. Aortic atherosclerosis (HCC) Aortic atherosclerosis healthy  diet recommended continue medication - Lipid Profile - Hepatic function panel - Basic Metabolic Panel (BMET)  2. Pulmonary emphysema, unspecified emphysema type ([place]) Use her inhalers refills given quit smoking  3. Benign essential HTN Blood pressure good control continue current measures - amLODipine (NORVASC) 10 MG tablet; TAKE 1 TABLET(10 MG) BY MOUTH DAILY  Dispense: 90 tablet; Refill: 1 - Lipid Profile - Hepatic function panel - Basic Metabolic Panel (BMET)  4. Hoarseness Hoarseness referral to ENT - Ambulatory referral to ENT  5. Right hip pain Anti-inflammatory for the next 2 weeks if any ongoing troubles referral to Ortho  6. GAD (generalized anxiety disorder) Continue Lexapro.  Xanax for sparing use - escitalopram (LEXAPRO) 20 MG tablet; TAKE 1 TABLET(20 MG) BY MOUTH DAILY  Dispense: 90 tablet; Refill: 1  7. High risk medication use Caution drowsiness in regards to Xanax Labs ordered - Lipid Profile - Hepatic function panel - Basic Metabolic Panel (BMET) '

## 2021-08-06 ENCOUNTER — Other Ambulatory Visit: Payer: Self-pay

## 2021-08-06 ENCOUNTER — Ambulatory Visit (INDEPENDENT_AMBULATORY_CARE_PROVIDER_SITE_OTHER): Payer: Federal, State, Local not specified - PPO | Admitting: Clinical

## 2021-08-06 DIAGNOSIS — F331 Major depressive disorder, recurrent, moderate: Secondary | ICD-10-CM | POA: Diagnosis not present

## 2021-08-06 DIAGNOSIS — F419 Anxiety disorder, unspecified: Secondary | ICD-10-CM

## 2021-08-06 NOTE — Progress Notes (Signed)
Virtual Visit via Video Note ? ?I connected with [name] on 08/06/21 at  2:00 PM EST by a video enabled telemedicine application and verified that I am speaking with the correct person using two identifiers. ? ?Location: ?Patient: Home ?Provider: Office ?  ?I discussed the limitations of evaluation and management by telemedicine and the availability of in person appointments. The patient expressed understanding and agreed to proceed. ? ?THERAPIST PROGRESS NOTE ?  ?Session Time: 2:00 PM-2:30 PM ?  ?Participation Level: Active ?  ?Behavioral Response: CasualAlertAnxious ?  ?Type of Therapy: Individual Therapy ?  ?Treatment Goals addressed: Anger and Coping ?  ?Interventions: CBT, Motivational Interviewing, Solution Focused and Strength-based ?  ?Summary: [name] is a 56 y.o. female who presents with GAD and ADHD. The OPT therapist worked with the patient for her ongoing OPT treatment. The OPT therapist utilized Motivational Interviewing to assist in creating therapeutic repore. The patient in the session was engaged and work in collaboration giving feedback about her triggers and symptoms over the past few weeks. The patient spoke about having some wins/success moments over the past few weeks leaning on. The OPT therapist utilized Cognitive Behavioral Therapy through cognitive restructuring as well as worked with the patient on coping strategies to assist in management of Anxiety. The OPT therapist worked in the session with the patient on challenging negative thoughts and implementing positive thinking and working within her limits and continuing to set healthy boundaries.The patient spoke about since the last session making a to do list and low hanging fruit and having some success and this improving her mood and interactions over the past week. The patient is waiting for referrals from her PCP to some health care specialist. ? ?Suicidal/Homicidal: Nowithout intent/plan ?  ?Therapist  Response: The OPT therapist worked with the patient for the patients scheduled session. The patient was engaged in her session and gave feedback in relation to triggers, symptoms, and behavior responses over the past few weeks. The OPT therapist worked with the patient utilizing an in session Cognitive Behavioral Therapy exercise. The patient was responsive in the session and verbalized, " I did make a to do list and I completed 6 of the things that were on my list and so things have been better". The OPT therapist worked with the patient on balancing her work/life and implementing leisure along with a more positive thought lens. The patient is working on healthy changes including change in her diet, exercising, and smoking cessation. The patient spoke about upcoming leisure expectations with going to a upcoming music concert.The patient spoke about planned upgrades to her home both inside and outside. The OPT therapist reviewed the importance of continuing to be consistent with medication therapy and all recommendations and scheduled health appointments. ?  ?Plan: Return again in 2/3 weeks. ?  ?Diagnosis:      Axis I: Depression w Anxiety ?  ?                        Axis II: No diagnosis ? ? ?Collaboration of Care: No additional collaboration of care for this session. ? ?Patient/Guardian was advised Release of Information must be obtained prior to any record release in order to collaborate their care with an outside provider. Patient/Guardian was advised if they have not already done so to contact the registration department to sign all necessary forms in order for [place] to release information regarding their care.  ? ?Consent: Patient/Guardian gives verbal consent  for treatment and assignment of benefits for services provided during this visit. Patient/Guardian expressed understanding and agreed to proceed.  ? ?  ?I discussed the assessment and treatment plan with the patient. The patient was provided an opportunity  to ask questions and all were answered. The patient agreed with the plan and demonstrated an understanding of the instructions. ?  ?The patient was advised to call back or seek an in-person evaluation if the symptoms worsen or if the condition fails to improve as anticipated. ?  ?I provided 30 minutes of non-face-to-face time during this encounter. ?  ?Greer Koeppen T. Eulas Post, LCSW ?  ?08/06/2021 ? ?

## 2021-08-11 ENCOUNTER — Encounter: Payer: Self-pay | Admitting: Family Medicine

## 2021-08-12 ENCOUNTER — Telehealth: Payer: Self-pay | Admitting: Family Medicine

## 2021-08-12 DIAGNOSIS — M25519 Pain in unspecified shoulder: Secondary | ICD-10-CM

## 2021-08-12 DIAGNOSIS — R490 Dysphonia: Secondary | ICD-10-CM

## 2021-08-12 DIAGNOSIS — M25559 Pain in unspecified hip: Secondary | ICD-10-CM

## 2021-08-12 DIAGNOSIS — F411 Generalized anxiety disorder: Secondary | ICD-10-CM

## 2021-08-12 DIAGNOSIS — F322 Major depressive disorder, single episode, severe without psychotic features: Secondary | ICD-10-CM

## 2021-08-12 NOTE — Telephone Encounter (Signed)
Nurses ?Referral was sent previously but because she has not heard anything it is important to reinitiate referrals ?See telephone message ?Please make sure patient is aware of the process going on thank you ?

## 2021-08-12 NOTE — Telephone Encounter (Signed)
Patient needs several referrals ?Please see my chart message ?Referrals as follows ?#1-ENT referral-diagnosis for symptoms ?#2 psychiatry referral-I believe patient was interested in [facility] counseling and consultation ?Anxiety and depression ?#3-orthopedic discomfort hip and shoulder pain-referral to Dr. Amedeo Kinsman here in [place] or [place] patient prefers ?

## 2021-08-12 NOTE — Telephone Encounter (Signed)
Referrals ordered in Epic. Patient notified via my chart ?

## 2021-08-20 ENCOUNTER — Other Ambulatory Visit: Payer: Self-pay

## 2021-08-20 ENCOUNTER — Ambulatory Visit (INDEPENDENT_AMBULATORY_CARE_PROVIDER_SITE_OTHER): Payer: Federal, State, Local not specified - PPO

## 2021-08-20 ENCOUNTER — Ambulatory Visit: Payer: Federal, State, Local not specified - PPO | Admitting: Orthopedic Surgery

## 2021-08-20 ENCOUNTER — Encounter: Payer: Self-pay | Admitting: Orthopedic Surgery

## 2021-08-20 VITALS — BP 164/90 | HR 81 | Ht 63.0 in | Wt 227.0 lb

## 2021-08-20 DIAGNOSIS — M25511 Pain in right shoulder: Secondary | ICD-10-CM

## 2021-08-20 DIAGNOSIS — M7581 Other shoulder lesions, right shoulder: Secondary | ICD-10-CM | POA: Diagnosis not present

## 2021-08-20 MED ORDER — DICLOFENAC SODIUM 75 MG PO TBEC: 75.0000 mg | DELAYED_RELEASE_TABLET | Freq: Two times a day (BID) | ORAL | 0 refills | Status: DC
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
## 2021-08-20 NOTE — Patient Instructions (Signed)
Rotator Cuff Tear/Tendinitis Rehab  ? ?Ask your health care provider which exercises are safe for you. Do exercises exactly as told by your health care provider and adjust them as directed. It is normal to feel mild stretching, pulling, tightness, or discomfort as you do these exercises. Stop right away if you feel sudden pain or your pain gets worse. Do not begin these exercises until told by your health care provider. ?Stretching and range-of-motion exercises ? ?These exercises warm up your muscles and joints and improve the movement and flexibility of your shoulder. These exercises also help to relieve pain. ? ?Shoulder pendulum ?In this exercise, you let the injured arm dangle toward the floor and then swing it like a clock pendulum. ?Stand near a table or counter that you can hold onto for balance. ?Bend forward at the waist and let your left / right arm hang straight down. Use your other arm to support you and help you stay balanced. ?Relax your left / right arm and shoulder muscles, and move your hips and your trunk so your left / right arm swings freely. Your arm should swing because of the motion of your body, not because you are using your arm or shoulder muscles. ?Keep moving your hips and trunk so your arm swings in the following directions, as told by your health care provider: ?Side to side. ?Forward and backward. ?In clockwise and counterclockwise circles. ?Slowly return to the starting position. ?Repeat 10 times, or for 10 seconds per direction. Complete this exercise 2-3 times a day. ?  ?   ?Shoulder flexion, seated ?This exercise is sometimes called table slides. In this exercise, you raise your arm in front of your body until you feel a stretch in your injured shoulder. ?Sit in a stable chair so your left / right forearm can rest on a flat surface. Your elbow should rest at a height that keeps your upper arm next to your body. ?Keeping your left / right shoulder relaxed, lean forward at the waist  and let your hand slide forward (flexion). Stop when you feel a stretch in your shoulder, or when you reach the angle that is recommended by your health care provider. ?Hold for 5 seconds. ?Slowly return to the starting position. ?Repeat 10 times. Complete this exercise 1-2  times a day. ?      ?Shoulder flexion, standing ?In this exercise, you raise your arm in front of your body (flexion) until you feel a stretch in your injured shoulder. ?Stand and hold a broomstick, a cane, or a similar object. Place your hands a little more than shoulder-width apart on the object. Your left / right hand should be palm-up, and your other hand should be palm-down. ?Keep your elbow straight and your shoulder muscles relaxed. Push the stick up with your healthy arm to raise your left / right arm in front of your body, and then over your head until you feel a stretch in your shoulder. ?Avoid shrugging your shoulder while you raise your arm. Keep your shoulder blade tucked down toward the middle of your back. ?Keep your left / right shoulder muscles relaxed. ?Hold for 10 seconds. ?Slowly return to the starting position. ?Repeat 10 times. Complete this exercise 1-2 times a day. ?  ?   ?Shoulder abduction, active-assisted ?You will need a stick, broom handle, or similar object to help you (assist) in doing this exercise. ?Lie on your back. This is the supine position. Hold a broomstick, a cane, or a similar   object. ?Place your hands a little more than shoulder-width apart on the object. Your left / right hand should be palm-up, and your other hand should be palm-down. ?Keeping your shoulder relaxed, push the stick to raise your left / right arm out to your side (abduction) and then over your head. Use your other hand to help move the stick. Stop when you feel a stretch in your shoulder, or when you reach the angle that is recommended by your health care provider. ?Avoid shrugging your shoulder while you raise your arm. Keep your  shoulder blade tucked down toward the middle of your back. ?Hold for 10 seconds. ?Slowly return to the starting position. ?Repeat 10 times. Complete this exercise 1-2 times a day. ?  ?   ?Shoulder flexion, active-assisted ?Lie on your back. You may bend your knees for comfort. ?Hold a broomstick, a cane, or a similar object so that your hands are about shoulder-width apart. Your palms should face toward your feet. ?Raise your left / right arm over your head, then behind your head toward the floor (flexion). Use your other hand to help you do this (active-assisted). Stop when you feel a gentle stretch in your shoulder, or when you reach the angle that is recommended by your health care provider. ?Hold for 10 seconds. ?Use the stick and your other arm to help you return your left / right arm to the starting position. ?Repeat 10 times. Complete this exercise 1-2 times a day. ?  ?   ?External rotation ?Sit in a stable chair without armrests, or stand up. ?Tuck a soft object, such as a folded towel or a small ball, under your left / right upper arm. ?Hold a broomstick, a cane, or a similar object with your palms face-down, toward the floor. Bend your elbows to a 90-degree angle (right angle), and keep your hands about shoulder-width apart. ?Straighten your healthy arm and push the stick across your body, toward your left / right side. Keep your left / right arm bent. This will rotate your left / right forearm away from your body (external rotation). ?Hold for 10 seconds. ?Slowly return to the starting position. ?Repeat 10 times. Complete this exercise 1-2 times a day.  ?   ? ? ? ?Strengthening exercises ?These exercises build strength and endurance in your shoulder. Endurance is the ability to use your muscles for a long time, even after they get tired. Do not start doing these exercises until your health care provider approves. ?Shoulder flexion, isometric ?Stand or sit in a doorway, facing the door frame. ?Keep your  left / right arm straight and make a gentle fist with your hand. Place your fist against the door frame. Only your fist should be touching the frame. Keep your upper arm at your side. ?Gently press your fist against the door frame, as if you are trying to raise your arm above your head (isometric shoulder flexion). ?Avoid shrugging your shoulder while you press your hand into the door frame. Keep your shoulder blade tucked down toward the middle of your back. ?Hold for 10 seconds. ?Slowly release the tension, and relax your muscles completely before you repeat the exercise. ?Repeat 10 times. Complete this exercise 3 times per week. ?     ?Shoulder abduction, isometric ?Stand or sit in a doorway. Your left / right arm should be closest to the door frame. ?Keep your left / right arm straight, and place the back of your hand against the door frame. Only   your hand should be touching the frame. Keep the rest of your arm close to your side. ?Gently press the back of your hand against the door frame, as if you are trying to raise your arm out to the side (isometric shoulder abduction). ?Avoid shrugging your shoulder while you press your hand into the door frame. Keep your shoulder blade tucked down toward the middle of your back. ?Hold for 10 seconds. ?Slowly release the tension, and relax your muscles completely before you repeat the exercise. ?Repeat 10 times. Complete this exercise 3 times per week. ?  ?   ?Internal rotation, isometric ?This is an exercise in which you press your palm against a door frame without moving your shoulder joint (isometric). ?Stand or sit in a doorway, facing the door frame. ?Bend your left / right elbow, and place the palm of your hand against the door frame. Only your palm should be touching the frame. Keep your upper arm at your side. ?Gently press your hand against the door frame, as if you are trying to push your arm toward your abdomen (internal rotation). Gradually increase the  pressure until you are pressing as hard as you can. Stop increasing the pressure if you feel shoulder pain. ?Avoid shrugging your shoulder while you press your hand into the door frame. Keep your shoulder blade tuck

## 2021-09-04 ENCOUNTER — Ambulatory Visit (INDEPENDENT_AMBULATORY_CARE_PROVIDER_SITE_OTHER): Payer: Federal, State, Local not specified - PPO | Admitting: Clinical

## 2021-09-04 DIAGNOSIS — F331 Major depressive disorder, recurrent, moderate: Secondary | ICD-10-CM

## 2021-09-04 DIAGNOSIS — F419 Anxiety disorder, unspecified: Secondary | ICD-10-CM

## 2021-09-04 NOTE — Progress Notes (Signed)
Virtual Visit via Video Note ?  ?I connected with [name] on 09/04/21 at  2:00 PM EST by a video enabled telemedicine application and verified that I am speaking with the correct person using two identifiers. ?  ?Location: ?Patient: Home ?Provider: Office ?  ?I discussed the limitations of evaluation and management by telemedicine and the availability of in person appointments. The patient expressed understanding and agreed to proceed. ?  ?THERAPIST PROGRESS NOTE ?  ?Session Time: 2:00 PM-2:45 PM ?  ?Participation Level: Active ?  ?Behavioral Response: CasualAlertAnxious ?  ?Type of Therapy: Individual Therapy ?  ?Treatment Goals addressed: Anger and Coping ?  ?Interventions: CBT, Motivational Interviewing, Solution Focused and Strength-based ?  ?Summary: [name] is a 56 y.o. female who presents with GAD and ADHD. The OPT therapist worked with the patient for her ongoing OPT treatment. The OPT therapist utilized Motivational Interviewing to assist in creating therapeutic repore. The patient in the session was engaged and work in collaboration giving feedback about her triggers and symptoms over the past few weeks. 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 The OPT therapist worked in the session with the patient on challenging negative thoughts and implementing positive thinking and working within her limits and continuing to set healthy boundaries.The patient spoke about since the last session doing better with her emotion control despite her current job stressors. ? ?  ?Suicidal/Homicidal: Nowithout intent/plan ?  ?Therapist Response: The OPT therapist worked with the patient for the patients scheduled session. The patient  was engaged in her session and gave feedback in relation to triggers, symptoms, and behavior responses over the past few weeks. The OPT therapist worked with the patient utilizing an in session Cognitive Behavioral Therapy exercise. The patient was responsive in the session and verbalized, " I realize even with the financial cuts I still cant go elsewhere and make the kind of money I make somewhere else so I have been trying to see the positive side". The OPT therapist worked with the patient on balancing her work/life and implementing leisure along with a more positive thought lens. The patient is working on healthy changes including change in her diet, exercising, and smoking cessation. The patient spoke about some recent ups and downs in her dating relationship. The OPT therapist reviewed the importance of continuing to be consistent with medication therapy and all recommendations and scheduled health appointments. ?  ?Plan: Return again in 2/3 weeks. ?  ?Diagnosis:      Axis I: Depression w Anxiety ?  ?                        Axis II: No diagnosis ?  ?  ?Collaboration of Care: No additional collaboration of care for this session. ?  ?Patient/Guardian was advised Release of Information must be obtained prior to any record release in order to collaborate their care with an outside provider. Patient/Guardian was advised if they have not already done so to contact the registration department to sign all necessary forms in order for [place] to release information regarding their care.  ?  ?Consent: Patient/Guardian gives verbal consent for treatment and assignment of benefits for  services provided during this visit. Patient/Guardian expressed understanding and agreed to proceed.  ?  ?  ?I discussed the assessment and treatment plan with the patient. The patient was provided an opportunity to ask questions and all were answered. The patient agreed with the plan and demonstrated an understanding of the instructions. ?  ?The  patient was advised to call back or seek an in-person evaluation if the symptoms worsen or if the condition fails to improve as anticipated. ?  ?I provided 45 minutes of non-face-to-face time during this encounter. ?  ?Montrice Montuori T. Eulas Post, LCSW ?  ?09/04/2021 ?

## 2021-09-15 ENCOUNTER — Other Ambulatory Visit: Payer: Self-pay | Admitting: Orthopedic Surgery

## 2021-10-06 ENCOUNTER — Ambulatory Visit (INDEPENDENT_AMBULATORY_CARE_PROVIDER_SITE_OTHER): Payer: Federal, State, Local not specified - PPO | Admitting: Clinical

## 2021-10-06 DIAGNOSIS — F331 Major depressive disorder, recurrent, moderate: Secondary | ICD-10-CM | POA: Diagnosis not present

## 2021-10-06 DIAGNOSIS — F419 Anxiety disorder, unspecified: Secondary | ICD-10-CM

## 2021-10-06 NOTE — Progress Notes (Signed)
Virtual Visit via Video Note ?  ?I connected with [name] on 10/06/21 at  1:00 PM EST by a video enabled telemedicine application and verified that I am speaking with the correct person using two identifiers. ?  ?Location: ?Patient: Home ?Provider: Office ?  ?I discussed the limitations of evaluation and management by telemedicine and the availability of in person appointments. The patient expressed understanding and agreed to proceed. ?  ?THERAPIST PROGRESS NOTE ?  ?Session Time: 1:00 PM-1:45 PM ?  ?Participation Level: Active ?  ?Behavioral Response: CasualAlertAnxious ?  ?Type of Therapy: Individual Therapy ?  ?Treatment Goals addressed: Anger and Coping ?  ?Interventions: CBT, Motivational Interviewing, Solution Focused and Strength-based ?  ?Summary: [name] is a 56 y.o. female who presents with GAD and ADHD. The OPT therapist worked with the patient for her ongoing OPT treatment. The OPT therapist utilized Motivational Interviewing to assist in creating therapeutic repore. The patient in the session was engaged and work in collaboration giving feedback about her triggers and symptoms over the past few weeks. The patient spoke about going through some ups and downs getting into watching diy projects and she went on a shopping spree buying a lot of materials to try to duplicate some of the projects she had seen online. The OPT therapist reviewed with the patient the risk of her pattern with the ceiling and floor or the highs and the lows are extreme. The OPT therapist worked in the session with the patient on challenging negative thoughts and implementing positive thinking and working within her limits and continuing to set healthy boundaries.  ?  ?Suicidal/Homicidal: Nowithout intent/plan ?  ?Therapist Response: The OPT therapist worked with the patient for the patients scheduled session. The patient was engaged in her session and gave feedback in relation to triggers, symptoms, and  behavior responses over the past few weeks. The OPT therapist worked with the patient utilizing an in session Cognitive Behavioral Therapy exercise. The patient was responsive in the session and verbalized, " I realize if I just had picked one thing and worked on one thing it wouldn't have been as bad but I kept watching videos and I then went and spent all this money". The OPT therapist worked with the patient on balancing her work/life and implementing leisure along with a more positive thought lens. The patient is working on healthy changes and choice. The OPT therapist worked with the patient on moderation and implementing a pause when she gets into a manic style episode.. The OPT therapist reviewed the importance of continuing to be consistent with medication therapy and all recommendations and scheduled health appointments. ?  ?Plan: Return again in 2/3 weeks. ?  ?Diagnosis:      Axis I: Depression w Anxiety ?  ?                        Axis II: No diagnosis ?  ?  ?Collaboration of Care: No additional collaboration of care for this session. ?  ?Patient/Guardian was advised Release of Information must be obtained prior to any record release in order to collaborate their care with an outside provider. Patient/Guardian was advised if they have not already done so to contact the registration department to sign all necessary forms in order for [place] to release information regarding their care.  ?  ?Consent: Patient/Guardian gives verbal consent for treatment and assignment of benefits for services provided during this visit. Patient/Guardian expressed understanding and  agreed to proceed.  ?  ?  ?I discussed the assessment and treatment plan with the patient. The patient was provided an opportunity to ask questions and all were answered. The patient agreed with the plan and demonstrated an understanding of the instructions. ?  ?The patient was advised to call back or seek an in-person evaluation if the symptoms worsen  or if the condition fails to improve as anticipated. ?  ?I provided 45 minutes of non-face-to-face time during this encounter. ?  ?Avory Mimbs T. Eulas Post, LCSW ?  ?10/06/2021 ?

## 2021-10-12 ENCOUNTER — Other Ambulatory Visit: Payer: Self-pay | Admitting: Orthopedic Surgery

## 2021-11-04 ENCOUNTER — Other Ambulatory Visit: Payer: Self-pay | Admitting: Family Medicine

## 2021-11-04 ENCOUNTER — Ambulatory Visit (INDEPENDENT_AMBULATORY_CARE_PROVIDER_SITE_OTHER): Payer: Federal, State, Local not specified - PPO | Admitting: Clinical

## 2021-11-04 DIAGNOSIS — F419 Anxiety disorder, unspecified: Secondary | ICD-10-CM

## 2021-11-04 DIAGNOSIS — F322 Major depressive disorder, single episode, severe without psychotic features: Secondary | ICD-10-CM

## 2021-11-04 DIAGNOSIS — F411 Generalized anxiety disorder: Secondary | ICD-10-CM

## 2021-11-04 DIAGNOSIS — F331 Major depressive disorder, recurrent, moderate: Secondary | ICD-10-CM

## 2021-11-04 NOTE — Progress Notes (Signed)
Virtual Visit via Video Note   I connected with [name] on 11/04/21 at  1:00 PM EST by a video enabled telemedicine application and verified that I am speaking with the correct person using two identifiers.   Location: Patient: Home Provider: Office   I discussed the limitations of evaluation and management by telemedicine and the availability of in person appointments. The patient expressed understanding and agreed to proceed.   THERAPIST PROGRESS NOTE   Session Time: 1:00 PM-1:30 PM   Participation Level: Active   Behavioral Response: CasualAlertAnxious   Type of Therapy: Individual Therapy   Treatment Goals addressed: Anger and Coping   Interventions: CBT, Motivational Interviewing, Solution Focused and Strength-based   Summary: [name] is a 56 y.o. female who presents with GAD and ADHD. The OPT therapist worked with the patient for her ongoing OPT treatment. The OPT therapist utilized Motivational Interviewing to assist in creating therapeutic repore. The patient in the session was engaged and work in collaboration giving feedback about her triggers and symptoms over the past few weeks. The patient in this session is recovering from having a mouth surgery earlier this morning. The patient noted she did take tomorrow off to focus on healing.The patient spoke about again spending too much money thrift shopping. The patient is working on her living room, bathroom, and kitchen.The OPT therapist worked in the session with the patient on challenging negative thoughts and implementing positive thinking and working within her limits and continuing to set healthy boundaries and not impulse buying.    Suicidal/Homicidal: Nowithout intent/plan   Therapist Response: The OPT therapist worked with the patient for the patients scheduled session. The patient was engaged in her session and gave feedback in relation to triggers, symptoms, and behavior responses over the past  few weeks. The OPT therapist worked with the patient utilizing an in session Cognitive Behavioral Therapy exercise. The patient was responsive in the session and verbalized, " I have spent too much money again on crafting and I cant get it to look the way it did on Youtube". The OPT therapist worked with the patient on balancing her work/life and implementing leisure along with a more positive thought lens. The patient is working on healthy changes and choice. The OPT therapist worked with the patient on moderation and implementing a pause when she gets into a manic style episode and worked with the patient on impulsive behaviors. The patient will be taking some pain medicine as part of recovery from having mouth surgery this morning. The OPT therapist reviewed the importance of continuing to be consistent with medication therapy and all recommendations and scheduled health appointments.   Plan: Return again in 2/3 weeks.   Diagnosis:      Axis I: Depression w Anxiety                           Axis II: No diagnosis     Collaboration of Care: No additional collaboration of care for this session.   Patient/Guardian was advised Release of Information must be obtained prior to any record release in order to collaborate their care with an outside provider. Patient/Guardian was advised if they have not already done so to contact the registration department to sign all necessary forms in order for [place] to release information regarding their care.    Consent: Patient/Guardian gives verbal consent for treatment and assignment of benefits for services provided during this visit. Patient/Guardian expressed understanding  and agreed to proceed.      I discussed the assessment and treatment plan with the patient. The patient was provided an opportunity to ask questions and all were answered. The patient agreed with the plan and demonstrated an understanding of the instructions.   The patient was advised to call  back or seek an in-person evaluation if the symptoms worsen or if the condition fails to improve as anticipated.   I provided 30 minutes of non-face-to-face time during this encounter.   Nevaya Nagele T. Eulas Post, [place]   11/04/2021

## 2021-11-05 MED ORDER — ALPRAZOLAM 0.25 MG PO TABS: ORAL_TABLET | ORAL | 0 refills | Status: DC

## 2021-11-15 ENCOUNTER — Other Ambulatory Visit: Payer: Self-pay | Admitting: Orthopedic Surgery

## 2021-11-25 ENCOUNTER — Ambulatory Visit (INDEPENDENT_AMBULATORY_CARE_PROVIDER_SITE_OTHER): Payer: Federal, State, Local not specified - PPO | Admitting: Clinical

## 2021-11-25 DIAGNOSIS — F331 Major depressive disorder, recurrent, moderate: Secondary | ICD-10-CM

## 2021-11-25 DIAGNOSIS — F419 Anxiety disorder, unspecified: Secondary | ICD-10-CM | POA: Diagnosis not present

## 2021-12-18 ENCOUNTER — Other Ambulatory Visit: Payer: Self-pay | Admitting: Orthopedic Surgery

## 2021-12-23 ENCOUNTER — Ambulatory Visit (INDEPENDENT_AMBULATORY_CARE_PROVIDER_SITE_OTHER): Payer: Federal, State, Local not specified - PPO | Admitting: Clinical

## 2021-12-23 DIAGNOSIS — F419 Anxiety disorder, unspecified: Secondary | ICD-10-CM | POA: Diagnosis not present

## 2021-12-23 DIAGNOSIS — F331 Major depressive disorder, recurrent, moderate: Secondary | ICD-10-CM | POA: Diagnosis not present

## 2021-12-23 NOTE — Progress Notes (Signed)
Virtual Visit via Telephone Note   I connected with [name] on 12/23/21 at  1:00 PM EDT by telephone and verified that I am speaking with the correct person using two identifiers.   Location: Patient: Home Provider: Office   I discussed the limitations, risks, security and privacy concerns of performing an evaluation and management service by telephone and the availability of in person appointments. I also discussed with the patient that there may be a patient responsible charge related to this service. The patient expressed understanding and agreed to proceed.     THERAPIST PROGRESS NOTE   Session Time: 1:00 PM-1:45 PM   Participation Level: Active   Behavioral Response: CasualAlertAnxious   Type of Therapy: Individual Therapy   Treatment Goals addressed: Anger and Coping   Interventions: CBT, Motivational Interviewing, Solution Focused and Strength-based   Summary: [name] is a 56 y.o. female who presents with GAD and ADHD. The OPT therapist worked with the patient for her ongoing OPT treatment. The OPT therapist utilized Motivational Interviewing to assist in creating therapeutic repore. The patient in the session was engaged and work in collaboration giving feedback about her triggers and symptoms over the past few weeks. The patient in this session spoke about thrift shopping, in home projects, and interactions with her boyfriend. The patient in this session spoke about implenting her mood management skills in her interactions with her boyfriend including work to use empathetic thinking.The OPT therapist worked in the session with the patient on challenging negative thoughts and implementing positive thinking. The OPT therapist continued in this session working with the patient on mood management, monitoring baseline, and communication strategies. 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 The patient spoke about upcoming music festival she plans to attend as a mini vacation.The OPT therapist overviewed with patient her MyChart and upcoming health appointments including upcoming PCP appointment on 01/19/2022.   Suicidal/Homicidal: Nowithout intent/plan   Therapist Response: The OPT therapist worked with the patient for the patients scheduled session. The patient was engaged in her session and gave feedback in relation to triggers, symptoms, and behavior responses over the past few weeks. The OPT therapist worked with the patient utilizing an in session Cognitive Behavioral Therapy exercise. The patient was responsive in the session and verbalized, " I am proud of myself In another conversation with my boyfriend I started to take his comments personal, but then I checked myself and I tried to look at it from his point of view and not take what he was saying as a personal attack and I am proud of myself because I have been in the past very argumentative, others previously have said and pointed out I am always in battle mode ". The OPT therapist worked with the patient on balancing her work/life and implementing leisure along with a more positive thought lens. The patient is working on healthy changes and choices.  The OPT therapist worked with the patient on her reactive/ impulsive behaviors.The OPT therapist worked with the patient on moderation and implementing a pause when she gets frustrated or overwhelmed. The patient spoke about her urge to move to another state, but verbalized she is going to not impulsively move/ buy and rv and move to another state.The OPT therapist reviewed the importance of continuing  to be consistent with medication therapy and all recommendations and scheduled health appointments.   Plan: Return again in 2/3 weeks.   Diagnosis:      Axis I: Depression w Anxiety                            Axis II: No diagnosis     Collaboration of Care: No additional collaboration of care for this session.   Patient/Guardian was advised Release of Information must be obtained prior to any record release in order to collaborate their care with an outside provider. Patient/Guardian was advised if they have not already done so to contact the registration department to sign all necessary forms in order for [place] to release information regarding their care.    Consent: Patient/Guardian gives verbal consent for treatment and assignment of benefits for services provided during this visit. Patient/Guardian expressed understanding and agreed to proceed.      I discussed the assessment and treatment plan with the patient. The patient was provided an opportunity to ask questions and all were answered. The patient agreed with the plan and demonstrated an understanding of the instructions.   The patient was advised to call back or seek an in-person evaluation if the symptoms worsen or if the condition fails to improve as anticipated.   I provided 45 minutes of non-face-to-face time during this encounter.   [name] T. Eulas Post, Marlinda Mike   12/23/2021

## 2022-01-18 ENCOUNTER — Other Ambulatory Visit: Payer: Self-pay | Admitting: Orthopedic Surgery

## 2022-01-18 ENCOUNTER — Other Ambulatory Visit: Payer: Self-pay | Admitting: Family Medicine

## 2022-01-19 ENCOUNTER — Other Ambulatory Visit: Payer: Self-pay | Admitting: Family Medicine

## 2022-01-19 ENCOUNTER — Encounter: Payer: Self-pay | Admitting: Family Medicine

## 2022-01-19 ENCOUNTER — Ambulatory Visit: Payer: Federal, State, Local not specified - PPO | Admitting: Family Medicine

## 2022-01-19 VITALS — BP 130/74 | HR 84 | Temp 98.1°F | Ht 63.0 in

## 2022-01-19 DIAGNOSIS — F322 Major depressive disorder, single episode, severe without psychotic features: Secondary | ICD-10-CM

## 2022-01-19 DIAGNOSIS — F411 Generalized anxiety disorder: Secondary | ICD-10-CM

## 2022-01-19 DIAGNOSIS — E785 Hyperlipidemia, unspecified: Secondary | ICD-10-CM

## 2022-01-19 DIAGNOSIS — I10 Essential (primary) hypertension: Secondary | ICD-10-CM

## 2022-01-19 MED ORDER — AMLODIPINE BESYLATE 10 MG PO TABS: ORAL_TABLET | ORAL | 1 refills | Status: DC

## 2022-01-19 MED ORDER — ROSUVASTATIN CALCIUM 10 MG PO TABS: ORAL_TABLET | ORAL | 1 refills | Status: DC

## 2022-01-19 MED ORDER — ESCITALOPRAM OXALATE 20 MG PO TABS: ORAL_TABLET | ORAL | 1 refills | Status: DC

## 2022-01-19 NOTE — Progress Notes (Signed)
   Subjective:    Patient ID: [name], female    DOB: [birth year], 56 y.o.   MRN: [id number]  Hypertension This is a chronic problem. Treatments tried: amlodipine.   GAD follow up screening = 15  States struggling with adhd , does receive counseling services  She does counseling She finds her self feeling overwhelmed at times Is involved in many different adventures regarding trying to sell items but she finds her self overwhelmed wonders if her ADD is affecting her.  But at the same time though she relates a almost compulsion to buy things sell things   Review of Systems     Objective:   Physical Exam  General-in no acute distress Eyes-no discharge Lungs-respiratory rate normal, CTA CV-no murmurs,RRR Extremities skin warm dry no edema Neuro grossly normal Behavior normal, alert       Assessment & Plan:  1. Hyperlipidemia, unspecified hyperlipidemia type Check lipid profile healthy diet regular physical activity recommended-continue Crestor - Lipid panel - Hepatic Function Panel - Basic metabolic panel  2. GAD (generalized anxiety disorder) Bump up the dose of the Lexapro to every day this will also help if there is a component of OCD May even help with ADD related symptoms I do not feel comfortable starting Adderall medication I believe it would be wise for her to get input of psychiatrist on her medicines - escitalopram (LEXAPRO) 20 MG tablet; TAKE 2 TABLET(20 MG) BY MOUTH DAILY  Dispense: 180 tablet; Refill: 1 - Lipid panel - Hepatic Function Panel - Basic metabolic panel  3. Benign essential HTN Blood pressure good control continue current measures - amLODipine (NORVASC) 10 MG tablet; TAKE 1 TABLET(10 MG) BY MOUTH DAILY  Dispense: 90 tablet; Refill: 1 - Lipid panel - Hepatic Function Panel - Basic metabolic panel  Follow-up 6 months sooner problems

## 2022-01-20 ENCOUNTER — Telehealth (INDEPENDENT_AMBULATORY_CARE_PROVIDER_SITE_OTHER): Payer: Federal, State, Local not specified - PPO | Admitting: Clinical

## 2022-01-20 ENCOUNTER — Other Ambulatory Visit: Payer: Self-pay | Admitting: *Deleted

## 2022-01-20 DIAGNOSIS — F331 Major depressive disorder, recurrent, moderate: Secondary | ICD-10-CM

## 2022-01-20 DIAGNOSIS — F411 Generalized anxiety disorder: Secondary | ICD-10-CM

## 2022-01-20 DIAGNOSIS — F419 Anxiety disorder, unspecified: Secondary | ICD-10-CM | POA: Diagnosis not present

## 2022-01-20 MED ORDER — ALPRAZOLAM 0.25 MG PO TABS: ORAL_TABLET | ORAL | 0 refills | Status: DC

## 2022-01-20 NOTE — Progress Notes (Signed)
Virtual Visit via Video Note  I connected with [name] on 01/20/22 at  1:00 PM EDT by a video enabled telemedicine application and verified that I am speaking with the correct person using two identifiers.  Location: Patient: Home Provider: Office   I discussed the limitations of evaluation and management by telemedicine and the availability of in person appointments. The patient expressed understanding and agreed to proceed.      THERAPIST PROGRESS NOTE   Session Time: 1:00 PM-1:30 PM   Participation Level: Active   Behavioral Response: CasualAlertAnxious   Type of Therapy: Individual Therapy   Treatment Goals addressed: Anger and Coping   Interventions: CBT, Motivational Interviewing, Solution Focused and Strength-based   Summary: [name] is a 56 y.o. female who presents with GAD and ADHD. The OPT therapist worked with the patient for her ongoing OPT treatment. The OPT therapist utilized Motivational Interviewing to assist in creating therapeutic repore. The patient in the session was engaged and work in collaboration giving feedback about her triggers and symptoms over the past few weeks. The patient in this session spoke about Dr. Wolfgang Phoenix her PCP making a referral for the patient to be seen by a psychiatrist to take over her mental health medication.. 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 The OPT therapist continued in this session working with the patient on mood management, monitoring baseline, and communication strategies. 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   Suicidal/Homicidal: Nowithout intent/plan   Therapist Response: The OPT therapist worked with the patient for the patients scheduled session. The patient  was engaged in her session and gave feedback in relation to triggers, symptoms, and behavior responses over the past few weeks. The OPT therapist worked with the patient utilizing an in session Cognitive Behavioral Therapy exercise. The patient was responsive in the session and verbalized, " I got off work and started shopping at every [employer] in sight and I spent a lot to get things to resale, but I have not sold a lot and now I have a lot of things just here at my house , it just seems like people are looking at my stuff but no one is interested/ or buying anything". The patient is working on healthy changes and choices.  The OPT therapist worked with the patient on her reactive/ impulsive behaviors.The OPT therapist worked with the patient on moderation and implementing a pause when she gets frustrated or overwhelmed. 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   Plan: Return again in 2/3 weeks.   Diagnosis:      Axis I: Depression w Anxiety                           Axis II: No diagnosis     Collaboration of Care: The OPT therapist spoke with the patients PCP Dr. Wolfgang Phoenix who is referring the patient to [facility] for adult psychiatry.   Patient/Guardian was advised Release of Information must be obtained prior to any record release in order to collaborate their care with an outside provider. Patient/Guardian was advised if they  have not already done so to contact the registration department to sign all necessary forms in order for [place] to release information regarding their care.    Consent: Patient/Guardian gives verbal consent for treatment and assignment of benefits for services provided during this visit. Patient/Guardian expressed understanding and agreed to proceed.       I discussed the assessment and treatment plan with the patient. The patient was provided an opportunity to ask questions and all were answered. The patient agreed with the plan and demonstrated an understanding of the instructions.   The patient was advised to call back or seek an in-person evaluation if the symptoms worsen or if the condition fails to improve as anticipated.   I provided 30 minutes of non-face-to-face time during this encounter.   Maisha Bogen T. Eulas Post, Marlinda Mike   01/20/2022

## 2022-02-04 ENCOUNTER — Telehealth (HOSPITAL_COMMUNITY): Payer: Federal, State, Local not specified - PPO | Admitting: Psychiatry

## 2022-02-05 ENCOUNTER — Telehealth (HOSPITAL_COMMUNITY): Payer: Federal, State, Local not specified - PPO | Admitting: Psychiatry

## 2022-02-05 ENCOUNTER — Encounter (HOSPITAL_COMMUNITY): Payer: Self-pay | Admitting: Psychiatry

## 2022-02-05 DIAGNOSIS — F4310 Post-traumatic stress disorder, unspecified: Secondary | ICD-10-CM | POA: Diagnosis not present

## 2022-02-05 DIAGNOSIS — F5104 Psychophysiologic insomnia: Secondary | ICD-10-CM | POA: Diagnosis not present

## 2022-02-05 DIAGNOSIS — G8929 Other chronic pain: Secondary | ICD-10-CM

## 2022-02-05 DIAGNOSIS — R4587 Impulsiveness: Secondary | ICD-10-CM | POA: Diagnosis not present

## 2022-02-05 DIAGNOSIS — F331 Major depressive disorder, recurrent, moderate: Secondary | ICD-10-CM | POA: Insufficient documentation

## 2022-02-05 DIAGNOSIS — Z791 Long term (current) use of non-steroidal anti-inflammatories (NSAID): Secondary | ICD-10-CM

## 2022-02-05 DIAGNOSIS — F332 Major depressive disorder, recurrent severe without psychotic features: Secondary | ICD-10-CM | POA: Insufficient documentation

## 2022-02-05 DIAGNOSIS — F603 Borderline personality disorder: Secondary | ICD-10-CM | POA: Insufficient documentation

## 2022-02-05 DIAGNOSIS — F17200 Nicotine dependence, unspecified, uncomplicated: Secondary | ICD-10-CM

## 2022-02-05 MED ORDER — LAMOTRIGINE 25 MG PO TABS: ORAL_TABLET | ORAL | 0 refills | Status: DC

## 2022-02-05 NOTE — Patient Instructions (Addendum)
Thank you for coming to your appointment today. We added a new medication: lamotrigine (lamictal) to your regimen. Take this medication one pill ('25mg'$ ) nightly for 2 weeks. Then increase to 2 pills (for a total of '50mg'$ ) nightly for the next two weeks. If you experience a rash of any kind, please contact Dr. Nehemiah Settle for evaluation.
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
## 2022-02-13 ENCOUNTER — Other Ambulatory Visit: Payer: Self-pay | Admitting: Orthopedic Surgery

## 2022-02-13 NOTE — Progress Notes (Signed)
02/13/22-my chart message sent to patient.

## 2022-02-16 ENCOUNTER — Telehealth (INDEPENDENT_AMBULATORY_CARE_PROVIDER_SITE_OTHER): Payer: Federal, State, Local not specified - PPO | Admitting: Clinical

## 2022-02-16 DIAGNOSIS — F32A Depression, unspecified: Secondary | ICD-10-CM

## 2022-02-16 DIAGNOSIS — F4310 Post-traumatic stress disorder, unspecified: Secondary | ICD-10-CM

## 2022-02-16 DIAGNOSIS — F419 Anxiety disorder, unspecified: Secondary | ICD-10-CM
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
## 2022-02-19 ENCOUNTER — Other Ambulatory Visit: Payer: Self-pay | Admitting: Family Medicine

## 2022-02-22 ENCOUNTER — Other Ambulatory Visit (HOSPITAL_COMMUNITY): Payer: Self-pay | Admitting: Psychiatry

## 2022-02-22 DIAGNOSIS — R4587 Impulsiveness: Secondary | ICD-10-CM

## 2022-02-22 DIAGNOSIS — F5104 Psychophysiologic insomnia: Secondary | ICD-10-CM

## 2022-02-22 DIAGNOSIS — F4310 Post-traumatic stress disorder, unspecified: Secondary | ICD-10-CM

## 2022-02-22 DIAGNOSIS — F331 Major depressive disorder, recurrent, moderate: Secondary | ICD-10-CM

## 2022-03-02 ENCOUNTER — Encounter: Payer: Self-pay | Admitting: Family Medicine

## 2022-03-04 ENCOUNTER — Encounter (HOSPITAL_COMMUNITY): Payer: Self-pay | Admitting: Psychiatry

## 2022-03-04 ENCOUNTER — Telehealth (HOSPITAL_COMMUNITY): Payer: Federal, State, Local not specified - PPO | Admitting: Psychiatry

## 2022-03-04 ENCOUNTER — Encounter (HOSPITAL_COMMUNITY): Payer: Self-pay

## 2022-03-04 DIAGNOSIS — F331 Major depressive disorder, recurrent, moderate: Secondary | ICD-10-CM

## 2022-03-04 DIAGNOSIS — G8929 Other chronic pain: Secondary | ICD-10-CM | POA: Diagnosis not present

## 2022-03-04 DIAGNOSIS — R4587 Impulsiveness: Secondary | ICD-10-CM

## 2022-03-04 DIAGNOSIS — F5104 Psychophysiologic insomnia: Secondary | ICD-10-CM | POA: Diagnosis not present

## 2022-03-04 DIAGNOSIS — F4310 Post-traumatic stress disorder, unspecified: Secondary | ICD-10-CM

## 2022-03-04 DIAGNOSIS — F17210 Nicotine dependence, cigarettes, uncomplicated: Secondary | ICD-10-CM

## 2022-03-04 DIAGNOSIS — F17200 Nicotine dependence, unspecified, uncomplicated: Secondary | ICD-10-CM

## 2022-03-04 MED ORDER — DIVALPROEX SODIUM ER 250 MG PO TB24: 250.0000 mg | ORAL_TABLET | Freq: Every day | ORAL | 0 refills | Status: DC

## 2022-03-04 MED ORDER — DIVALPROEX SODIUM ER 250 MG PO TB24: 250.0000 mg | ORAL_TABLET | Freq: Every evening | ORAL | 0 refills | Status: DC
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
## 2022-03-04 NOTE — Patient Instructions (Signed)
We fully discontinued your lamotrigine today. To replace this, we started depakote ER '250mg'$  nightly which should help with your mood, chronic pain, and insomnia. Dr. Nehemiah Settle has also sent a mychart message for you to be able to respond to.

## 2022-03-05 ENCOUNTER — Other Ambulatory Visit: Payer: Self-pay | Admitting: Family Medicine

## 2022-03-05 DIAGNOSIS — F411 Generalized anxiety disorder: Secondary | ICD-10-CM

## 2022-03-05 DIAGNOSIS — F322 Major depressive disorder, single episode, severe without psychotic features: Secondary | ICD-10-CM

## 2022-03-06 MED ORDER — ALPRAZOLAM 0.25 MG PO TABS: ORAL_TABLET | ORAL | 0 refills | Status: DC

## 2022-03-08 ENCOUNTER — Other Ambulatory Visit: Payer: Self-pay | Admitting: Family Medicine

## 2022-03-08 MED ORDER — BUDESONIDE-FORMOTEROL FUMARATE 80-4.5 MCG/ACT IN AERO: 2.0000 | INHALATION_SPRAY | Freq: Two times a day (BID) | RESPIRATORY_TRACT | 5 refills | Status: DC

## 2022-03-08 MED ORDER — TIOTROPIUM BROMIDE MONOHYDRATE 18 MCG IN CAPS: ORAL_CAPSULE | RESPIRATORY_TRACT | 5 refills | Status: DC

## 2022-03-08 NOTE — Telephone Encounter (Signed)
Medications were sent as requested

## 2022-03-16 ENCOUNTER — Telehealth (INDEPENDENT_AMBULATORY_CARE_PROVIDER_SITE_OTHER): Payer: Federal, State, Local not specified - PPO | Admitting: Clinical

## 2022-03-16 DIAGNOSIS — F331 Major depressive disorder, recurrent, moderate: Secondary | ICD-10-CM

## 2022-03-16 DIAGNOSIS — F419 Anxiety disorder, unspecified: Secondary | ICD-10-CM

## 2022-03-16 NOTE — Progress Notes (Signed)
Virtual Visit via Video Note   I connected with [name] on 03/16/22 at  3:00 PM EDT by a video enabled telemedicine application and verified that I am speaking with the correct person using two identifiers.   Location: Patient: Home Provider: Office   I discussed the limitations of evaluation and management by telemedicine and the availability of in person appointments. The patient expressed understanding and agreed to proceed.       THERAPIST PROGRESS NOTE   Session Time: 3:00 PM-3:45 PM   Participation Level: Active   Behavioral Response: CasualAlertAnxious   Type of Therapy: Individual Therapy   Treatment Goals addressed: Anger and Coping   Interventions: CBT, Motivational Interviewing, Solution Focused and Strength-based   Summary: [name] is a 56 y.o. female who presents with GAD and ADHD. The OPT therapist worked with the patient for her ongoing OPT treatment. The OPT therapist utilized Motivational Interviewing to assist in creating therapeutic repore. The patient in the session was engaged and work in collaboration giving feedback about her triggers and symptoms over the past few weeks. The patient spoke about her relationship and the guy she is currently seeing who lives in [place] and is on partial disability with limited finances; The patient verbalized ," I don't need a lot I am not materialistic and I make good money and don't need a lot of gifts I  don't expect him to wine and dine me, but  I went to see him in [place] and paid or my trip and my food, so next we have plans for him to come visit me and he was going to borrow the money for the plan ticket , but then I find out he is borrowing money but for other things, ok and then we had plans to meet in [place] but then he was complaining about Anxiety and not wanting to drive to and so I said jokingly are we going to have to come pick you up.So we had to go to [place] and then [place] and back to [place] and  home. Then he tells me he just bought a truck, but when he went with [place] he didn't even offer to pay for a tank of gas". The patient spoke about being upset with the boyfriend who has to pay car payments of around $500 a month and car insurance, but could not pay to come see her.The patient in this session spoke about' \\adjusting'$  her medication recently with Dr. Nehemiah Settle. The patient spoke about recently going [place] the hippie [place] over past weekend with friends. The patient reported that she is concerned about more ongoing difficulty with memory as noted by recently losing things / misplacing things. The patient spoke about having a few manic episodes over the past few weeks.The OPT therapist worked in the session with the patient on challenging negative thoughts and implementing positive thinking. The OPT therapist continued in this session working with the patient on mood management, monitoring baseline, and communication strategies as well as the patient adjustment post recently seeing Dr. Nehemiah Settle and trying new medication.   Suicidal/Homicidal: Nowithout intent/plan   Therapist Response: The OPT therapist worked with the patient for the patients scheduled session. The patient was engaged in her session and gave feedback in relation to triggers, symptoms, and behavior responses over the past few weeks. The OPT therapist worked with the patient utilizing an in session Cognitive Behavioral Therapy exercise. The patient was responsive in the session and verbalized, " I saw Dr.  Stinson and I am starting new medicine, but I am still worried about my memory I [place] [employer] [place] so I went and was with friends but I keep losing things I lost my drink, then my phone, and then my wallet and thankfully I found them all but it was everytime I turned around I was losing things and like at my home just today I was talking with my friend and I lost my glasses. My friends are telling me I seem to be spacing  out and they notice it and said I keep doing it".  The OPT therapist worked with the patient on her reactive/ impulsive behaviors.The OPT therapist worked with the patient on moderation and implementing a pause when she gets frustrated or overwhelmed. The patient spoke about realizing the need for work on implementing her pause button. The patient spoke about concern that she may be getting Alzheimer/ Dementia. The OPT therapist reviewed the importance of continuing to be consistent with medication therapy and all recommendations and scheduled health appointments.   Plan: Return again in 2/3 weeks.   Diagnosis:      Axis I: Depression w Anxiety                           Axis II: No diagnosis     Collaboration of Care: The OPT therapist reviewed/overviewed patient involvement in med therapy with Dr. Nehemiah Settle.   Patient/Guardian was advised Release of Information must be obtained prior to any record release in order to collaborate their care with an outside provider. Patient/Guardian was advised if they have not already done so to contact the registration department to sign all necessary forms in order for [place] to release information regarding their care.    Consent: Patient/Guardian gives verbal consent for treatment and assignment of benefits for services provided during this visit. Patient/Guardian expressed understanding and agreed to proceed.      I discussed the assessment and treatment plan with the patient. The patient was provided an opportunity to ask questions and all were answered. The patient agreed with the plan and demonstrated an understanding of the instructions.   The patient was advised to call back or seek an in-person evaluation if the symptoms worsen or if the condition fails to improve as anticipated.   I provided 45 minutes of non-face-to-face time during this encounter.   Samiya Mervin T. Eulas Post, [place]   03/16/2022

## 2022-03-18 ENCOUNTER — Telehealth (INDEPENDENT_AMBULATORY_CARE_PROVIDER_SITE_OTHER): Payer: Federal, State, Local not specified - PPO | Admitting: Psychiatry

## 2022-03-18 DIAGNOSIS — F5104 Psychophysiologic insomnia: Secondary | ICD-10-CM

## 2022-03-18 DIAGNOSIS — F17200 Nicotine dependence, unspecified, uncomplicated: Secondary | ICD-10-CM

## 2022-03-18 DIAGNOSIS — G8929 Other chronic pain: Secondary | ICD-10-CM

## 2022-03-18 DIAGNOSIS — F331 Major depressive disorder, recurrent, moderate: Secondary | ICD-10-CM | POA: Diagnosis not present

## 2022-03-18 DIAGNOSIS — F411 Generalized anxiety disorder: Secondary | ICD-10-CM | POA: Diagnosis not present

## 2022-03-18 DIAGNOSIS — Z5181 Encounter for therapeutic drug level monitoring: Secondary | ICD-10-CM | POA: Insufficient documentation

## 2022-03-18 DIAGNOSIS — F4310 Post-traumatic stress disorder, unspecified: Secondary | ICD-10-CM | POA: Diagnosis not present

## 2022-03-18 DIAGNOSIS — F17210 Nicotine dependence, cigarettes, uncomplicated: Secondary | ICD-10-CM

## 2022-03-18 DIAGNOSIS — R4587 Impulsiveness: Secondary | ICD-10-CM

## 2022-03-18 HISTORY — DX: Encounter for therapeutic drug level monitoring: Z51.81

## 2022-03-18 MED ORDER — DIVALPROEX SODIUM ER 500 MG PO TB24: 500.0000 mg | ORAL_TABLET | Freq: Every day | ORAL | 0 refills | Status: DC
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
## 2022-03-18 NOTE — Patient Instructions (Addendum)
We increased your depakote to '500mg'$  at night. Dr. Nehemiah Settle will message your PCP about doing a specific memory test called a MOCA and can coordinate getting a depakote level at the time of your visit; once you know your appointment time, aim to take the night time dose of depakote 12 hours before the appointment for the most accurate level. As we discussed, if you want to pursue specific ADHD testing you can reach out to: [facility] ([facility] of [facility]) [phone] has multiple providers. [facility] [phone] has several providers specializing in ADHD/Bipolar= Jillene Bucks, PhD is expert at Adult ADHD, Darryl Nestle, PhD treats adults with both diagnoses. Keep up the good work in therapy, you are improving!

## 2022-03-23 ENCOUNTER — Telehealth: Payer: Self-pay | Admitting: Family Medicine

## 2022-03-23 DIAGNOSIS — E785 Hyperlipidemia, unspecified: Secondary | ICD-10-CM

## 2022-03-23 DIAGNOSIS — F411 Generalized anxiety disorder: Secondary | ICD-10-CM

## 2022-03-23 DIAGNOSIS — Z79899 Other long term (current) drug therapy: Secondary | ICD-10-CM

## 2022-03-23 NOTE — Telephone Encounter (Signed)
Left message for patient to return the call for additional details and recommendations.  See previous drs note and relay to patient. Lab orders placed

## 2022-03-23 NOTE — Telephone Encounter (Signed)
Left message for patient to return the call for additional details and recommendations.   

## 2022-03-23 NOTE — Telephone Encounter (Signed)
   Nurses-please talk with patient.  Recently she saw specialist Dr. Sheran Lawless he relayed to [place] that the patient was concerned about her cognitive functions.  He is requested that we see her.  We are more than willing for further discussion of this.  I would recommend scheduling an office visit with myself somewhere in the future weeks.  Also patient is due for lipid, liver, metabolic 7 due to high risk meds as well as hyperlipidemia plus also valproic acid level.  It is requested that this blood work to be done approximately 12 hours after taking her valproic acid tablet  Please have patient go ahead with her blood work.  She can schedule a follow-up office visit with me somewhere in the coming weeks-nonemergent     Nehemiah Settle, Arlan Organ, MD  Kathyrn Drown, MD Dr. Wolfgang Phoenix,   Annah has been slightly more forgetful of late and I think we could reassure her by doing a [place] test, would your office be able to administer since I am only telehealth? I was thinking we could coordinate that with getting a valproic acid level, we will increase today so any day following Saturday would work; we would aim for 12 hours after she takes it at night.   Thanks,  Sam

## 2022-03-31 NOTE — Telephone Encounter (Signed)
Pt contacted. Pt states that she believes she has ADHD not memory issues. States she previously talked with Dr.Scott. Pt states the psychologist diagnosed her with PTSD and she does not believe she is PTSD and she is unsure of where he came up with that diagnosis. Pt states she is not sure why she is Depakote. Please advise. Thank you.

## 2022-04-01 ENCOUNTER — Encounter (HOSPITAL_COMMUNITY): Payer: Self-pay | Admitting: Psychiatry

## 2022-04-01 ENCOUNTER — Telehealth (INDEPENDENT_AMBULATORY_CARE_PROVIDER_SITE_OTHER): Payer: Federal, State, Local not specified - PPO | Admitting: Psychiatry

## 2022-04-01 DIAGNOSIS — G8929 Other chronic pain: Secondary | ICD-10-CM

## 2022-04-01 DIAGNOSIS — F411 Generalized anxiety disorder: Secondary | ICD-10-CM | POA: Diagnosis not present

## 2022-04-01 DIAGNOSIS — F332 Major depressive disorder, recurrent severe without psychotic features: Secondary | ICD-10-CM | POA: Diagnosis not present

## 2022-04-01 DIAGNOSIS — F4310 Post-traumatic stress disorder, unspecified: Secondary | ICD-10-CM

## 2022-04-01 DIAGNOSIS — Z5181 Encounter for therapeutic drug level monitoring: Secondary | ICD-10-CM

## 2022-04-01 DIAGNOSIS — F5104 Psychophysiologic insomnia: Secondary | ICD-10-CM

## 2022-04-01 DIAGNOSIS — R4587 Impulsiveness: Secondary | ICD-10-CM

## 2022-04-01 DIAGNOSIS — F17200 Nicotine dependence, unspecified, uncomplicated: Secondary | ICD-10-CM

## 2022-04-01 NOTE — Patient Instructions (Signed)
We made changes to your medication today. Decrease escitalopram to '20mg'$  once daily x1 week then decrease to '10mg'$  (half a tablet) x1 week and once we have our follow up with start cymbalta. Do try to have your depakote level drawn in the coming two weeks so we can continue to adjust this medication for your pain and depression. Consider the intensive outpatient option to have a more frequent psychotherapy during this time of increased stress/depression.
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
## 2022-04-05 NOTE — Telephone Encounter (Signed)
So all of this goes beyond my area of expertise.  I would recommend she does a follow-up visit with a psychiatrist regarding her concerns regarding PTSD diagnosis as well as ADHD diagnosis.  Both of these areas are not something that I will be prescribing for  If she does feel she is having any cognitive issues in regards to memory issues we can help set up with neurology for further consultation

## 2022-04-07 ENCOUNTER — Ambulatory Visit (INDEPENDENT_AMBULATORY_CARE_PROVIDER_SITE_OTHER): Payer: Federal, State, Local not specified - PPO | Admitting: Clinical

## 2022-04-07 DIAGNOSIS — F331 Major depressive disorder, recurrent, moderate: Secondary | ICD-10-CM | POA: Diagnosis not present

## 2022-04-07 DIAGNOSIS — F419 Anxiety disorder, unspecified: Secondary | ICD-10-CM

## 2022-04-07 NOTE — Telephone Encounter (Signed)
Attempted to call pt , no answer left vm for cb  

## 2022-04-07 NOTE — Progress Notes (Signed)
Virtual Visit via Telephone Note  I connected with [name] on 04/07/22 at  2:00 PM EST by telephone and verified that I am speaking with the correct person using two identifiers.  Location: Patient: Home Provider: Office   I discussed the limitations, risks, security and privacy concerns of performing an evaluation and management service by telephone and the availability of in person appointments. I also discussed with the patient that there may be a patient responsible charge related to this service. The patient expressed understanding and agreed to proceed.  THERAPIST PROGRESS NOTE   Session Time: 2:00 PM-2:45 PM   Participation Level: Active   Behavioral Response: CasualAlertAnxious   Type of Therapy: Individual Therapy   Treatment Goals addressed: Anger and Coping   Interventions: CBT, Motivational Interviewing, Solution Focused and Strength-based   Summary: [name] is a 56 y.o. female who presents with GAD and ADHD. The OPT therapist worked with the patient for her ongoing OPT treatment. The OPT therapist utilized Motivational Interviewing to assist in creating therapeutic repore. The patient in the session was engaged and work in collaboration giving feedback about her triggers and symptoms over the past few weeks. The patient in this session spoke about' \\adjusting'$  her medication recently with Dr. Nehemiah Settle. The patient spoke about recently going through med withdrawal but it not being unmanageable.The OPT therapist worked in the session with the patient on challenging negative thoughts and implementing positive thinking. The OPT therapist continued in this session working with the patient on mood management, monitoring baseline, and communication strategies.   Suicidal/Homicidal: Nowithout intent/plan   Therapist Response: The OPT therapist worked with the patient for the patients scheduled session. The patient was engaged in her session and gave feedback  in relation to triggers, symptoms, and behavior responses over the past few weeks. The OPT therapist worked with the patient utilizing an in session Cognitive Behavioral Therapy exercise. The patient was responsive in the session and verbalized, " I am working through coming off my Lexapro and transitioning to Cymbalta".  The OPT therapist worked with the patient on her reactive/ impulsive behaviors.The OPT therapist worked with the patient on moderation and implementing a pause when she gets frustrated or overwhelmed. The patient spoke about realizing the need for work on implementing her pause button. The patient spoke about concern around fixation and impulsive behavior. The patient spoke about improvements in her relationship with her brother.This has helped the patient be more empathetic and not as standoffish and more open to others help. The OPT therapist reviewed the importance of continuing to be consistent with medication therapy and all recommendations and scheduled health appointments.   Plan: Return again in 2/3 weeks.   Diagnosis:      Axis I: Depression w Anxiety                           Axis II: No diagnosis     Collaboration of Care: The OPT therapist reviewed/overviewed patient involvement in med therapy with Dr. Nehemiah Settle.   Patient/Guardian was advised Release of Information must be obtained prior to any record release in order to collaborate their care with an outside provider. Patient/Guardian was advised if they have not already done so to contact the registration department to sign all necessary forms in order for [place] to release information regarding their care.    Consent: Patient/Guardian gives verbal consent for treatment and assignment of benefits for services provided during this visit. Patient/Guardian  expressed understanding and agreed to proceed.      I discussed the assessment and treatment plan with the patient. The patient was provided an opportunity to ask questions  and all were answered. The patient agreed with the plan and demonstrated an understanding of the instructions.   The patient was advised to call back or seek an in-person evaluation if the symptoms worsen or if the condition fails to improve as anticipated.   I provided 45 minutes of non-face-to-face time during this encounter.   Alphonsa Brickle T. Eulas Post, [place]   04/07/2022

## 2022-04-09 NOTE — Telephone Encounter (Signed)
Mychart message sent to patient.

## 2022-04-15 ENCOUNTER — Telehealth (INDEPENDENT_AMBULATORY_CARE_PROVIDER_SITE_OTHER): Payer: Federal, State, Local not specified - PPO | Admitting: Psychiatry

## 2022-04-15 ENCOUNTER — Encounter (HOSPITAL_COMMUNITY): Payer: Self-pay | Admitting: Psychiatry

## 2022-04-15 DIAGNOSIS — F4310 Post-traumatic stress disorder, unspecified: Secondary | ICD-10-CM | POA: Diagnosis not present

## 2022-04-15 DIAGNOSIS — F603 Borderline personality disorder: Secondary | ICD-10-CM

## 2022-04-15 DIAGNOSIS — F5104 Psychophysiologic insomnia: Secondary | ICD-10-CM

## 2022-04-15 DIAGNOSIS — F331 Major depressive disorder, recurrent, moderate: Secondary | ICD-10-CM

## 2022-04-15 DIAGNOSIS — F17200 Nicotine dependence, unspecified, uncomplicated: Secondary | ICD-10-CM

## 2022-04-15 DIAGNOSIS — F332 Major depressive disorder, recurrent severe without psychotic features: Secondary | ICD-10-CM | POA: Diagnosis not present

## 2022-04-15 DIAGNOSIS — Z5181 Encounter for therapeutic drug level monitoring: Secondary | ICD-10-CM

## 2022-04-15 DIAGNOSIS — F411 Generalized anxiety disorder: Secondary | ICD-10-CM

## 2022-04-15 DIAGNOSIS — F17210 Nicotine dependence, cigarettes, uncomplicated: Secondary | ICD-10-CM

## 2022-04-15 DIAGNOSIS — G8929 Other chronic pain: Secondary | ICD-10-CM

## 2022-04-15 DIAGNOSIS — R4587 Impulsiveness: Secondary | ICD-10-CM

## 2022-04-15 MED ORDER — DIVALPROEX SODIUM ER 500 MG PO TB24: 500.0000 mg | ORAL_TABLET | Freq: Every day | ORAL | 0 refills | Status: DC

## 2022-04-15 MED ORDER — DULOXETINE HCL 30 MG PO CPEP: ORAL_CAPSULE | ORAL | 0 refills | Status: DC

## 2022-04-15 NOTE — Telephone Encounter (Signed)
Per Epic pt read my chart message

## 2022-04-15 NOTE — Patient Instructions (Addendum)
We discontinued lexapro today. You can go on and start cymbalta (duloxetine) at '30mg'$  daily for the next week, then increase to 2 capsules ('60mg'$ ) daily for the next week. Try to go by the clinic before your next appointment to get your depakote level drawn 12 hours after your night time dose.  This is the entire DBT workbook, try looking through here and practicing the skills they suggest: AffordableShare.co.za.pdf  On a smaller scale, here is a link to worksheets: [email or web address]://www.therapistaid.com/therapy-worksheet/dbt-improve/dbt/none
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
## 2022-04-29 ENCOUNTER — Encounter (HOSPITAL_COMMUNITY): Payer: Self-pay | Admitting: Psychiatry

## 2022-04-29 ENCOUNTER — Telehealth (INDEPENDENT_AMBULATORY_CARE_PROVIDER_SITE_OTHER): Payer: Federal, State, Local not specified - PPO | Admitting: Psychiatry

## 2022-04-29 DIAGNOSIS — F4310 Post-traumatic stress disorder, unspecified: Secondary | ICD-10-CM | POA: Diagnosis not present

## 2022-04-29 DIAGNOSIS — F331 Major depressive disorder, recurrent, moderate: Secondary | ICD-10-CM

## 2022-04-29 DIAGNOSIS — F5104 Psychophysiologic insomnia: Secondary | ICD-10-CM

## 2022-04-29 DIAGNOSIS — G8929 Other chronic pain: Secondary | ICD-10-CM

## 2022-04-29 DIAGNOSIS — F411 Generalized anxiety disorder: Secondary | ICD-10-CM

## 2022-04-29 DIAGNOSIS — F332 Major depressive disorder, recurrent severe without psychotic features: Secondary | ICD-10-CM

## 2022-04-29 DIAGNOSIS — F603 Borderline personality disorder: Secondary | ICD-10-CM

## 2022-04-29 MED ORDER — DIVALPROEX SODIUM ER 500 MG PO TB24: 500.0000 mg | ORAL_TABLET | Freq: Every day | ORAL | 1 refills | Status: DC

## 2022-04-29 MED ORDER — DULOXETINE HCL 30 MG PO CPEP: 90.0000 mg | ORAL_CAPSULE | Freq: Every day | ORAL | 1 refills | Status: DC

## 2022-04-29 NOTE — Patient Instructions (Signed)
Keep up the good work. We will increase your cymbalta to '90mg'$  (3 capsules) once daily. Try to swing by the clinic to get your valproic acid level drawn 12hrs after your night time dose when you can. Here is that link for Healthy at Every Size: https://education.[employer]ight[employer].no
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
## 2022-05-11 ENCOUNTER — Ambulatory Visit (HOSPITAL_COMMUNITY): Payer: Federal, State, Local not specified - PPO | Admitting: Clinical

## 2022-05-11 DIAGNOSIS — F331 Major depressive disorder, recurrent, moderate: Secondary | ICD-10-CM | POA: Diagnosis not present

## 2022-05-11 DIAGNOSIS — F419 Anxiety disorder, unspecified: Secondary | ICD-10-CM

## 2022-05-11 NOTE — Progress Notes (Signed)
Virtual Visit via Video Note  I connected with [name] on 05/11/22 at  2:00 PM EST by a video enabled telemedicine application and verified that I am speaking with the correct person using two identifiers.  Location: Patient: Home Provider: Office   I discussed the limitations of evaluation and management by telemedicine and the availability of in person appointments. The patient expressed understanding and agreed to proceed.  THERAPIST PROGRESS NOTE   Session Time: 2:00 PM-2:45 PM   Participation Level: Active   Behavioral Response: CasualAlertAnxious   Type of Therapy: Individual Therapy   Treatment Goals addressed: Anger and Coping   Interventions: CBT, Motivational Interviewing, Solution Focused and Strength-based   Summary: [name] is a 56 y.o. female who presents with GAD and ADHD. The OPT therapist worked with the patient for her ongoing OPT treatment. The OPT therapist utilized Motivational Interviewing to assist in creating therapeutic repore. The patient in the session was engaged and work in collaboration giving feedback about her triggers and symptoms over the past few weeks. The patient spoke about a recent break up with her boyfriend. The patient spoke about feeling like she was not the one for her boyfriend and these feelings building and her starting to feel like the boyfriend was "acting different " Noting the vibe with the boyfriend was completely different in person than there talks over the phone noting the patient was acting distant and not happy to see her or be around her. The patient spoke about ongoing work with Dr. Nehemiah Settle in her med management program.The OPT therapist worked in the session with the patient on challenging negative self defeating thoughts and implementing positive thinking. The OPT therapist continued in this session working with the patient on mood management, monitoring baseline, and communication strategies and self  check ins. The OPT therapist over-viewed with the patient the plans around upcoming Christmas holiday.   Suicidal/Homicidal: Nowithout intent/plan   Therapist Response: The OPT therapist worked with the patient for the patients scheduled session. The patient was engaged in her session and gave feedback in relation to triggers, symptoms, and behavior responses over the past few weeks. The OPT therapist worked with the patient utilizing an in session Cognitive Behavioral Therapy exercise. The patient was responsive in the session and verbalized, " we went all the way to [place] to pick him up for a concert and in person he didn't even act happy to see me and he acted like I wasn't enough and he made statements like he had other friends going to the concert but they are all VIP and he didn't like introduce me to his friends until like 10 minutes after [place] talking with his friends".  The patient noted this made her feel angry,upset, confused. The patient noted after this once back home and talking over the phone she told him how she felt about the situation but the boyfriends behaviors got to a breaking point. The patient noted things just continued to not add up in the relationship including the patient telling her he had no money and was on disability but then later telling her he bought a new truck. The OPT therapist worked with the patient on her reactive/ impulsive behaviors, communication, however, acknowledged given the situation understanding her mixed emotions from the relationship and the break up. The patient verbalized her realization that she does not need to go back to the relationship and its healthier for both her and the ex boyfriend to move on. The patient  spoke about her realization of needing to focus on her health and not on being in a relationship and she jumped into this relationship to fast, but admits she does not like to be alone and wants companionship. The patient spoke about the next  upcoming event for her will be celebrating her Mothers birthday, Christmas, and New Years. The patient noted she will be working Christmas Day but will be spending time with her Mother daughter and granddaughter. The patient spoke about her current Med Management and noted , " My medicine is working and I feel better overall, I still am not sure where my inner anger comes from". The OPT therapist reviewed the importance of continuing to be consistent with medication therapy and all recommendations and scheduled health appointments.   Plan: Return again in 2/3 weeks.   Diagnosis:      Axis I: Depression w Anxiety                           Axis II: No diagnosis     Collaboration of Care: The OPT therapist reviewed/overviewed patient involvement in med therapy with Dr. Nehemiah Settle.   Patient/Guardian was advised Release of Information must be obtained prior to any record release in order to collaborate their care with an outside provider. Patient/Guardian was advised if they have not already done so to contact the registration department to sign all necessary forms in order for [place] to release information regarding their care.    Consent: Patient/Guardian gives verbal consent for treatment and assignment of benefits for services provided during this visit. Patient/Guardian expressed understanding and agreed to proceed.      I discussed the assessment and treatment plan with the patient. The patient was provided an opportunity to ask questions and all were answered. The patient agreed with the plan and demonstrated an understanding of the instructions.   The patient was advised to call back or seek an in-person evaluation if the symptoms worsen or if the condition fails to improve as anticipated.   I provided 45 minutes of non-face-to-face time during this encounter.   [name] T. [address], [place]   05/11/2022

## 2022-05-28 ENCOUNTER — Telehealth (INDEPENDENT_AMBULATORY_CARE_PROVIDER_SITE_OTHER): Payer: Federal, State, Local not specified - PPO | Admitting: Psychiatry

## 2022-05-28 ENCOUNTER — Encounter (HOSPITAL_COMMUNITY): Payer: Self-pay | Admitting: Psychiatry

## 2022-05-28 DIAGNOSIS — F5104 Psychophysiologic insomnia: Secondary | ICD-10-CM

## 2022-05-28 DIAGNOSIS — F331 Major depressive disorder, recurrent, moderate: Secondary | ICD-10-CM

## 2022-05-28 DIAGNOSIS — F4310 Post-traumatic stress disorder, unspecified: Secondary | ICD-10-CM | POA: Diagnosis not present

## 2022-05-28 DIAGNOSIS — G8929 Other chronic pain: Secondary | ICD-10-CM

## 2022-05-28 DIAGNOSIS — F603 Borderline personality disorder: Secondary | ICD-10-CM | POA: Diagnosis not present

## 2022-05-28 DIAGNOSIS — F411 Generalized anxiety disorder: Secondary | ICD-10-CM | POA: Diagnosis not present

## 2022-05-28 DIAGNOSIS — F17200 Nicotine dependence, unspecified, uncomplicated: Secondary | ICD-10-CM

## 2022-05-28 DIAGNOSIS — Z5181 Encounter for therapeutic drug level monitoring: Secondary | ICD-10-CM

## 2022-05-28 DIAGNOSIS — F17210 Nicotine dependence, cigarettes, uncomplicated: Secondary | ICD-10-CM

## 2022-05-28 MED ORDER — DULOXETINE HCL 30 MG PO CPEP: 90.0000 mg | ORAL_CAPSULE | Freq: Every day | ORAL | 1 refills | Status: DC

## 2022-05-28 MED ORDER — DIVALPROEX SODIUM ER 500 MG PO TB24: 500.0000 mg | ORAL_TABLET | Freq: Every day | ORAL | 1 refills | Status: DC

## 2022-05-28 NOTE — Patient Instructions (Addendum)
No medication changes today.  Keep up the good work in psychotherapy.  Hears the feelings we will that we discussed: [email or web address]   And here is the cognitive distortions worksheet we talked about: [email or web address]
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
## 2022-06-08 ENCOUNTER — Ambulatory Visit (INDEPENDENT_AMBULATORY_CARE_PROVIDER_SITE_OTHER): Payer: Federal, State, Local not specified - PPO | Admitting: Clinical

## 2022-06-08 DIAGNOSIS — F32A Depression, unspecified: Secondary | ICD-10-CM | POA: Diagnosis not present

## 2022-06-08 DIAGNOSIS — F419 Anxiety disorder, unspecified: Secondary | ICD-10-CM | POA: Diagnosis not present

## 2022-06-08 DIAGNOSIS — F331 Major depressive disorder, recurrent, moderate: Secondary | ICD-10-CM

## 2022-06-08 NOTE — Progress Notes (Signed)
Virtual Visit via Video Note   I connected with [name] on 06/08/22 at  2:00 PM EST by a video enabled telemedicine application and verified that I am speaking with the correct person using two identifiers.   Location: Patient: Home Provider: Office   I discussed the limitations of evaluation and management by telemedicine and the availability of in person appointments. The patient expressed understanding and agreed to proceed.   THERAPIST PROGRESS NOTE   Session Time: 2:00 PM-2:45 PM   Participation Level: Active   Behavioral Response: CasualAlertAnxious   Type of Therapy: Individual Therapy   Treatment Goals addressed: Anger and Coping   Interventions: CBT, Motivational Interviewing, Solution Focused and Strength-based   Summary: [name] is a 57 y.o. female who presents with GAD and ADHD. The OPT therapist worked with the patient for her ongoing OPT treatment. The OPT therapist utilized Motivational Interviewing to assist in creating therapeutic repore. The patient in the session was engaged and work in collaboration giving feedback about her triggers and symptoms over the past few weeks.  The patient spoke about ongoing pain through her sholders from which she is going to see her PCP for ongoing pain management evaluation.The patient spoke about talking to a new person she is interested in and has been talking to over the past few weeks. The patient spoke about feeling like she wants to take time with this new person because she has had a history of jumping into relationships too fast and them note working out.The OPT therapist worked in the session with the patient on challenging negative self defeating thoughts and implementing positive thinking. The OPT therapist continued in this session working with the patient on mood management, monitoring baseline, and communication strategies and self check ins. 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 The patient notes currently things are going well with her work and she is managing her work stress well.   Suicidal/Homicidal: Nowithout intent/plan   Therapist Response: The OPT therapist worked with the patient for the patients scheduled session. The patient was engaged in her session and gave feedback in relation to triggers, symptoms, and behavior responses over the past few weeks. The OPT therapist worked with the patient utilizing an in session Cognitive Behavioral Therapy exercise. The patient was responsive in the session and verbalized, " I am talking to someone new and we seem to have a lot in common but I am going to take things slow cause I know I tend to rush into things".  The patient spoke about improving her ability to let things go at work and is not currently having as much difficulty with work related stress. The patient notes, " The anger is better I think in part because my medicine is working". The patient spoke about having a more carefree approach. The OPT therapist reviewed the importance of continuing to be consistent with medication therapy and all recommendations and scheduled health appointments. The patient is schedule for med therapy follow up Jan 06/29/2022.   Plan: Return again in 2/3 weeks.   Diagnosis:      Axis I: Depression w Anxiety                           Axis II: No diagnosis     Collaboration of Care: The OPT therapist reviewed/overviewed patient involvement in med therapy with  Dr. Nehemiah Settle.   Patient/Guardian was advised Release of Information must be obtained prior to any record release in order to collaborate their care with an outside provider. Patient/Guardian was advised if they have not already done so to contact the registration department to sign all necessary forms in order for [name] to release information regarding their care.    Consent: Patient/Guardian gives verbal consent for treatment  and assignment of benefits for services provided during this visit. Patient/Guardian expressed understanding and agreed to proceed.      I discussed the assessment and treatment plan with the patient. The patient was provided an opportunity to ask questions and all were answered. The patient agreed with the plan and demonstrated an understanding of the instructions.   The patient was advised to call back or seek an in-person evaluation if the symptoms worsen or if the condition fails to improve as anticipated.   I provided 45 minutes of non-face-to-face time during this encounter.   Gracielynn Birkel T. Eulas Post, [place]   06/08/2022

## 2022-06-29 ENCOUNTER — Encounter (HOSPITAL_COMMUNITY): Payer: Self-pay | Admitting: Psychiatry

## 2022-06-29 ENCOUNTER — Telehealth (INDEPENDENT_AMBULATORY_CARE_PROVIDER_SITE_OTHER): Payer: Federal, State, Local not specified - PPO | Admitting: Psychiatry

## 2022-06-29 DIAGNOSIS — F4310 Post-traumatic stress disorder, unspecified: Secondary | ICD-10-CM | POA: Diagnosis not present

## 2022-06-29 DIAGNOSIS — F603 Borderline personality disorder: Secondary | ICD-10-CM | POA: Diagnosis not present

## 2022-06-29 DIAGNOSIS — F17210 Nicotine dependence, cigarettes, uncomplicated: Secondary | ICD-10-CM

## 2022-06-29 DIAGNOSIS — F411 Generalized anxiety disorder: Secondary | ICD-10-CM

## 2022-06-29 DIAGNOSIS — F17200 Nicotine dependence, unspecified, uncomplicated: Secondary | ICD-10-CM

## 2022-06-29 DIAGNOSIS — F331 Major depressive disorder, recurrent, moderate: Secondary | ICD-10-CM | POA: Diagnosis not present

## 2022-06-29 DIAGNOSIS — F5104 Psychophysiologic insomnia: Secondary | ICD-10-CM

## 2022-06-29 DIAGNOSIS — Z5181 Encounter for therapeutic drug level monitoring: Secondary | ICD-10-CM

## 2022-06-29 DIAGNOSIS — G8929 Other chronic pain: Secondary | ICD-10-CM

## 2022-06-29 MED ORDER — DIVALPROEX SODIUM ER 500 MG PO TB24: 500.0000 mg | ORAL_TABLET | Freq: Every day | ORAL | 2 refills | Status: DC

## 2022-06-29 MED ORDER — DULOXETINE HCL 30 MG PO CPEP: 90.0000 mg | ORAL_CAPSULE | Freq: Every day | ORAL | 2 refills | Status: DC

## 2022-06-29 MED ORDER — HYDROXYZINE HCL 25 MG PO TABS: 25.0000 mg | ORAL_TABLET | Freq: Two times a day (BID) | ORAL | 0 refills | Status: DC | PRN
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
## 2022-06-29 NOTE — Patient Instructions (Signed)
We added hydroxyzine 25 mg twice daily as needed for either sleep or anxiety today.  You can switch to a non PM version of Tylenol at this point.  We otherwise kept her medications the same.  For your low motivation try using the concept of behavioral activation and starting an activity that you know you need to do rather than waiting for the motivation to come.  This is best paired with a 2-minute drill, where you give yourself a 2-minute timer to begin working on a task and after 2 minutes if you choose to stop working on it you can.

## 2022-07-06 ENCOUNTER — Ambulatory Visit (INDEPENDENT_AMBULATORY_CARE_PROVIDER_SITE_OTHER): Payer: Federal, State, Local not specified - PPO | Admitting: Clinical

## 2022-07-06 DIAGNOSIS — F331 Major depressive disorder, recurrent, moderate: Secondary | ICD-10-CM | POA: Diagnosis not present

## 2022-07-06 DIAGNOSIS — F419 Anxiety disorder, unspecified: Secondary | ICD-10-CM | POA: Diagnosis not present
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
## 2022-07-10 ENCOUNTER — Other Ambulatory Visit: Payer: Self-pay | Admitting: Family Medicine

## 2022-07-10 ENCOUNTER — Encounter: Payer: Self-pay | Admitting: Family Medicine

## 2022-07-10 MED ORDER — DICLOFENAC SODIUM 75 MG PO TBEC: DELAYED_RELEASE_TABLET | ORAL | 3 refills | Status: DC

## 2022-07-30 ENCOUNTER — Encounter: Payer: Self-pay | Admitting: Radiology

## 2022-08-04 ENCOUNTER — Other Ambulatory Visit: Payer: Self-pay | Admitting: Family Medicine

## 2022-08-04 DIAGNOSIS — Z1231 Encounter for screening mammogram for malignant neoplasm of breast: Secondary | ICD-10-CM

## 2022-08-05 ENCOUNTER — Telehealth: Payer: Self-pay | Admitting: *Deleted

## 2022-08-05 NOTE — Telephone Encounter (Signed)
I agree with what was stated.  It is best for the patient to get urgent evaluation today.  It would be fine for her to schedule a follow-up visit if she would like to do so.  Feel free to call her with that suggestion or send her a MyChart message with that suggestion she can schedule at her convenience thank you

## 2022-08-05 NOTE — Telephone Encounter (Signed)
Patient calls in a request urgent appointment for stress and anger issues. No available slot today. Patient states she is under a tremendous amount of stress at work that is causing anxiety and anger issues  Consult with Dr Nicki Reaper and he advised the patient got to [facility] walk in clinic or urgent care for immediate evaluation and can follow up with [place] as needed  Patient advised of providers recommendations and verbalized understanding.

## 2022-08-05 NOTE — Telephone Encounter (Signed)
Patient was advised during phone call to call us back for follow up

## 2022-08-10 ENCOUNTER — Ambulatory Visit (HOSPITAL_COMMUNITY): Payer: Federal, State, Local not specified - PPO | Admitting: Clinical

## 2022-08-17 ENCOUNTER — Inpatient Hospital Stay: Admission: RE | Admit: 2022-08-17 | Payer: Federal, State, Local not specified - PPO | Source: Ambulatory Visit

## 2022-08-20 ENCOUNTER — Other Ambulatory Visit: Payer: Self-pay | Admitting: Family Medicine

## 2022-08-20 DIAGNOSIS — I10 Essential (primary) hypertension: Secondary | ICD-10-CM

## 2022-09-29 ENCOUNTER — Encounter: Payer: Self-pay | Admitting: Family Medicine

## 2022-10-01 ENCOUNTER — Ambulatory Visit: Payer: Federal, State, Local not specified - PPO | Admitting: Family Medicine

## 2022-10-01 DIAGNOSIS — S91331A Puncture wound without foreign body, right foot, initial encounter: Secondary | ICD-10-CM | POA: Diagnosis not present

## 2022-10-01 DIAGNOSIS — F411 Generalized anxiety disorder: Secondary | ICD-10-CM

## 2022-10-01 DIAGNOSIS — Z23 Encounter for immunization: Secondary | ICD-10-CM | POA: Diagnosis not present

## 2022-10-01 DIAGNOSIS — T148XXA Other injury of unspecified body region, initial encounter: Secondary | ICD-10-CM | POA: Insufficient documentation

## 2022-10-01 MED ORDER — ALPRAZOLAM 0.5 MG PO TABS: 0.5000 mg | ORAL_TABLET | Freq: Two times a day (BID) | ORAL | 0 refills | Status: DC | PRN

## 2022-10-01 NOTE — Patient Instructions (Signed)
Keep the area clean - just soap and water.  Tetanus given today.  I will ask Dr. Lorin Picket about medication for the plane ride.  Take care  Dr. Adriana Simas

## 2022-10-01 NOTE — Assessment & Plan Note (Signed)
No evidence of infection.  Advised to keep clean.  Tetanus given.

## 2022-10-01 NOTE — Assessment & Plan Note (Signed)
After discussion with her primary care provider, brief supply of alprazolam given.

## 2022-10-01 NOTE — Progress Notes (Signed)
Subjective:  Patient ID: [name], female    DOB: 18-Aug-1965  Age: 57 y.o. MRN: [id number]  CC: Chief Complaint  Patient presents with   stepped on a saw blade    Saturday 4 days ago - right foot    request for nerve pill    For airplane ride    HPI:  57 year old female presents for evaluation of the above.  Patient reports that on Saturday she stepped on a saw blade (right foot). She has a small wound on the foot.  No drainage from the wound.  No fever.  No redness.  She is not up-to-date on tetanus.  Additionally, patient states that she will be taking a plane ride in the near future.  She is requesting medication for anxiety in regards to her flight.  I will discuss this with her primary care provider.  Patient Active Problem List   Diagnosis Date Noted   Puncture wound 10/01/2022   High risk medication monitoring: depakote 03/18/2022   PTSD (post-traumatic stress disorder) 02/05/2022   Psychophysiological insomnia 02/05/2022   Borderline personality disorder (HCC) 02/05/2022   Major depressive disorder, recurrent, moderate (HCC) 02/05/2022   Long term (current) use of non-steroidal anti-inflammatories (nsaid) 02/05/2022   Chronic R shoulder, hip, back pain 02/05/2022   Aortic atherosclerosis (HCC) 07/21/2021   Morbid obesity (HCC) 07/21/2021   Purpura senilis (HCC) 07/21/2021   Benign essential HTN 10/16/2020   GAD (generalized anxiety disorder) 04/07/2019   Fatty liver 06/08/2016   Diastolic dysfunction 05/04/2016   Tobacco use disorder 04/29/2016   COPD (chronic obstructive pulmonary disease) with emphysema (HCC) 12/20/2015    Social Hx   Social History   Socioeconomic History   Marital status: Divorced    Spouse name: Not on file   Number of children: Not on file   Years of education: Not on file   Highest education level: Not on file  Occupational History   Not on file  Tobacco Use   Smoking status: Every Day    Packs/day: 1.5    Types:  Cigarettes   Smokeless tobacco: Never  Vaping Use   Vaping Use: Never used  Substance and Sexual Activity   Alcohol use: Yes    Comment: None since January 2023. Previously 1-2 glasses of wine per night.   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   Sexual activity: Yes    Birth control/protection: Surgical, Post-menopausal    Comment: tubal  Other Topics Concern   Not on file  Social History Narrative   Not on file   Social Determinants of Health   Financial Resource Strain: Low Risk  (09/11/2019)   Overall Financial Resource Strain (CARDIA)    Difficulty of Paying Living Expenses: Not hard at all  Food Insecurity: No Food Insecurity (09/11/2019)   Hunger Vital Sign    Worried About Running Out of Food in the Last Year: Never true    Ran Out of Food in the Last Year: Never true  Transportation Needs: No Transportation Needs (09/11/2019)   PRAPARE - Administrator, Civil Service (Medical): No    Lack of Transportation (Non-Medical): No  Physical Activity: Sufficiently Active (09/11/2019)   Exercise Vital Sign    Days of Exercise per Week: 5 days    Minutes of Exercise per Session: 150+ min  Stress: Stress Concern Present (09/11/2019)   [employer] of Occupational Health - Occupational Stress Questionnaire  Feeling of Stress : Very much  Social Connections: Socially Isolated (09/11/2019)   Social Connection and Isolation Panel [NHANES]    Frequency of Communication with Friends and Family: Three times a week    Frequency of Social Gatherings with Friends and Family: Once a week    Attends Religious Services: Never    Diplomatic Services operational officer: No    Attends Engineer, structural: Never    Marital Status: Divorced    Review of Systems Per HPI  Objective:  BP (!) 142/70   Pulse 84   Temp (!) 97.3 F (36.3 C)   Ht 5\' 3"  (1.6 m)   Wt 237 lb (107.5 kg)   LMP 03/18/2013 (Approximate)   SpO2 97%   BMI  41.98 kg/m      10/01/2022   11:31 AM 01/19/2022   11:16 AM 01/19/2022   10:54 AM  BP/Weight  Systolic BP 142 130 134  Diastolic BP 70 74 68  Wt. (Lbs) 237    BMI 41.98 kg/m2      Physical Exam Vitals and nursing note reviewed.  Constitutional:      Appearance: Normal appearance. She is obese.  HENT:     Head: Normocephalic and atraumatic.  Pulmonary:     Effort: Pulmonary effort is normal. No respiratory distress.  Musculoskeletal:       Feet:  Feet:     Comments: Small open area noted to the right foot.  No redness.  No drainage. Neurological:     Mental Status: She is alert.  Psychiatric:        Mood and Affect: Mood normal.        Behavior: Behavior normal.     Lab Results  Component Value Date   WBC 7.5 12/27/2019   HGB 13.5 12/27/2019   HCT 40.9 12/27/2019   PLT 301 12/27/2019   GLUCOSE 86 12/27/2019   CHOL 185 01/25/2020   TRIG 152 (H) 01/25/2020   HDL 43 01/25/2020   LDLCALC 115 (H) 01/25/2020   ALT 20 02/28/2017   AST 15 02/28/2017   NA 140 12/27/2019   K 3.7 12/27/2019   CL 105 12/27/2019   CREATININE 0.59 12/27/2019   BUN 10 12/27/2019   CO2 26 12/27/2019   TSH 1.100 01/25/2020   HGBA1C 5.6 05/08/2016     Assessment & Plan:   Problem List Items Addressed This Visit       Other   GAD (generalized anxiety disorder)    After discussion with her primary care provider, brief supply of alprazolam given.      Relevant Medications   ALPRAZolam (XANAX) 0.5 MG tablet   Puncture wound    No evidence of infection.  Advised to keep clean.  Tetanus given.      Other Visit Diagnoses     Immunization due       Relevant Orders   Tdap vaccine greater than or equal to 7yo IM (Completed)       Meds ordered this encounter  Medications   ALPRAZolam (XANAX) 0.5 MG tablet    Sig: Take 1 tablet (0.5 mg total) by mouth 2 (two) times daily as needed for anxiety. Do not operate heavy machinery or use alcohol with this medication.    Dispense:  4  tablet    Refill:  0   Lejuan Botto DO [facility]

## 2022-10-19 ENCOUNTER — Telehealth: Payer: Federal, State, Local not specified - PPO | Admitting: Family Medicine

## 2022-10-19 DIAGNOSIS — B9689 Other specified bacterial agents as the cause of diseases classified elsewhere: Secondary | ICD-10-CM | POA: Diagnosis not present

## 2022-10-19 DIAGNOSIS — J208 Acute bronchitis due to other specified organisms: Secondary | ICD-10-CM

## 2022-10-19 MED ORDER — DOXYCYCLINE HYCLATE 100 MG PO TABS: 100.0000 mg | ORAL_TABLET | Freq: Two times a day (BID) | ORAL | 0 refills | Status: AC

## 2022-10-19 MED ORDER — PREDNISONE 20 MG PO TABS
40.0000 mg | ORAL_TABLET | Freq: Every day | ORAL | 0 refills | Status: AC
Start: 2022-10-19 — End: 2022-10-24

## 2022-10-19 MED ORDER — BENZONATATE 100 MG PO CAPS
100.0000 mg | ORAL_CAPSULE | Freq: Three times a day (TID) | ORAL | 0 refills | Status: DC | PRN
Start: 2022-10-19 — End: 2023-01-22

## 2022-10-19 NOTE — Patient Instructions (Signed)
[facility], thank you for joining Freddy Finner, NP for today's virtual visit.  While this provider is not your primary care provider (PCP), if your PCP is located in our provider database this encounter information will be shared with them immediately following your visit.   A [facility] MyChart account gives you access to today's visit and all your visits, tests, and labs performed at [facility] " click here if you don't have a [facility] MyChart account or go to [email or web address]  Consent: (Patient) [name] provided verbal consent for this virtual visit at the beginning of the encounter.  Current Medications:  Current Outpatient Medications:    benzonatate (TESSALON) 100 MG capsule, Take 1 capsule (100 mg total) by mouth 3 (three) times daily as needed for cough., Disp: 20 capsule, Rfl: 0   doxycycline (VIBRA-TABS) 100 MG tablet, Take 1 tablet (100 mg total) by mouth 2 (two) times daily for 10 days., Disp: 20 tablet, Rfl: 0   predniSONE (DELTASONE) 20 MG tablet, Take 2 tablets (40 mg total) by mouth daily with breakfast for 5 days., Disp: 10 tablet, Rfl: 0   albuterol (VENTOLIN HFA) 108 (90 Base) MCG/ACT inhaler, INHALE 2 PUFFS INTO THE LUNGS EVERY 6 HOURS AS NEEDED FOR WHEEZING OR SHORTNESS OF BREATH, Disp: 54 g, Rfl: 2   ALPRAZolam (XANAX) 0.5 MG tablet, Take 1 tablet (0.5 mg total) by mouth 2 (two) times daily as needed for anxiety. Do not operate heavy machinery or use alcohol with this medication., Disp: 4 tablet, Rfl: 0   amLODipine (NORVASC) 10 MG tablet, TAKE (1) TABLET BY MOUTH ONCE DAILY., Disp: 90 tablet, Rfl: 0   aspirin EC 81 MG tablet, Take 1 tablet (81 mg total) by mouth daily. Swallow whole., Disp: 90 tablet, Rfl: 3   budesonide-formoterol (SYMBICORT) 80-4.5 MCG/ACT inhaler, Inhale 2 puffs into the lungs 2 (two) times daily., Disp: 10.2 g, Rfl: 5   diclofenac (VOLTAREN) 75 MG EC tablet, TAKE 1 TABLET(75 MG) BY MOUTH TWICE DAILY  WITH A MEAL, Disp: 60 tablet, Rfl: 3   divalproex (DEPAKOTE ER) 500 MG 24 hr tablet, Take 1 tablet (500 mg total) by mouth daily., Disp: 30 tablet, Rfl: 2   DULoxetine (CYMBALTA) 30 MG capsule, Take 3 capsules (90 mg total) by mouth daily., Disp: 90 capsule, Rfl: 2   hydrOXYzine (ATARAX) 25 MG tablet, Take 1 tablet (25 mg total) by mouth 2 (two) times daily as needed for anxiety (insomnia)., Disp: 60 tablet, Rfl: 0   rosuvastatin (CRESTOR) 10 MG tablet, TAKE 1 TABLET(10 MG) BY MOUTH DAILY WITH SUPPER, Disp: 90 tablet, Rfl: 1   tiotropium (SPIRIVA HANDIHALER) 18 MCG inhalation capsule, INHALE THE CONTENTS OF 1 CAPSULE VIA INHALATION DEVICE EVERY DAY, Disp: 30 capsule, Rfl: 5   Medications ordered in this encounter:  Meds ordered this encounter  Medications   doxycycline (VIBRA-TABS) 100 MG tablet    Sig: Take 1 tablet (100 mg total) by mouth 2 (two) times daily for 10 days.    Dispense:  20 tablet    Refill:  0    Order Specific Question:   Supervising Provider    Answer:   Merrilee Jansky [[id number]]   benzonatate (TESSALON) 100 MG capsule    Sig: Take 1 capsule (100 mg total) by mouth 3 (three) times daily as needed for cough.    Dispense:  20 capsule    Refill:  0    Order Specific Question:   Supervising Provider  Answer:   Merrilee Jansky [[id number]]   predniSONE (DELTASONE) 20 MG tablet    Sig: Take 2 tablets (40 mg total) by mouth daily with breakfast for 5 days.    Dispense:  10 tablet    Refill:  0    Order Specific Question:   Supervising Provider    Answer:   Merrilee Jansky [id number]     *If you need refills on other medications prior to your next appointment, please contact your pharmacy*  Follow-Up: Call back or seek an in-person evaluation if the symptoms worsen or if the condition fails to improve as anticipated.  [facility] [phone]  Other Instructions   - Take meds as prescribed - Rest voice - Use a cool mist humidifier especially  during the winter months when heat dries out the air. - Use saline nose sprays frequently to help soothe nasal passages if they are drying out. - Stay hydrated by drinking plenty of fluids - Keep thermostat turn down low to prevent drying out which can cause a dry cough.  If you have been instructed to have an in-person evaluation today at a local Urgent Care facility, please use the link below. It will take you to a list of all of our available [facility], including address, phone number and hours of operation. Please do not delay care.  [facility]  If you or a family member do not have a primary care provider, use the link below to schedule a visit and establish care. When you choose a [facility] primary care physician or advanced practice provider, you gain a long-term partner in health. Find a Primary Care Provider  Learn more about [facility]'s in-office and virtual care options: [facility] - Get Care Now

## 2022-10-19 NOTE — Progress Notes (Addendum)
Virtual Visit Consent   [name], you are scheduled for a virtual visit with a [facility] provider today. Just as with appointments in the office, your consent must be obtained to participate. Your consent will be active for this visit and any virtual visit you may have with one of our providers in the next 365 days. If you have a MyChart account, a copy of this consent can be sent to you electronically.  As this is a virtual visit, video technology does not allow for your provider to perform a traditional examination. This may limit your provider's ability to fully assess your condition. If your provider identifies any concerns that need to be evaluated in person or the need to arrange testing (such as labs, EKG, etc.), we will make arrangements to do so. Although advances in technology are sophisticated, we cannot ensure that it will always work on either your end or our end. If the connection with a video visit is poor, the visit may have to be switched to a telephone visit. With either a video or telephone visit, we are not always able to ensure that we have a secure connection.  By engaging in this virtual visit, you consent to the provision of healthcare and authorize for your insurance to be billed (if applicable) for the services provided during this visit. Depending on your insurance coverage, you may receive a charge related to this service.  I need to obtain your verbal consent now. Are you willing to proceed with your visit today? [name] has provided verbal consent on 10/19/2022 for a virtual visit (video or telephone). Freddy Finner, NP  Date: 10/19/2022 12:34 PM  Virtual Visit via Video Note   I, Freddy Finner, connected with  [name]  ([id number], 10-07-1965) on 10/19/22 at 12:30 PM EDT by a video-enabled telemedicine application and verified that I am speaking with the correct person using two identifiers.  Location: Patient: Virtual Visit  Location Patient: Home Provider: Virtual Visit Location Provider: Home Office   I discussed the limitations of evaluation and management by telemedicine and the availability of in person appointments. The patient expressed understanding and agreed to proceed.    History of Present Illness: [name] is a 57 y.o. who identifies as a female who was assigned female at birth, and is being seen today for chest congestion.  Onset was 5 days ago- not feeling well- hurting with chest congestion and coughing. Associated symptoms are sore throat, early in the morning she has more pain in throat, nasal congestion, chest congestion worsened over the last several days and has green coloration. Shortness of breath if near baseline- but worsened with activity Modifying factors are inhalers- as per COPD and rescue use. Tylenol as needed  Denies chest pain, fevers or chills  Exposure to sick contacts- unknown COVID test: none Vaccines: covid and flu   Problems:  Patient Active Problem List   Diagnosis Date Noted   Puncture wound 10/01/2022   High risk medication monitoring: depakote 03/18/2022   PTSD (post-traumatic stress disorder) 02/05/2022   Psychophysiological insomnia 02/05/2022   Borderline personality disorder (HCC) 02/05/2022   Major depressive disorder, recurrent, moderate (HCC) 02/05/2022   Long term (current) use of non-steroidal anti-inflammatories (nsaid) 02/05/2022   Chronic R shoulder, hip, back pain 02/05/2022   Aortic atherosclerosis (HCC) 07/21/2021   Morbid obesity (HCC) 07/21/2021   Purpura senilis (HCC) 07/21/2021   Benign essential HTN 10/16/2020   GAD (generalized anxiety disorder)  04/07/2019   Fatty liver 06/08/2016   Diastolic dysfunction 05/04/2016   Tobacco use disorder 04/29/2016   COPD (chronic obstructive pulmonary disease) with emphysema (HCC) 12/20/2015    Allergies:  Allergies  Allergen Reactions   Gabapentin Hives   Augmentin [Amoxicillin-Pot  Clavulanate] Nausea And Vomiting   Lamictal [Lamotrigine] Rash   Medications:  Current Outpatient Medications:    albuterol (VENTOLIN HFA) 108 (90 Base) MCG/ACT inhaler, INHALE 2 PUFFS INTO THE LUNGS EVERY 6 HOURS AS NEEDED FOR WHEEZING OR SHORTNESS OF BREATH, Disp: 54 g, Rfl: 2   ALPRAZolam (XANAX) 0.5 MG tablet, Take 1 tablet (0.5 mg total) by mouth 2 (two) times daily as needed for anxiety. Do not operate heavy machinery or use alcohol with this medication., Disp: 4 tablet, Rfl: 0   amLODipine (NORVASC) 10 MG tablet, TAKE (1) TABLET BY MOUTH ONCE DAILY., Disp: 90 tablet, Rfl: 0   aspirin EC 81 MG tablet, Take 1 tablet (81 mg total) by mouth daily. Swallow whole., Disp: 90 tablet, Rfl: 3   budesonide-formoterol (SYMBICORT) 80-4.5 MCG/ACT inhaler, Inhale 2 puffs into the lungs 2 (two) times daily., Disp: 10.2 g, Rfl: 5   diclofenac (VOLTAREN) 75 MG EC tablet, TAKE 1 TABLET(75 MG) BY MOUTH TWICE DAILY WITH A MEAL, Disp: 60 tablet, Rfl: 3   divalproex (DEPAKOTE ER) 500 MG 24 hr tablet, Take 1 tablet (500 mg total) by mouth daily., Disp: 30 tablet, Rfl: 2   DULoxetine (CYMBALTA) 30 MG capsule, Take 3 capsules (90 mg total) by mouth daily., Disp: 90 capsule, Rfl: 2   hydrOXYzine (ATARAX) 25 MG tablet, Take 1 tablet (25 mg total) by mouth 2 (two) times daily as needed for anxiety (insomnia)., Disp: 60 tablet, Rfl: 0   rosuvastatin (CRESTOR) 10 MG tablet, TAKE 1 TABLET(10 MG) BY MOUTH DAILY WITH SUPPER, Disp: 90 tablet, Rfl: 1   tiotropium (SPIRIVA HANDIHALER) 18 MCG inhalation capsule, INHALE THE CONTENTS OF 1 CAPSULE VIA INHALATION DEVICE EVERY DAY, Disp: 30 capsule, Rfl: 5  Observations/Objective: Patient is well-developed, well-nourished in no acute distress.  Resting comfortably  at home.  Head is normocephalic, atraumatic.  No labored breathing.  Speech is clear and coherent with logical content.  Patient is alert and oriented at baseline.    Assessment and Plan:  1. Acute bacterial  bronchitis  - doxycycline (VIBRA-TABS) 100 MG tablet; Take 1 tablet (100 mg total) by mouth 2 (two) times daily for 10 days.  Dispense: 20 tablet; Refill: 0 - benzonatate (TESSALON) 100 MG capsule; Take 1 capsule (100 mg total) by mouth 3 (three) times daily as needed for cough.  Dispense: 20 capsule; Refill: 0 - predniSONE (DELTASONE) 20 MG tablet; Take 2 tablets (40 mg total) by mouth daily with breakfast for 5 days.  Dispense: 10 tablet; Refill: 0  - Take meds as prescribed - Rest voice - Use a cool mist humidifier especially during the winter months when heat dries out the air. - Use saline nose sprays frequently to help soothe nasal passages if they are drying out. - Stay hydrated by drinking plenty of fluids - Keep thermostat turn down low to prevent drying out which can cause a dry cough.   If you do not improve you will need a follow up visit in person.               Reviewed side effects, risks and benefits of medication.    Patient acknowledged agreement and understanding of the plan.   Past Medical, Surgical, Social History,  Allergies, and Medications have been Reviewed.   Follow Up Instructions: I discussed the assessment and treatment plan with the patient. The patient was provided an opportunity to ask questions and all were answered. The patient agreed with the plan and demonstrated an understanding of the instructions.  A copy of instructions were sent to the patient via MyChart unless otherwise noted below.    The patient was advised to call back or seek an in-person evaluation if the symptoms worsen or if the condition fails to improve as anticipated.  Time:  I spent 10 minutes with the patient via telehealth technology discussing the above problems/concerns.    Freddy Finner, NP

## 2022-10-20 ENCOUNTER — Other Ambulatory Visit (HOSPITAL_COMMUNITY): Payer: Self-pay | Admitting: Psychiatry

## 2022-10-20 DIAGNOSIS — F4310 Post-traumatic stress disorder, unspecified: Secondary | ICD-10-CM

## 2022-10-20 DIAGNOSIS — G8929 Other chronic pain: Secondary | ICD-10-CM

## 2022-10-20 DIAGNOSIS — F603 Borderline personality disorder: Secondary | ICD-10-CM

## 2022-10-20 DIAGNOSIS — F411 Generalized anxiety disorder: Secondary | ICD-10-CM

## 2022-11-09 ENCOUNTER — Telehealth (INDEPENDENT_AMBULATORY_CARE_PROVIDER_SITE_OTHER): Payer: Federal, State, Local not specified - PPO | Admitting: Psychiatry

## 2022-11-09 ENCOUNTER — Encounter (HOSPITAL_COMMUNITY): Payer: Self-pay | Admitting: Psychiatry

## 2022-11-09 DIAGNOSIS — G8929 Other chronic pain: Secondary | ICD-10-CM

## 2022-11-09 DIAGNOSIS — F17200 Nicotine dependence, unspecified, uncomplicated: Secondary | ICD-10-CM

## 2022-11-09 DIAGNOSIS — F5104 Psychophysiologic insomnia: Secondary | ICD-10-CM | POA: Diagnosis not present

## 2022-11-09 DIAGNOSIS — F411 Generalized anxiety disorder: Secondary | ICD-10-CM

## 2022-11-09 DIAGNOSIS — F603 Borderline personality disorder: Secondary | ICD-10-CM

## 2022-11-09 DIAGNOSIS — F4310 Post-traumatic stress disorder, unspecified: Secondary | ICD-10-CM

## 2022-11-09 MED ORDER — HYDROXYZINE HCL 50 MG PO TABS
50.0000 mg | ORAL_TABLET | Freq: Two times a day (BID) | ORAL | 1 refills | Status: DC | PRN
Start: 2022-11-09 — End: 2022-12-10
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
## 2022-11-09 NOTE — Patient Instructions (Signed)
We increased the hydroxyzine to 50 mg nightly as needed to see if this helps a bit more with your insomnia.  We are holding off on renewing the Depakote for now since the 500 mg dose was leading to a bit of nausea.  If we revisit in the future we will likely try a lower dose.  Keep taking the Cymbalta 90 mg daily for now.  In order to make the psychotherapy a bit more effective try looking through this DBT (dialectical behavioral therapy) PDF: [email or web address]://www.mosley.info/.pdf

## 2022-11-19 ENCOUNTER — Other Ambulatory Visit: Payer: Self-pay | Admitting: Family Medicine

## 2022-11-19 DIAGNOSIS — I10 Essential (primary) hypertension: Secondary | ICD-10-CM

## 2022-12-10 ENCOUNTER — Encounter (HOSPITAL_COMMUNITY): Payer: Self-pay | Admitting: Psychiatry

## 2022-12-10 ENCOUNTER — Telehealth (HOSPITAL_COMMUNITY): Payer: Federal, State, Local not specified - PPO | Admitting: Psychiatry

## 2022-12-10 DIAGNOSIS — F17200 Nicotine dependence, unspecified, uncomplicated: Secondary | ICD-10-CM

## 2022-12-10 DIAGNOSIS — F603 Borderline personality disorder: Secondary | ICD-10-CM | POA: Diagnosis not present

## 2022-12-10 DIAGNOSIS — F5104 Psychophysiologic insomnia: Secondary | ICD-10-CM

## 2022-12-10 DIAGNOSIS — F411 Generalized anxiety disorder: Secondary | ICD-10-CM | POA: Diagnosis not present

## 2022-12-10 DIAGNOSIS — G8929 Other chronic pain: Secondary | ICD-10-CM

## 2022-12-10 DIAGNOSIS — F17210 Nicotine dependence, cigarettes, uncomplicated: Secondary | ICD-10-CM

## 2022-12-10 DIAGNOSIS — F4310 Post-traumatic stress disorder, unspecified: Secondary | ICD-10-CM | POA: Diagnosis not present

## 2022-12-10 DIAGNOSIS — F331 Major depressive disorder, recurrent, moderate: Secondary | ICD-10-CM

## 2022-12-10 MED ORDER — ARIPIPRAZOLE 2 MG PO TABS
2.0000 mg | ORAL_TABLET | Freq: Every day | ORAL | 2 refills | Status: DC
Start: 2022-12-10 — End: 2023-01-22

## 2022-12-10 MED ORDER — TRAZODONE HCL 50 MG PO TABS
50.0000 mg | ORAL_TABLET | Freq: Every day | ORAL | 2 refills | Status: DC
Start: 2022-12-10 — End: 2023-01-22

## 2022-12-10 MED ORDER — DULOXETINE HCL 30 MG PO CPEP
90.0000 mg | ORAL_CAPSULE | Freq: Every day | ORAL | 2 refills | Status: DC
Start: 2022-12-10 — End: 2023-04-05

## 2022-12-10 NOTE — Patient Instructions (Signed)
Further stages from Fredia Beets that we were talking about to get a sense of what can be normal questions to ask of oneself throughout life: https://www.verywellmind.com/erik-eriksons-stages-of-psychosocial-development-2795740  For your depression we kept the Cymbalta the same for now but added Abilify (aripiprazole) 2 mg once daily to your regimen today.  While you are on this medication it would be helpful to get an updated EKG, lipid panel, A1c from your primary care provider when you get a chance.  We discontinued the hydroxyzine since it was giving some restless leg sensation and instead replaced it with 50 mg of trazodone which you can take nightly if you need to.
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
## 2022-12-20 ENCOUNTER — Other Ambulatory Visit: Payer: Self-pay | Admitting: Family Medicine

## 2022-12-24 ENCOUNTER — Encounter (HOSPITAL_COMMUNITY): Payer: Self-pay

## 2023-01-04 ENCOUNTER — Telehealth (HOSPITAL_COMMUNITY): Payer: Self-pay

## 2023-01-04 NOTE — Telephone Encounter (Signed)
Medication management - Called patient back, after reviewing her MyChart message left, as patient stated she had to leave work the previous evening due to being upset her employeer was allowing someone to return they had complained about for safety issues and creating a "hostile work environment".  Pt stated the person then complains to their post office leaders that they are the ones who are hostile and it is them but they keep allowing her to return.  States they are not supposed to discuss politics on the floor but this person keeps doing this.  States no medications have been very helpful for her recently and not sleeping.  States she was up all night the previous evening and would like to discuss anything that might help her manage all the anxiety she is currently experiencing.  Agreed to send request to covering provider since Dr. Adrian Blackwater is out this week and patient agreed.   This was the MyChart message patient sent to Dr. Adrian Blackwater: "I am not feeling well. I called out of work this morning. I'm shaky, headache, completely overwhelmed. My house looks like a bomb went off in it. I can't even explain work stress by typing, it's just a mess. I really need to do something different. For me to call out of work isn't normal, but I just don't have it in me today. I had a complete meltdown last night."

## 2023-01-05 ENCOUNTER — Telehealth (HOSPITAL_COMMUNITY): Payer: Self-pay | Admitting: Psychiatry

## 2023-01-05 NOTE — Telephone Encounter (Signed)
This Clinical research associate is covering for Dr. Adrian Blackwater while out of office. Patient had reached out via MyChart and spoken with clinical staff reporting overwhelm in setting of hostile work environment.  This Clinical research associate called patient for approx. 10 min phone call: She reports she has had difficulty with recent medication changes as she experienced drowsiness on Abilify 5 mg despite moving to nighttime. Was instructed to trial 2.5 mg - although she reports this resolved sedation she has not found this dose to be effective and has discontinued. She continues to take Cymbalta 90 mg daily; not recently requiring trazodone for sleep although notes she previously found this effective. She describes "high stress" at work leading to frustration, anxiety, and concern she will "lose it" if particular person at work tries to talk to her. She denies SI/HI or plan for aggressive acts towards this person. Brief therapeutic support and empathic listening provided. She reports she is longer seeing her psychotherapist. Explored options to provide patient with relief during acute stress until f/u appt with Dr. Adrian Blackwater next week 8/15. She denies past trial of low dose trazodone for anxiety; cannot recall past efficacy of hydroxyzine. As she has already established tolerability of trazodone, she opted to trial trazodone 25 mg BID PRN severe anxiety and continue trazodone 50 mg nightly PRN sleep until next follow-up appointment. Declines need for medication refill at this time. All questions/concerns addressed.  Daine Gip, MD 01/05/23

## 2023-01-12 ENCOUNTER — Telehealth (HOSPITAL_COMMUNITY): Payer: Self-pay | Admitting: Professional

## 2023-01-12 ENCOUNTER — Telehealth (INDEPENDENT_AMBULATORY_CARE_PROVIDER_SITE_OTHER): Payer: Federal, State, Local not specified - PPO | Admitting: Psychiatry

## 2023-01-12 ENCOUNTER — Encounter (HOSPITAL_COMMUNITY): Payer: Self-pay | Admitting: Psychiatry

## 2023-01-12 DIAGNOSIS — F4310 Post-traumatic stress disorder, unspecified: Secondary | ICD-10-CM | POA: Diagnosis not present

## 2023-01-12 DIAGNOSIS — F411 Generalized anxiety disorder: Secondary | ICD-10-CM | POA: Diagnosis not present

## 2023-01-12 DIAGNOSIS — F332 Major depressive disorder, recurrent severe without psychotic features: Secondary | ICD-10-CM

## 2023-01-12 DIAGNOSIS — F603 Borderline personality disorder: Secondary | ICD-10-CM

## 2023-01-12 DIAGNOSIS — F17200 Nicotine dependence, unspecified, uncomplicated: Secondary | ICD-10-CM

## 2023-01-12 DIAGNOSIS — Z79899 Other long term (current) drug therapy: Secondary | ICD-10-CM | POA: Insufficient documentation

## 2023-01-12 DIAGNOSIS — F5104 Psychophysiologic insomnia: Secondary | ICD-10-CM

## 2023-01-12 DIAGNOSIS — F17210 Nicotine dependence, cigarettes, uncomplicated: Secondary | ICD-10-CM

## 2023-01-12 DIAGNOSIS — G4700 Insomnia, unspecified: Secondary | ICD-10-CM | POA: Diagnosis not present
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
## 2023-01-12 NOTE — Patient Instructions (Signed)
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  Also increase the Abilify back to 2 mg nightly to see if this helps with your mood.  As we discussed I placed a referral to the partial hospitalization versus intensive outpatient program and whenever you get a chance send over the [facility] paperwork and we can try to fill that out so you can have time off.  We have a chance please send over the results of the EKG, lipid panel, A1c while you are on the Abilify for monitoring.

## 2023-01-14 ENCOUNTER — Encounter (HOSPITAL_COMMUNITY): Payer: Self-pay

## 2023-01-14 ENCOUNTER — Other Ambulatory Visit (HOSPITAL_COMMUNITY): Payer: Federal, State, Local not specified - PPO | Attending: Psychiatry | Admitting: Licensed Clinical Social Worker

## 2023-01-14 ENCOUNTER — Telehealth (INDEPENDENT_AMBULATORY_CARE_PROVIDER_SITE_OTHER): Payer: Federal, State, Local not specified - PPO | Admitting: Psychiatry

## 2023-01-14 DIAGNOSIS — F332 Major depressive disorder, recurrent severe without psychotic features: Secondary | ICD-10-CM | POA: Diagnosis not present

## 2023-01-14 DIAGNOSIS — F4310 Post-traumatic stress disorder, unspecified: Secondary | ICD-10-CM | POA: Diagnosis not present

## 2023-01-14 DIAGNOSIS — F603 Borderline personality disorder: Secondary | ICD-10-CM | POA: Diagnosis not present

## 2023-01-14 NOTE — Treatment Plan (Signed)
Active     OP Depression     LTG: Reduce frequency, intensity, and duration of depression symptoms so that daily functioning is improved (Initial)     Start:  01/14/23    Expected End:  04/16/23         LTG: Increase coping skills to manage depression and improve ability to perform daily activities (Initial)     Start:  01/14/23    Expected End:  04/16/23         LTG: [name] will score less than 10 on the Patient Health Questionnaire (PHQ-9)  (Initial)     Start:  01/14/23    Expected End:  04/16/23         STG: [name] will attend at least 80% of scheduled PHP sessions    (Initial)     Start:  01/14/23    Expected End:  04/16/23         STG: [name] will attend at least 80% of scheduled group psychotherapy sessions  (Initial)     Start:  01/14/23    Expected End:  04/16/23         STG: [name] will complete at least 80% of assigned homework  (Initial)     Start:  01/14/23    Expected End:  04/16/23         STG: [name] will identify cognitive patterns and beliefs that support depression (Initial)     Start:  01/14/23    Expected End:  04/16/23         Work with [name] to identify the major components of a recent episode of depression: physical symptoms, major thoughts and images, and major behaviors they experienced     Start:  01/14/23         Therapist will educate patient on cognitive distortions and the rationale for treatment of depression     Start:  01/14/23         [name] will identify cognitive distortions they are currently using and write reframing statements to replace them     Start:  01/14/23         Therapist will review PLEASE Skills (Treat Physical Illness, Balance Eating, Avoid Mood-Altering Substances, Balance Sleep and Get Exercise) with patient     Start:  01/14/23
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
## 2023-01-15 ENCOUNTER — Telehealth (HOSPITAL_COMMUNITY): Payer: Self-pay | Admitting: Licensed Clinical Social Worker

## 2023-01-15 ENCOUNTER — Other Ambulatory Visit (HOSPITAL_COMMUNITY): Payer: Federal, State, Local not specified - PPO

## 2023-01-18 ENCOUNTER — Ambulatory Visit (HOSPITAL_COMMUNITY): Payer: Federal, State, Local not specified - PPO

## 2023-01-18 ENCOUNTER — Other Ambulatory Visit (HOSPITAL_COMMUNITY): Payer: Federal, State, Local not specified - PPO

## 2023-01-19 ENCOUNTER — Other Ambulatory Visit (HOSPITAL_COMMUNITY): Payer: Federal, State, Local not specified - PPO

## 2023-01-19 ENCOUNTER — Ambulatory Visit (HOSPITAL_COMMUNITY): Payer: Federal, State, Local not specified - PPO

## 2023-01-19 DIAGNOSIS — I10 Essential (primary) hypertension: Secondary | ICD-10-CM | POA: Diagnosis not present

## 2023-01-19 DIAGNOSIS — Z6841 Body Mass Index (BMI) 40.0 and over, adult: Secondary | ICD-10-CM | POA: Diagnosis not present

## 2023-01-19 DIAGNOSIS — M25572 Pain in left ankle and joints of left foot: Secondary | ICD-10-CM | POA: Diagnosis not present

## 2023-01-19 DIAGNOSIS — M25571 Pain in right ankle and joints of right foot: Secondary | ICD-10-CM | POA: Diagnosis not present

## 2023-01-20 ENCOUNTER — Other Ambulatory Visit (HOSPITAL_COMMUNITY): Payer: Federal, State, Local not specified - PPO

## 2023-01-20 ENCOUNTER — Ambulatory Visit (HOSPITAL_COMMUNITY): Payer: Federal, State, Local not specified - PPO

## 2023-01-21 ENCOUNTER — Other Ambulatory Visit (HOSPITAL_COMMUNITY): Payer: Federal, State, Local not specified - PPO

## 2023-01-21 ENCOUNTER — Ambulatory Visit (HOSPITAL_COMMUNITY): Payer: Federal, State, Local not specified - PPO

## 2023-01-22 ENCOUNTER — Encounter: Payer: Self-pay | Admitting: Nurse Practitioner

## 2023-01-22 ENCOUNTER — Other Ambulatory Visit (HOSPITAL_COMMUNITY): Payer: Federal, State, Local not specified - PPO

## 2023-01-22 ENCOUNTER — Ambulatory Visit (HOSPITAL_COMMUNITY): Payer: Federal, State, Local not specified - PPO

## 2023-01-22 ENCOUNTER — Ambulatory Visit: Payer: Federal, State, Local not specified - PPO | Admitting: Nurse Practitioner

## 2023-01-22 VITALS — BP 134/75 | HR 90 | Temp 97.7°F | Ht 63.0 in | Wt 229.0 lb

## 2023-01-22 DIAGNOSIS — M722 Plantar fascial fibromatosis: Secondary | ICD-10-CM | POA: Diagnosis not present

## 2023-01-22 MED ORDER — HYDROCODONE-ACETAMINOPHEN 5-325 MG PO TABS
1.0000 | ORAL_TABLET | ORAL | 0 refills | Status: AC | PRN
Start: 2023-01-22 — End: 2023-01-27

## 2023-01-22 NOTE — Progress Notes (Unsigned)
   Subjective:    Patient ID: [name], female    DOB: 01-May-1966, 58 y.o.   MRN: [id number]  HPI L foot plantar fascia pain , was given a boot at [facility] to wear at work and was told by her job she can't wear it to work , request something for pain Has had progressive severe left plantar fasciitis.  Was given a boot to wear to work, patient has a picture of the boot on her phone.  Is wearing regular athletic shoes today.  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  Patient states she has to work "the next 2 weeks".  Has tried diclofenac and ice/heat applications with minimal improvement.  Pain mainly occurs with weightbearing, does not improved after walking for a while.  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  When asked if she will be allowed to wear a boot with a letter patient stated no. Note the patient gets mental health follow-up with psychiatry and her counselor. Denies suicidal ideation at this time.  Review of Systems  Respiratory:  Negative for cough, chest tightness and shortness of breath.   Cardiovascular:  Negative for chest pain.  Musculoskeletal:  Positive for gait problem.       Left foot pain       Objective:   Physical Exam NAD.  Alert, oriented.  Mildly agitated affect.  Making eye contact at times.  Speech clear.  Lungs clear.  Heart regular rate rhythm.  Left foot warm with normal DP pulse.  Normal ROM of the left ankle.  Distinct tenderness noted in the left heel going along the plantar aspect towards the toes.  No erythema.  Gait slight limp noted. Today's Vitals   01/22/23 1112  BP: 134/75  Pulse: 90  Temp: 97.7 F (36.5 C)  SpO2: 98%  Weight: 229 lb (103.9 kg)  Height: 5\' 3"  (1.6 m)   Body mass index is 40.57 kg/m.        Assessment & Plan:   Problem List Items Addressed This Visit        Musculoskeletal and Integument   Plantar fasciitis of left foot - Primary   Relevant Orders   Ambulatory referral to Podiatry   Meds ordered this encounter  Medications   HYDROcodone-acetaminophen (NORCO/VICODIN) 5-325 MG tablet    Sig: Take 1 tablet by mouth every 4 (four) hours as needed for up to 5 days.    Dispense:  30 tablet    Refill:  0    Order Specific Question:   Supervising Provider    Answer:   Babs Sciara [[id number]]   According to the chart patient has a history of a substance use disorder.  Explained that we will do a one-time prescription for hydrocodone for extreme pain.  Cautioned about potential drowsiness.  In addition patient to try a bottle of frozen water place her foot on it and roll back-and-forth.  Continue diclofenac as directed.  Wear the boot is much as possible when outside of work.  A referral was placed to podiatry in [place].  Further follow-up there.  Patient verbalizes understanding and agrees with this plan.

## 2023-01-22 NOTE — Patient Instructions (Addendum)
[facility] Plantar Fasciitis sleeve Plantar Fasciitis  Plantar fasciitis is a painful foot condition that affects the heel. It occurs when the band of tissue that connects the toes to the heel bone (plantar fascia) becomes irritated. This can happen as the result of exercising too much or doing other repetitive activities (overuse injury). Plantar fasciitis can cause mild irritation to severe pain that makes it difficult to walk or move. The pain is usually worse in the morning after sleeping, or after sitting or lying down for a period of time. Pain may also be worse after long periods of walking or standing. What are the causes? This condition may be caused by: Standing for long periods of time. Wearing shoes that do not have good arch support. Doing activities that put stress on joints (high-impact activities). This includes ballet and exercise that makes your heart beat faster (aerobic exercise), such as running. Being overweight. An abnormal way of walking (gait). Tight muscles in the back of your lower leg (calf). High arches in your feet or flat feet. Starting a new athletic activity. What are the signs or symptoms? The main symptom of this condition is heel pain. Pain may get worse after the following: Taking the first steps after a time of rest, especially in the morning after awakening, or after you have been sitting or lying down for a while. Long periods of standing still. Pain may decrease after 30-45 minutes of activity, such as gentle walking. How is this diagnosed? This condition may be diagnosed based on your medical history, a physical exam, and your symptoms. Your health care provider will check for: A tender area on the bottom of your foot. A high arch in your foot or flat feet. Pain when you move your foot. Difficulty moving your foot. You may have imaging tests to confirm the diagnosis, such as: X-rays. Ultrasound. MRI. How is this  treated? Treatment for plantar fasciitis depends on how severe your condition is. Treatment may include: Rest, ice, pressure (compression), and raising (elevating) the affected foot. This is called RICE therapy. Your health care provider may recommend RICE therapy along with over-the-counter pain medicines to manage your pain. Exercises to stretch your calves and your plantar fascia. A splint that holds your foot in a stretched, upward position while you sleep (night splint). Physical therapy to relieve symptoms and prevent problems in the future. Injections of steroid medicine (cortisone) to relieve pain and inflammation. Stimulating your plantar fascia with electrical impulses (extracorporeal shock wave therapy). This is usually the last treatment option before surgery. Surgery, if other treatments have not worked after 12 months. Follow these instructions at home: Managing pain, stiffness, and swelling  If directed, put ice on the painful area. To do this: Put ice in a plastic bag, or use a frozen bottle of water. Place a towel between your skin and the bag or bottle. Roll the bottom of your foot over the bag or bottle. Do this for 20 minutes, 2-3 times a day. Wear athletic shoes that have air-sole or gel-sole cushions, or try soft shoe inserts that are designed for plantar fasciitis. Elevate your foot above the level of your heart while you are sitting or lying down. Activity Avoid activities that cause pain. Ask your health care provider what activities are safe for you. Do physical therapy exercises and stretches as told by your health care provider. Try activities and forms of exercise that are easier on your joints (low impact). Examples include swimming,  water aerobics, and biking. General instructions Take over-the-counter and prescription medicines only as told by your health care provider. Wear a night splint while sleeping, if told by your health care provider. Loosen the splint  if your toes tingle, become numb, or turn cold and blue. Maintain a healthy weight, or work with your health care provider to lose weight as needed. Keep all follow-up visits. This is important. Contact a health care provider if you have: Symptoms that do not go away with home treatment. Pain that gets worse. Pain that affects your ability to move or do daily activities. Summary Plantar fasciitis is a painful foot condition that affects the heel. It occurs when the band of tissue that connects the toes to the heel bone (plantar fascia) becomes irritated. Heel pain is the main symptom of this condition. It may get worse after exercising too much or standing still for a long time. Treatment varies, but it usually starts with rest, ice, pressure (compression), and raising (elevating) the affected foot. This is called RICE therapy. Over-the-counter medicines can also be used to manage pain. This information is not intended to replace advice given to you by your health care provider. Make sure you discuss any questions you have with your health care provider. Document Revised: 09/04/2019 Document Reviewed: 09/04/2019 Elsevier Patient Education  2024 [employer].

## 2023-01-23 ENCOUNTER — Encounter: Payer: Self-pay | Admitting: Nurse Practitioner

## 2023-01-25 ENCOUNTER — Ambulatory Visit (HOSPITAL_COMMUNITY): Payer: Federal, State, Local not specified - PPO

## 2023-01-25 ENCOUNTER — Other Ambulatory Visit (HOSPITAL_COMMUNITY): Payer: Federal, State, Local not specified - PPO

## 2023-01-26 ENCOUNTER — Ambulatory Visit (HOSPITAL_COMMUNITY): Payer: Federal, State, Local not specified - PPO

## 2023-01-26 ENCOUNTER — Other Ambulatory Visit (HOSPITAL_COMMUNITY): Payer: Federal, State, Local not specified - PPO

## 2023-01-27 ENCOUNTER — Ambulatory Visit (HOSPITAL_COMMUNITY): Payer: Federal, State, Local not specified - PPO

## 2023-01-27 ENCOUNTER — Other Ambulatory Visit (HOSPITAL_COMMUNITY): Payer: Federal, State, Local not specified - PPO

## 2023-01-28 ENCOUNTER — Ambulatory Visit (HOSPITAL_COMMUNITY): Payer: Federal, State, Local not specified - PPO

## 2023-01-28 ENCOUNTER — Telehealth (HOSPITAL_COMMUNITY): Payer: Federal, State, Local not specified - PPO | Admitting: Psychiatry

## 2023-01-29 ENCOUNTER — Other Ambulatory Visit (HOSPITAL_COMMUNITY): Payer: Federal, State, Local not specified - PPO

## 2023-01-29 ENCOUNTER — Ambulatory Visit (HOSPITAL_COMMUNITY): Payer: Federal, State, Local not specified - PPO

## 2023-02-01 ENCOUNTER — Ambulatory Visit (HOSPITAL_COMMUNITY): Payer: Federal, State, Local not specified - PPO

## 2023-02-01 ENCOUNTER — Other Ambulatory Visit (HOSPITAL_COMMUNITY): Payer: Federal, State, Local not specified - PPO

## 2023-02-02 ENCOUNTER — Other Ambulatory Visit (HOSPITAL_COMMUNITY): Payer: Federal, State, Local not specified - PPO

## 2023-02-02 ENCOUNTER — Ambulatory Visit (HOSPITAL_COMMUNITY): Payer: Federal, State, Local not specified - PPO

## 2023-02-02 ENCOUNTER — Other Ambulatory Visit: Payer: Self-pay | Admitting: Family Medicine

## 2023-02-08 ENCOUNTER — Ambulatory Visit (INDEPENDENT_AMBULATORY_CARE_PROVIDER_SITE_OTHER): Payer: Federal, State, Local not specified - PPO

## 2023-02-08 ENCOUNTER — Encounter: Payer: Self-pay | Admitting: Podiatry

## 2023-02-08 ENCOUNTER — Ambulatory Visit: Payer: Federal, State, Local not specified - PPO | Admitting: Podiatry

## 2023-02-08 DIAGNOSIS — M722 Plantar fascial fibromatosis: Secondary | ICD-10-CM

## 2023-02-08 DIAGNOSIS — M778 Other enthesopathies, not elsewhere classified: Secondary | ICD-10-CM | POA: Diagnosis not present

## 2023-02-08 MED ORDER — TRIAMCINOLONE ACETONIDE 10 MG/ML IJ SUSP
10.0000 mg | Freq: Once | INTRAMUSCULAR | Status: AC
Start: 2023-02-08 — End: 2023-02-08
  Administered 2023-02-08: 10 mg via INTRA_ARTICULAR

## 2023-02-08 NOTE — Patient Instructions (Signed)

## 2023-02-09 NOTE — Progress Notes (Signed)
Subjective:   Patient ID: [name], female   DOB: 57 y.o.   MRN: [id number]   HPI Patient presents with a lot of pain in the plantar aspect of the left heel and states that it has been going on for a few months and does not remember injury.  Patient smokes a pack and a half of cigarettes per day tries to be active   Review of Systems  All other systems reviewed and are negative.       Objective:  Physical Exam Vitals and nursing note reviewed.  Constitutional:      Appearance: She is well-developed.  Pulmonary:     Effort: Pulmonary effort is normal.  Musculoskeletal:        General: Normal range of motion.  Skin:    General: Skin is warm.  Neurological:     Mental Status: She is alert.     Neurovascular status found to be intact muscle strength is adequate range of motion adequate with exquisite discomfort plantar aspect left heel at the insertional point of the tendon into the calcaneus     Assessment:  Acute plantar fasciitis left inflammation fluid in the medial band at insertion     Plan:  H&P reviewed condition sterile prep and injected the plantar fascia at insertion 3 mg Kenalog 5 mg Xylocaine applied fascial brace to hold up the arch gave instructions stretching physical therapy and shoe gear modifications  X-rays indicate minimal spur formation no indication stress fracture arthritis associated with condition

## 2023-02-13 DIAGNOSIS — S63501A Unspecified sprain of right wrist, initial encounter: Secondary | ICD-10-CM | POA: Diagnosis not present

## 2023-02-13 DIAGNOSIS — Z6841 Body Mass Index (BMI) 40.0 and over, adult: Secondary | ICD-10-CM | POA: Diagnosis not present

## 2023-02-22 ENCOUNTER — Ambulatory Visit: Payer: Federal, State, Local not specified - PPO | Admitting: Podiatry

## 2023-02-24 ENCOUNTER — Ambulatory Visit: Payer: Federal, State, Local not specified - PPO | Admitting: Podiatry

## 2023-03-03 ENCOUNTER — Ambulatory Visit: Payer: Federal, State, Local not specified - PPO | Admitting: Podiatry

## 2023-03-05 ENCOUNTER — Other Ambulatory Visit: Payer: Self-pay | Admitting: Family Medicine

## 2023-03-05 DIAGNOSIS — Z1211 Encounter for screening for malignant neoplasm of colon: Secondary | ICD-10-CM

## 2023-03-05 DIAGNOSIS — Z1212 Encounter for screening for malignant neoplasm of rectum: Secondary | ICD-10-CM

## 2023-03-11 ENCOUNTER — Ambulatory Visit: Payer: Federal, State, Local not specified - PPO | Admitting: Podiatry

## 2023-03-11 ENCOUNTER — Encounter: Payer: Self-pay | Admitting: Podiatry

## 2023-03-11 DIAGNOSIS — M722 Plantar fascial fibromatosis: Secondary | ICD-10-CM | POA: Diagnosis not present

## 2023-03-11 MED ORDER — TRIAMCINOLONE ACETONIDE 10 MG/ML IJ SUSP
10.0000 mg | Freq: Once | INTRAMUSCULAR | Status: AC
Start: 2023-03-11 — End: 2023-03-11
  Administered 2023-03-11: 10 mg via INTRA_ARTICULAR

## 2023-03-11 NOTE — Progress Notes (Signed)
Orthotic eval   Patient was seen, measured / scanned for custom molded foot orthotics. DX of Plantar Fasciitis Patient will benefit from CFO's as they will help provide total contact to MLA's helping to better distribute body weight across BIL feet greater reducing plantar pressure and pain and to also encourage FF and RF alignment.  Patient was scanned items to be ordered and fit when in  [employer], CFo, CFm

## 2023-03-12 NOTE — Progress Notes (Signed)
Subjective:   Patient ID: [name], female   DOB: 57 y.o.   MRN: [id number]   HPI Patient presents with heel and arch pain both feet stating she had mild relief but still has a lot of pain works full weightbearing job moderate obesity and needs some kind of support   ROS      Objective:  Physical Exam  Neurovascular status intact with inflammation of the plantar fascia distal to its insertion calcaneus bilateral with fluid buildup around the band and pain with palpation left over right with collapse medial longitudinal arch to a moderate degree bilateral     Assessment:  Inflammatory fasciitis bilateral with pain with foot structural issues     Plan:  H&P patient is evaluated by pedorthist who agrees and castings for functional customized orthotic devices created today and I went ahead did sterile prep and injected the fascia distal to its insertion calcaneus bilateral 3 mg Kenalog 5 mg Xylocaine

## 2023-03-22 ENCOUNTER — Telehealth: Payer: Self-pay | Admitting: Family Medicine

## 2023-03-22 NOTE — Telephone Encounter (Signed)
May have 30 tablets 1 twice daily as needed Would need to have some up-to-date lab work before doing further medication To include metabolic 7, lipid, liver, high risk medication, hyperlipidemia  Also a standard medical follow-up visit somewhere in the next 3 to 4 months with [place] would be wise

## 2023-03-22 NOTE — Telephone Encounter (Signed)
Refill on   diclofenac (VOLTAREN) 75 MG EC tablet  Send to [facility]

## 2023-03-23 MED ORDER — DICLOFENAC SODIUM 75 MG PO TBEC: DELAYED_RELEASE_TABLET | ORAL | 0 refills | Status: DC

## 2023-03-23 NOTE — Telephone Encounter (Signed)
Prescription sent electronically to pharmacy and patient notified she needs office visit and labs.

## 2023-03-25 ENCOUNTER — Other Ambulatory Visit (HOSPITAL_COMMUNITY): Payer: Self-pay | Admitting: Psychiatry

## 2023-03-25 DIAGNOSIS — F411 Generalized anxiety disorder: Secondary | ICD-10-CM

## 2023-03-25 DIAGNOSIS — F603 Borderline personality disorder: Secondary | ICD-10-CM

## 2023-03-25 DIAGNOSIS — G8929 Other chronic pain: Secondary | ICD-10-CM

## 2023-03-25 DIAGNOSIS — F4310 Post-traumatic stress disorder, unspecified: Secondary | ICD-10-CM

## 2023-04-05 ENCOUNTER — Encounter: Payer: Self-pay | Admitting: Family Medicine

## 2023-04-05 MED ORDER — DULOXETINE HCL 30 MG PO CPEP: ORAL_CAPSULE | ORAL | 1 refills | Status: DC

## 2023-04-05 NOTE — Telephone Encounter (Signed)
Patient may have 1 month of duloxetine 30 mg, take 3 daily, #90 with 1 refill  If she is having a difficult time getting set up for a follow-up with mental health she will need to do a follow-up office visit here  Otherwise regular follow-up visits every 6 months as planned thank you

## 2023-05-06 ENCOUNTER — Telehealth: Payer: Self-pay

## 2023-05-06 DIAGNOSIS — I10 Essential (primary) hypertension: Secondary | ICD-10-CM

## 2023-05-06 NOTE — Telephone Encounter (Signed)
Prescription Request  05/06/2023  LOV: Visit date not found  What is the name of the medication or equipment? albuterol (VENTOLIN HFA) 108 (90 Base) MCG/ACT inhaler   Have you contacted your pharmacy to request a refill? Yes   Which pharmacy would you like this sent to?  [facility] - [place], [place] - [address] [place] [address] Phone: [phone] Fax: [phone]    Patient notified that their request is being sent to the clinical staff for review and that they should receive a response within 2 business days.   Please advise at Mobile [phone] (mobile).

## 2023-05-06 NOTE — Telephone Encounter (Signed)
Also need refill on diclofenac (VOLTAREN) 75 MG EC tablet , budesonide-formoterol (SYMBICORT) 80-4.5 MCG/ACT inhaler ,amLODipine (NORVASC) 10 MG tablet

## 2023-05-10 ENCOUNTER — Other Ambulatory Visit: Payer: Self-pay | Admitting: Nurse Practitioner

## 2023-05-10 ENCOUNTER — Encounter: Payer: Self-pay | Admitting: Nurse Practitioner

## 2023-05-10 DIAGNOSIS — I10 Essential (primary) hypertension: Secondary | ICD-10-CM

## 2023-05-10 MED ORDER — DICLOFENAC SODIUM 75 MG PO TBEC: DELAYED_RELEASE_TABLET | ORAL | 0 refills | Status: DC

## 2023-05-10 MED ORDER — AMLODIPINE BESYLATE 10 MG PO TABS: ORAL_TABLET | ORAL | 0 refills | Status: DC

## 2023-05-10 MED ORDER — ALBUTEROL SULFATE HFA 108 (90 BASE) MCG/ACT IN AERS: 2.0000 | INHALATION_SPRAY | Freq: Four times a day (QID) | RESPIRATORY_TRACT | 0 refills | Status: DC | PRN

## 2023-05-10 MED ORDER — BUDESONIDE-FORMOTEROL FUMARATE 80-4.5 MCG/ACT IN AERO: 2.0000 | INHALATION_SPRAY | Freq: Two times a day (BID) | RESPIRATORY_TRACT | 0 refills | Status: DC

## 2023-05-10 NOTE — Telephone Encounter (Signed)
30 day Refills done and note sent to patient to schedule visit.

## 2023-05-31 ENCOUNTER — Other Ambulatory Visit: Payer: Self-pay | Admitting: Family Medicine

## 2023-05-31 ENCOUNTER — Telehealth: Payer: Self-pay | Admitting: Family Medicine

## 2023-05-31 NOTE — Telephone Encounter (Signed)
Spoke with patient and informed per drs notes, she has been scheduled for tomorrow with provider

## 2023-05-31 NOTE — Telephone Encounter (Signed)
Nurses Please talk with patient If she needs refills I can help her She has an appointment scheduled with Dr. Adriana Simas tomorrow but that does not appear to make sense unless there is a urgent need to see a doctor Otherwise I would recommend the office visit be with myself in the near future and I can give her enough refills to last until her office visit  (For the appointment could be with Toni Amend)

## 2023-06-01 ENCOUNTER — Encounter: Payer: Self-pay | Admitting: Physician Assistant

## 2023-06-01 ENCOUNTER — Ambulatory Visit (INDEPENDENT_AMBULATORY_CARE_PROVIDER_SITE_OTHER): Payer: Federal, State, Local not specified - PPO | Admitting: Physician Assistant

## 2023-06-01 ENCOUNTER — Ambulatory Visit: Payer: Federal, State, Local not specified - PPO | Admitting: Family Medicine

## 2023-06-01 VITALS — BP 132/78 | Ht 61.0 in | Wt 222.4 lb

## 2023-06-01 DIAGNOSIS — Z791 Long term (current) use of non-steroidal anti-inflammatories (NSAID): Secondary | ICD-10-CM | POA: Diagnosis not present

## 2023-06-01 DIAGNOSIS — I10 Essential (primary) hypertension: Secondary | ICD-10-CM | POA: Diagnosis not present

## 2023-06-01 DIAGNOSIS — F332 Major depressive disorder, recurrent severe without psychotic features: Secondary | ICD-10-CM | POA: Diagnosis not present

## 2023-06-01 DIAGNOSIS — J439 Emphysema, unspecified: Secondary | ICD-10-CM

## 2023-06-01 DIAGNOSIS — G8929 Other chronic pain: Secondary | ICD-10-CM

## 2023-06-01 DIAGNOSIS — I700 Atherosclerosis of aorta: Secondary | ICD-10-CM | POA: Diagnosis not present

## 2023-06-01 MED ORDER — DULOXETINE HCL 30 MG PO CPEP: ORAL_CAPSULE | ORAL | 1 refills | Status: DC

## 2023-06-01 MED ORDER — TIOTROPIUM BROMIDE MONOHYDRATE 18 MCG IN CAPS: ORAL_CAPSULE | RESPIRATORY_TRACT | 2 refills | Status: DC

## 2023-06-01 MED ORDER — ROSUVASTATIN CALCIUM 10 MG PO TABS: ORAL_TABLET | ORAL | 1 refills | Status: DC

## 2023-06-01 MED ORDER — AMLODIPINE BESYLATE 10 MG PO TABS: ORAL_TABLET | ORAL | 0 refills | Status: DC

## 2023-06-01 MED ORDER — DICLOFENAC SODIUM 75 MG PO TBEC: DELAYED_RELEASE_TABLET | ORAL | 0 refills | Status: DC

## 2023-06-01 MED ORDER — ALBUTEROL SULFATE HFA 108 (90 BASE) MCG/ACT IN AERS: 2.0000 | INHALATION_SPRAY | Freq: Four times a day (QID) | RESPIRATORY_TRACT | 0 refills | Status: DC | PRN

## 2023-06-01 MED ORDER — BUDESONIDE-FORMOTEROL FUMARATE 80-4.5 MCG/ACT IN AERO: 2.0000 | INHALATION_SPRAY | Freq: Two times a day (BID) | RESPIRATORY_TRACT | 0 refills | Status: DC

## 2023-06-01 NOTE — Assessment & Plan Note (Signed)
Blood pressure well controlled today. Continue with daily Norvasc.

## 2023-06-01 NOTE — Assessment & Plan Note (Signed)
Stable. Medications refilled today. Continue current treatment plan.

## 2023-06-01 NOTE — Assessment & Plan Note (Addendum)
 Patient reports increased of shoulder pain today.  Patient taking diclofenac  twice daily with frequent need for daily Tylenol .  Patient wishes to increase dose of diclofenac , patient counseled that she is on max dose and we cannot increase this medication.  She states she would be interested in shoulder injection if that was a possibility.  We will discuss this at her next visit.

## 2023-06-01 NOTE — Assessment & Plan Note (Signed)
Stable. Lipid panel today.

## 2023-06-01 NOTE — Assessment & Plan Note (Signed)
 PHQ-9 reviewed.  Patient reports recent increase in symptoms and due to a few days without medication.  She states she lost the bottle and needed to call for refill.  She is in between psychiatrist at the moment, but is working towards reestablishing care.  She states she feels great symptoms are manageable.  Medications refilled today.

## 2023-06-01 NOTE — Assessment & Plan Note (Signed)
Medications refilled today.  CMP ordered.

## 2023-06-01 NOTE — Progress Notes (Signed)
 Established Patient Office Visit  Subjective   Patient ID: [name], female    DOB: 1966/03/09  Age: 57 y.o. MRN: [id number]  Chief Complaint  Patient presents with   Hypertension    Follow up on medications Would like to increase antiinflammatory- having a lot of issues with pain    Patient presents today for chronic medical follow-up regarding hypertension, depression, shoulder pain, COPD, and medication refill.  Patient reports daily compliance with medications, however she states she does occasionally lose track of her Cymbalta  medication and therefore has fluctuations in depression symptoms.  Patient states she is in between psychiatrist at the moment, but is working towards establishing care.  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  Patient voices interest in discussing weight loss options and options for shoulder pain.     Review of Systems  Constitutional:  Negative for chills, fever and malaise/fatigue.  HENT: Negative.    Respiratory:  Positive for cough.   Cardiovascular:  Positive for leg swelling.  Gastrointestinal: Negative.   Musculoskeletal:  Positive for joint pain.  Psychiatric/Behavioral:  Positive for depression. The patient is nervous/anxious.       Objective:     BP 132/78   Ht 5' 1 (1.549 m)   Wt 222 lb 6.4 oz (100.9 kg)   LMP 03/18/2013 (Approximate)   BMI 42.02 kg/m    Physical Exam Constitutional:      Appearance: Normal appearance. She is obese.  HENT:     Head: Normocephalic.     Mouth/Throat:     Mouth: Mucous membranes are moist.     Pharynx: Oropharynx is clear.  Eyes:     Extraocular Movements: Extraocular movements intact.     Conjunctiva/sclera: Conjunctivae normal.  Cardiovascular:     Rate and Rhythm: Normal rate and regular rhythm.     Heart sounds: No murmur heard.    No gallop.  Pulmonary:     Effort: Pulmonary effort is normal.     Breath  sounds: No wheezing, rhonchi or rales.  Musculoskeletal:     Right lower leg: No edema.     Left lower leg: No edema.  Skin:    General: Skin is warm and dry.  Neurological:     General: No focal deficit present.     Mental Status: She is alert and oriented to person, place, and time.  Psychiatric:        Mood and Affect: Mood normal.        Behavior: Behavior normal.      No results found for any visits on 06/01/23.  The ASCVD Risk score (Arnett DK, et al., 2019) failed to calculate for the following reasons:   Cannot find a previous HDL lab   Cannot find a previous total cholesterol lab    Assessment & Plan:   Return to discuss weight loss options/shoulder pain.   Benign essential HTN Assessment & Plan: Blood pressure well controlled today. Continue with daily Norvasc .   Orders: -     CBC with Differential/Platelet -     CMP14+EGFR -     amLODIPine  Besylate; TAKE (1) TABLET BY MOUTH ONCE DAILY.  Dispense: 30 tablet; Refill: 0  Long term (current) use of non-steroidal anti-inflammatories (nsaid) Assessment & Plan: Medications refilled today.  CMP ordered.  Orders: -     CMP14+EGFR  Aortic atherosclerosis (HCC) Assessment & Plan: Stable. Lipid panel today.   Orders: -  Lipid panel -     Rosuvastatin  Calcium ; TAKE 1 TABLET(10 MG) BY MOUTH DAILY WITH SUPPER  Dispense: 90 tablet; Refill: 1  Major depressive disorder, recurrent severe without psychotic features [facility]) Assessment & Plan: PHQ-9 reviewed.  Patient reports recent increase in symptoms and due to a few days without medication.  She states she lost the bottle and needed to call for refill.  She is in between psychiatrist at the moment, but is working towards reestablishing care.  She states she feels great symptoms are manageable.  Medications refilled today.  Orders: -     DULoxetine  HCl; Duloxetine  30 mg,  Take 3 daily, #90 with 1 refill.  Dispense: 90 capsule; Refill: 1  Chronic R shoulder, hip,  back pain Assessment & Plan: Patient reports increased of shoulder pain today.  Patient taking diclofenac  twice daily with frequent need for daily Tylenol .  Patient wishes to increase dose of diclofenac , patient counseled that she is on max dose and we cannot increase this medication.  She states she would be interested in shoulder injection if that was a possibility.  We will discuss this at her next visit.  Orders: -     Diclofenac  Sodium; TAKE (1) TABLET BY MOUTH TWICE DAILY.  Dispense: 30 tablet; Refill: 0  Pulmonary emphysema, unspecified emphysema type (HCC) Assessment & Plan: Stable. Medications refilled today. Continue current treatment plan.   Orders: -     Albuterol  Sulfate HFA; Inhale 2 puffs into the lungs every 6 (six) hours as needed.  Dispense: 18 g; Refill: 0 -     Budesonide -Formoterol  Fumarate; Inhale 2 puffs into the lungs 2 (two) times daily.  Dispense: 10.2 g; Refill: 0 -     Tiotropium Bromide  Monohydrate; INHALE THE CONTENTS OF 1 CAPSULE VIA INHALATION DEVICE EVERY DAY  Dispense: 30 capsule; Refill: 2    Hakop Humbarger, PA-C

## 2023-06-01 NOTE — Patient Instructions (Signed)
Lab work today.

## 2023-06-02 LAB — LIPID PANEL
Chol/HDL Ratio: 4.9 {ratio} — ABNORMAL HIGH (ref 0.0–4.4)
Cholesterol, Total: 193 mg/dL (ref 100–199)
HDL: 39 mg/dL — ABNORMAL LOW (ref >39)
LDL Chol Calc (NIH): 119 mg/dL — ABNORMAL HIGH (ref 0–99)
Triglycerides: 200 mg/dL — ABNORMAL HIGH (ref 0–149)
VLDL Cholesterol Cal: 35 mg/dL (ref 5–40)

## 2023-06-02 LAB — CMP14+EGFR
ALT: 21 [IU]/L (ref 0–32)
AST: 14 [IU]/L (ref 0–40)
Albumin: 4.6 g/dL (ref 3.8–4.9)
Alkaline Phosphatase: 91 [IU]/L (ref 44–121)
BUN/Creatinine Ratio: 11 (ref 9–23)
BUN: 8 mg/dL (ref 6–24)
Bilirubin Total: 0.3 mg/dL (ref 0.0–1.2)
CO2: 24 mmol/L (ref 20–29)
Calcium: 9.6 mg/dL (ref 8.7–10.2)
Chloride: 100 mmol/L (ref 96–106)
Creatinine, Ser: 0.7 mg/dL (ref 0.57–1.00)
Globulin, Total: 1.9 g/dL (ref 1.5–4.5)
Glucose: 97 mg/dL (ref 70–99)
Potassium: 4.1 mmol/L (ref 3.5–5.2)
Sodium: 138 mmol/L (ref 134–144)
Total Protein: 6.5 g/dL (ref 6.0–8.5)
eGFR: 101 mL/min/{1.73_m2} (ref >59)

## 2023-06-02 LAB — CBC WITH DIFFERENTIAL/PLATELET
Basophils Absolute: 0 10*3/uL (ref 0.0–0.2)
Basos: 1 %
EOS (ABSOLUTE): 0.3 10*3/uL (ref 0.0–0.4)
Eos: 3 %
Hematocrit: 41.2 % (ref 34.0–46.6)
Hemoglobin: 13.6 g/dL (ref 11.1–15.9)
Immature Grans (Abs): 0 10*3/uL (ref 0.0–0.1)
Immature Granulocytes: 0 %
Lymphocytes Absolute: 2.6 10*3/uL (ref 0.7–3.1)
Lymphs: 35 %
MCH: 29.7 pg (ref 26.6–33.0)
MCHC: 33 g/dL (ref 31.5–35.7)
MCV: 90 fL (ref 79–97)
Monocytes Absolute: 0.6 10*3/uL (ref 0.1–0.9)
Monocytes: 7 %
Neutrophils Absolute: 3.9 10*3/uL (ref 1.4–7.0)
Neutrophils: 54 %
Platelets: 395 10*3/uL (ref 150–450)
RBC: 4.58 x10E6/uL (ref 3.77–5.28)
RDW: 13.2 % (ref 11.7–15.4)
WBC: 7.4 10*3/uL (ref 3.4–10.8)

## 2023-07-13 ENCOUNTER — Other Ambulatory Visit: Payer: Self-pay | Admitting: Physician Assistant

## 2023-07-13 DIAGNOSIS — G8929 Other chronic pain: Secondary | ICD-10-CM

## 2023-08-12 ENCOUNTER — Other Ambulatory Visit: Payer: Self-pay | Admitting: Physician Assistant

## 2023-08-12 DIAGNOSIS — I10 Essential (primary) hypertension: Secondary | ICD-10-CM

## 2023-08-12 DIAGNOSIS — F332 Major depressive disorder, recurrent severe without psychotic features: Secondary | ICD-10-CM

## 2023-08-13 ENCOUNTER — Other Ambulatory Visit: Payer: Self-pay

## 2023-08-13 DIAGNOSIS — I10 Essential (primary) hypertension: Secondary | ICD-10-CM

## 2023-08-13 MED ORDER — AMLODIPINE BESYLATE 10 MG PO TABS: ORAL_TABLET | ORAL | 2 refills | Status: DC

## 2023-08-30 ENCOUNTER — Other Ambulatory Visit: Payer: Self-pay | Admitting: Physician Assistant

## 2023-08-30 DIAGNOSIS — G8929 Other chronic pain: Secondary | ICD-10-CM

## 2023-09-13 ENCOUNTER — Other Ambulatory Visit: Payer: Self-pay | Admitting: Family Medicine

## 2023-09-13 ENCOUNTER — Other Ambulatory Visit: Payer: Self-pay | Admitting: Physician Assistant

## 2023-09-13 DIAGNOSIS — G8929 Other chronic pain: Secondary | ICD-10-CM

## 2023-09-13 DIAGNOSIS — J439 Emphysema, unspecified: Secondary | ICD-10-CM

## 2023-10-03 ENCOUNTER — Other Ambulatory Visit: Payer: Self-pay | Admitting: Family Medicine

## 2023-10-03 DIAGNOSIS — F332 Major depressive disorder, recurrent severe without psychotic features: Secondary | ICD-10-CM

## 2023-10-18 ENCOUNTER — Other Ambulatory Visit: Payer: Self-pay | Admitting: Family Medicine

## 2023-10-18 DIAGNOSIS — G8929 Other chronic pain: Secondary | ICD-10-CM

## 2023-10-19 ENCOUNTER — Other Ambulatory Visit: Payer: Self-pay

## 2023-10-19 DIAGNOSIS — J439 Emphysema, unspecified: Secondary | ICD-10-CM

## 2023-10-19 MED ORDER — BUDESONIDE-FORMOTEROL FUMARATE 80-4.5 MCG/ACT IN AERO: 2.0000 | INHALATION_SPRAY | Freq: Two times a day (BID) | RESPIRATORY_TRACT | 0 refills | Status: DC

## 2023-11-18 ENCOUNTER — Other Ambulatory Visit

## 2023-11-29 ENCOUNTER — Other Ambulatory Visit: Payer: Self-pay | Admitting: Family Medicine

## 2023-11-29 DIAGNOSIS — G8929 Other chronic pain: Secondary | ICD-10-CM

## 2023-12-06 ENCOUNTER — Other Ambulatory Visit: Payer: Self-pay | Admitting: Physician Assistant

## 2023-12-06 ENCOUNTER — Other Ambulatory Visit: Payer: Self-pay | Admitting: Family Medicine

## 2023-12-06 DIAGNOSIS — J439 Emphysema, unspecified: Secondary | ICD-10-CM

## 2023-12-14 ENCOUNTER — Telehealth: Payer: Self-pay

## 2023-12-14 NOTE — Telephone Encounter (Signed)
 Called resched orthotic appt.. VM box full

## 2024-01-04 ENCOUNTER — Encounter: Payer: Self-pay | Admitting: Physician Assistant

## 2024-01-04 ENCOUNTER — Telehealth: Admitting: Physician Assistant

## 2024-01-04 DIAGNOSIS — J069 Acute upper respiratory infection, unspecified: Secondary | ICD-10-CM | POA: Diagnosis not present

## 2024-01-04 DIAGNOSIS — B9689 Other specified bacterial agents as the cause of diseases classified elsewhere: Secondary | ICD-10-CM

## 2024-01-04 DIAGNOSIS — J432 Centrilobular emphysema: Secondary | ICD-10-CM | POA: Diagnosis not present

## 2024-01-04 MED ORDER — FLUTICASONE PROPIONATE 50 MCG/ACT NA SUSP
2.0000 | Freq: Every day | NASAL | 0 refills | Status: AC
Start: 2024-01-04 — End: ?

## 2024-01-04 MED ORDER — AZITHROMYCIN 250 MG PO TABS: ORAL_TABLET | ORAL | 0 refills | Status: AC

## 2024-01-04 MED ORDER — PROMETHAZINE-DM 6.25-15 MG/5ML PO SYRP: 5.0000 mL | ORAL_SOLUTION | Freq: Four times a day (QID) | ORAL | 0 refills | Status: DC | PRN

## 2024-01-04 NOTE — Patient Instructions (Signed)
 Jon VEAR Coats, thank you for joining Delon CHRISTELLA Dickinson, PA-C for today's virtual visit.  While this provider is not your primary care provider (PCP), if your PCP is located in our provider database this encounter information will be shared with them immediately following your visit.   A [facility] MyChart account gives you access to today's visit and all your visits, tests, and labs performed at [facility]  click here if you don't have a [facility] MyChart account or go to [email or web address]  Consent: (Patient) [name] provided verbal consent for this virtual visit at the beginning of the encounter.  Current Medications:  Current Outpatient Medications:    azithromycin  (ZITHROMAX ) 250 MG tablet, Take 2 tablets on day 1, then 1 tablet daily on days 2 through 5, Disp: 6 tablet, Rfl: 0   fluticasone  (FLONASE ) 50 MCG/ACT nasal spray, Place 2 sprays into both nostrils daily., Disp: 16 g, Rfl: 0   promethazine -dextromethorphan (PROMETHAZINE -DM) 6.25-15 MG/5ML syrup, Take 5 mLs by mouth 4 (four) times daily as needed., Disp: 118 mL, Rfl: 0   albuterol  (VENTOLIN  HFA) 108 (90 Base) MCG/ACT inhaler, Inhale 2 puffs into the lungs every 6 (six) hours as needed., Disp: 18 g, Rfl: 0   amLODipine  (NORVASC ) 10 MG tablet, TAKE (1) TABLET BY MOUTH ONCE DAILY., Disp: 90 tablet, Rfl: 2   aspirin  EC 81 MG tablet, Take 1 tablet (81 mg total) by mouth daily. Swallow whole., Disp: 90 tablet, Rfl: 3   budesonide -formoterol  (SYMBICORT ) 80-4.5 MCG/ACT inhaler, Inhale 2 puffs into the lungs 2 (two) times daily., Disp: 10.2 g, Rfl: 0   diclofenac  (VOLTAREN ) 75 MG EC tablet, TAKE ONE TABLET BY MOUTH TWICE DAILY, Disp: 60 tablet, Rfl: 0   DULoxetine  (CYMBALTA ) 30 MG capsule, 3 capsules daily as directed please schedule follow-up visit for this summer, Disp: 90 capsule, Rfl: 3   rosuvastatin  (CRESTOR ) 10 MG tablet, TAKE 1 TABLET(10 MG) BY MOUTH DAILY WITH SUPPER, Disp: 90 tablet,  Rfl: 1   tiotropium (SPIRIVA ) 18 MCG inhalation capsule, INHALE THE CONTENTS OF 1 CAPSULE VIA INHALATION DEVICE EVERY DAY, Disp: 30 capsule, Rfl: 2   Medications ordered in this encounter:  Meds ordered this encounter  Medications   azithromycin  (ZITHROMAX ) 250 MG tablet    Sig: Take 2 tablets on day 1, then 1 tablet daily on days 2 through 5    Dispense:  6 tablet    Refill:  0    Supervising Provider:   LAMPTEY, PHILIP O [[id number]]   promethazine -dextromethorphan (PROMETHAZINE -DM) 6.25-15 MG/5ML syrup    Sig: Take 5 mLs by mouth 4 (four) times daily as needed.    Dispense:  118 mL    Refill:  0    Supervising Provider:   LAMPTEY, PHILIP O [id number]   fluticasone  (FLONASE ) 50 MCG/ACT nasal spray    Sig: Place 2 sprays into both nostrils daily.    Dispense:  16 g    Refill:  0    Supervising Provider:   BLAISE ALEENE KIDD [[id number]]     *If you need refills on other medications prior to your next appointment, please contact your pharmacy*  Follow-Up: Call back or seek an in-person evaluation if the symptoms worsen or if the condition fails to improve as anticipated.  [facility] [phone]  Other Instructions Upper Respiratory Infection, Adult An upper respiratory infection (URI) is a common viral infection of the nose, throat, and upper air passages that lead to the lungs. The most common  type of URI is the common cold. URIs usually get better on their own, without medical treatment. What are the causes? A URI is caused by a virus. You may catch a virus by: Breathing in droplets from an infected person's cough or sneeze. Touching something that has been exposed to the virus (is contaminated) and then touching your mouth, nose, or eyes. What increases the risk? You are more likely to get a URI if: You are very young or very old. You have close contact with others, such as at work, school, or a health care facility. You smoke. You have long-term (chronic) heart  or lung disease. You have a weakened disease-fighting system (immune system). You have nasal allergies or asthma. You are experiencing a lot of stress. You have poor nutrition. What are the signs or symptoms? A URI usually involves some of the following symptoms: Runny or stuffy (congested) nose. Cough. Sneezing. Sore throat. Headache. Fatigue. Fever. Loss of appetite. Pain in your forehead, behind your eyes, and over your cheekbones (sinus pain). Muscle aches. Redness or irritation of the eyes. Pressure in the ears or face. How is this diagnosed? This condition may be diagnosed based on your medical history and symptoms, and a physical exam. Your health care provider may use a swab to take a mucus sample from your nose (nasal swab). This sample can be tested to determine what virus is causing the illness. How is this treated? URIs usually get better on their own within 7-10 days. Medicines cannot cure URIs, but your health care provider may recommend certain medicines to help relieve symptoms, such as: Over-the-counter cold medicines. Cough suppressants. Coughing is a type of defense against infection that helps to clear the respiratory system, so take these medicines only as recommended by your health care provider. Fever-reducing medicines. Follow these instructions at home: Activity Rest as needed. If you have a fever, stay home from work or school until your fever is gone or until your health care provider says your URI cannot spread to other people (is no longer contagious). Your health care provider may have you wear a face mask to prevent your infection from spreading. Relieving symptoms Gargle with a mixture of salt and water 3-4 times a day or as needed. To make salt water, completely dissolve -1 tsp (3-6 g) of salt in 1 cup (237 mL) of warm water. Use a cool-mist humidifier to add moisture to the air. This can help you breathe more easily. Eating and drinking  Drink  enough fluid to keep your urine pale yellow. Eat soups and other clear broths. General instructions  Take over-the-counter and prescription medicines only as told by your health care provider. These include cold medicines, fever reducers, and cough suppressants. Do not use any products that contain nicotine  or tobacco. These products include cigarettes, chewing tobacco, and vaping devices, such as e-cigarettes. If you need help quitting, ask your health care provider. Stay away from secondhand smoke. Stay up to date on all immunizations, including the yearly (annual) flu vaccine. Keep all follow-up visits. This is important. How to prevent the spread of infection to others URIs can be contagious. To prevent the infection from spreading: Wash your hands with soap and water for at least 20 seconds. If soap and water are not available, use hand sanitizer. Avoid touching your mouth, face, eyes, or nose. Cough or sneeze into a tissue or your sleeve or elbow instead of into your hand or into the air.  Contact a health care  provider if: You are getting worse instead of better. You have a fever or chills. Your mucus is brown or red. You have yellow or brown discharge coming from your nose. You have pain in your face, especially when you bend forward. You have swollen neck glands. You have pain while swallowing. You have white areas in the back of your throat. Get help right away if: You have shortness of breath that gets worse. You have severe or persistent: Headache. Ear pain. Sinus pain. Chest pain. You have chronic lung disease along with any of the following: Making high-pitched whistling sounds when you breathe, most often when you breathe out (wheezing). Prolonged cough (more than 14 days). Coughing up blood. A change in your usual mucus. You have a stiff neck. You have changes in your: Vision. Hearing. Thinking. Mood. These symptoms may be an emergency. Get help right away.  Call 911. Do not wait to see if the symptoms will go away. Do not drive yourself to the hospital. Summary An upper respiratory infection (URI) is a common infection of the nose, throat, and upper air passages that lead to the lungs. A URI is caused by a virus. URIs usually get better on their own within 7-10 days. Medicines cannot cure URIs, but your health care provider may recommend certain medicines to help relieve symptoms. This information is not intended to replace advice given to you by your health care provider. Make sure you discuss any questions you have with your health care provider. Document Revised: 12/18/2020 Document Reviewed: 12/18/2020 Elsevier Patient Education  2024 [employer]   If you have been instructed to have an in-person evaluation today at a local Urgent Care facility, please use the link below. It will take you to a list of all of our available [facility], including address, phone number and hours of operation. Please do not delay care.  [facility]  If you or a family member do not have a primary care provider, use the link below to schedule a visit and establish care. When you choose a [facility] primary care physician or advanced practice provider, you gain a long-term partner in health. Find a Primary Care Provider  Learn more about [facility]'s in-office and virtual care options: [facility] - Get Care Now

## 2024-01-04 NOTE — Progress Notes (Signed)
 Virtual Visit Consent   [name], you are scheduled for a virtual visit with a [facility] provider today. Just as with appointments in the office, your consent must be obtained to participate. Your consent will be active for this visit and any virtual visit you may have with one of our providers in the next 365 days. If you have a MyChart account, a copy of this consent can be sent to you electronically.  As this is a virtual visit, video technology does not allow for your provider to perform a traditional examination. This may limit your provider's ability to fully assess your condition. If your provider identifies any concerns that need to be evaluated in person or the need to arrange testing (such as labs, EKG, etc.), we will make arrangements to do so. Although advances in technology are sophisticated, we cannot ensure that it will always work on either your end or our end. If the connection with a video visit is poor, the visit may have to be switched to a telephone visit. With either a video or telephone visit, we are not always able to ensure that we have a secure connection.  By engaging in this virtual visit, you consent to the provision of healthcare and authorize for your insurance to be billed (if applicable) for the services provided during this visit. Depending on your insurance coverage, you may receive a charge related to this service.  I need to obtain your verbal consent now. Are you willing to proceed with your visit today? [name] has provided verbal consent on 01/04/2024 for a virtual visit (video or telephone). Delon CHRISTELLA Dickinson, PA-C  Date: 01/04/2024 9:19 AM   Virtual Visit via Video Note   I, Delon CHRISTELLA Dickinson, connected with  [name]  ([id number], [birth year]) on 01/04/24 at  8:15 AM EDT by a video-enabled telemedicine application and verified that I am speaking with the correct person using two identifiers.  Location: Patient: Virtual  Visit Location Patient: Home Provider: Virtual Visit Location Provider: Home Office   I discussed the limitations of evaluation and management by telemedicine and the availability of in person appointments. The patient expressed understanding and agreed to proceed.    History of Present Illness: [name] is a 58 y.o. who identifies as a female who was assigned female at birth, and is being seen today for URI symptoms.  HPI: URI  This is a new problem. The current episode started yesterday (Symptoms started yesterday after watching her grandchildren over the weekend). The problem has been gradually worsening. The maximum temperature recorded prior to her arrival was 102 - 102.9 F (102 last night, now normal). The fever has been present for Less than 1 day. Associated symptoms include congestion, coughing, diarrhea (mild), headaches, nausea, rhinorrhea (and post nasal drainage), a sore throat (mild upon awakening but improved with hot coffee) and vomiting. Pertinent negatives include no ear pain, neck pain, plugged ear sensation, sinus pain or wheezing (no increase from baseline). Associated symptoms comments: myalgias. Treatments tried: dayquil, tylenol , cough drops. The treatment provided no relief.     Problems:  Patient Active Problem List   Diagnosis Date Noted   Plantar fasciitis of left foot 01/22/2023   Long term current use of antipsychotic medication 01/12/2023   Puncture wound 10/01/2022   PTSD (post-traumatic stress disorder) 02/05/2022   Psychophysiological insomnia 02/05/2022   Borderline personality disorder (HCC) 02/05/2022   Major depressive disorder, recurrent severe without psychotic features (HCC) 02/05/2022  Long term (current) use of non-steroidal anti-inflammatories (nsaid) 02/05/2022   Chronic R shoulder, hip, back pain 02/05/2022   Aortic atherosclerosis (HCC) 07/21/2021   Morbid obesity (HCC) 07/21/2021   Purpura senilis (HCC) 07/21/2021   Benign  essential HTN 10/16/2020   GAD (generalized anxiety disorder) 04/07/2019   Fatty liver 06/08/2016   Diastolic dysfunction 05/04/2016   Tobacco use disorder 04/29/2016   COPD (chronic obstructive pulmonary disease) with emphysema (HCC) 12/20/2015    Allergies:  Allergies  Allergen Reactions   Gabapentin  Hives   Augmentin [Amoxicillin-Pot Clavulanate] Nausea And Vomiting   Lamictal  [Lamotrigine ] Rash   Medications:  Current Outpatient Medications:    azithromycin  (ZITHROMAX ) 250 MG tablet, Take 2 tablets on day 1, then 1 tablet daily on days 2 through 5, Disp: 6 tablet, Rfl: 0   fluticasone  (FLONASE ) 50 MCG/ACT nasal spray, Place 2 sprays into both nostrils daily., Disp: 16 g, Rfl: 0   promethazine -dextromethorphan (PROMETHAZINE -DM) 6.25-15 MG/5ML syrup, Take 5 mLs by mouth 4 (four) times daily as needed., Disp: 118 mL, Rfl: 0   albuterol  (VENTOLIN  HFA) 108 (90 Base) MCG/ACT inhaler, Inhale 2 puffs into the lungs every 6 (six) hours as needed., Disp: 18 g, Rfl: 0   amLODipine  (NORVASC ) 10 MG tablet, TAKE (1) TABLET BY MOUTH ONCE DAILY., Disp: 90 tablet, Rfl: 2   aspirin  EC 81 MG tablet, Take 1 tablet (81 mg total) by mouth daily. Swallow whole., Disp: 90 tablet, Rfl: 3   budesonide -formoterol  (SYMBICORT ) 80-4.5 MCG/ACT inhaler, Inhale 2 puffs into the lungs 2 (two) times daily., Disp: 10.2 g, Rfl: 0   diclofenac  (VOLTAREN ) 75 MG EC tablet, TAKE ONE TABLET BY MOUTH TWICE DAILY, Disp: 60 tablet, Rfl: 0   DULoxetine  (CYMBALTA ) 30 MG capsule, 3 capsules daily as directed please schedule follow-up visit for this summer, Disp: 90 capsule, Rfl: 3   rosuvastatin  (CRESTOR ) 10 MG tablet, TAKE 1 TABLET(10 MG) BY MOUTH DAILY WITH SUPPER, Disp: 90 tablet, Rfl: 1   tiotropium (SPIRIVA ) 18 MCG inhalation capsule, INHALE THE CONTENTS OF 1 CAPSULE VIA INHALATION DEVICE EVERY DAY, Disp: 30 capsule, Rfl: 2  Observations/Objective: Patient is well-developed, well-nourished in no acute distress.  Resting  comfortably at home.  Head is normocephalic, atraumatic.  No labored breathing.  Speech is clear and coherent with logical content.  Patient is alert and oriented at baseline.    Assessment and Plan: 1. Bacterial upper respiratory infection (Primary) - azithromycin  (ZITHROMAX ) 250 MG tablet; Take 2 tablets on day 1, then 1 tablet daily on days 2 through 5  Dispense: 6 tablet; Refill: 0 - promethazine -dextromethorphan (PROMETHAZINE -DM) 6.25-15 MG/5ML syrup; Take 5 mLs by mouth 4 (four) times daily as needed.  Dispense: 118 mL; Refill: 0 - fluticasone  (FLONASE ) 50 MCG/ACT nasal spray; Place 2 sprays into both nostrils daily.  Dispense: 16 g; Refill: 0  2. Centrilobular emphysema (HCC)  - Worsening  - Will treat with Z-pack and Promethazine  DM cough syrup - Can add Mucinex  - Push fluids.  - Rest.  - Steam and humidifier can help - Seek in person evaluation if worsening or symptoms fail to improve    Follow Up Instructions: I discussed the assessment and treatment plan with the patient. The patient was provided an opportunity to ask questions and all were answered. The patient agreed with the plan and demonstrated an understanding of the instructions.  A copy of instructions were sent to the patient via MyChart unless otherwise noted below.     The patient was advised  to call back or seek an in-person evaluation if the symptoms worsen or if the condition fails to improve as anticipated.    Delon CHRISTELLA Dickinson, PA-C

## 2024-01-05 ENCOUNTER — Other Ambulatory Visit: Payer: Self-pay | Admitting: Family Medicine

## 2024-01-05 DIAGNOSIS — J439 Emphysema, unspecified: Secondary | ICD-10-CM

## 2024-01-07 ENCOUNTER — Other Ambulatory Visit: Payer: Self-pay | Admitting: Family Medicine

## 2024-01-07 DIAGNOSIS — G8929 Other chronic pain: Secondary | ICD-10-CM

## 2024-01-07 MED ORDER — DICLOFENAC SODIUM 75 MG PO TBEC: 75.0000 mg | DELAYED_RELEASE_TABLET | Freq: Two times a day (BID) | ORAL | 3 refills | Status: DC

## 2024-01-17 ENCOUNTER — Other Ambulatory Visit: Payer: Self-pay | Admitting: Physician Assistant

## 2024-01-17 DIAGNOSIS — J439 Emphysema, unspecified: Secondary | ICD-10-CM

## 2024-01-21 ENCOUNTER — Ambulatory Visit: Admitting: Family Medicine

## 2024-01-21 VITALS — BP 134/86 | HR 83 | Temp 97.9°F | Ht 61.0 in | Wt 223.6 lb

## 2024-01-21 DIAGNOSIS — I700 Atherosclerosis of aorta: Secondary | ICD-10-CM | POA: Diagnosis not present

## 2024-01-21 DIAGNOSIS — F332 Major depressive disorder, recurrent severe without psychotic features: Secondary | ICD-10-CM

## 2024-01-21 DIAGNOSIS — G8929 Other chronic pain: Secondary | ICD-10-CM

## 2024-01-21 DIAGNOSIS — I10 Essential (primary) hypertension: Secondary | ICD-10-CM | POA: Diagnosis not present

## 2024-01-21 DIAGNOSIS — J439 Emphysema, unspecified: Secondary | ICD-10-CM | POA: Diagnosis not present

## 2024-01-21 DIAGNOSIS — Z1211 Encounter for screening for malignant neoplasm of colon: Secondary | ICD-10-CM

## 2024-01-21 DIAGNOSIS — Z23 Encounter for immunization: Secondary | ICD-10-CM | POA: Diagnosis not present

## 2024-01-21 DIAGNOSIS — Z1231 Encounter for screening mammogram for malignant neoplasm of breast: Secondary | ICD-10-CM

## 2024-01-21 MED ORDER — ROSUVASTATIN CALCIUM 10 MG PO TABS: ORAL_TABLET | ORAL | 1 refills | Status: AC

## 2024-01-21 MED ORDER — DULOXETINE HCL 30 MG PO CPEP: ORAL_CAPSULE | ORAL | 3 refills | Status: DC

## 2024-01-21 MED ORDER — BUDESONIDE-FORMOTEROL FUMARATE 80-4.5 MCG/ACT IN AERO: 2.0000 | INHALATION_SPRAY | Freq: Two times a day (BID) | RESPIRATORY_TRACT | 0 refills | Status: DC

## 2024-01-21 MED ORDER — ALBUTEROL SULFATE HFA 108 (90 BASE) MCG/ACT IN AERS: 2.0000 | INHALATION_SPRAY | Freq: Four times a day (QID) | RESPIRATORY_TRACT | 6 refills | Status: AC | PRN

## 2024-01-21 MED ORDER — AMLODIPINE BESYLATE 10 MG PO TABS: ORAL_TABLET | ORAL | 2 refills | Status: AC

## 2024-01-21 MED ORDER — DICLOFENAC SODIUM 75 MG PO TBEC: 75.0000 mg | DELAYED_RELEASE_TABLET | Freq: Two times a day (BID) | ORAL | 3 refills | Status: AC

## 2024-01-21 NOTE — Progress Notes (Signed)
 Subjective:    Patient ID: [name], female    DOB: 1966/04/16, 58 y.o.   MRN: [id number]  HPI Discussed the use of AI scribe software for clinical note transcription with the patient, who gave verbal consent to proceed.  History of Present Illness   [name] is a 58 year old female with spinal stenosis who presents with shoulder and hip pain.  She experiences significant pain primarily in her shoulders and hips, which she attributes to her known spinal stenosis. 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 She describes the pain as sometimes extremely painful, impacting her ability to tolerate it. She is currently taking Toradol , an anti-inflammatory medication, to manage the pain.  She describes a 'weird feeling' in her legs where she cannot stretch them out fully, and occasionally feels a 'huge knot' behind her knee, which she does not always notice. This sensation is particularly noticeable after sitting for extended periods, such as when driving, and is relieved by walking and stretching.  She has experienced a recent exacerbation of breathing difficulties, which she attributes to a cold caught from her grandchildren. This has led to episodes of significant respiratory distress, particularly when she forgot her inhalers at home. She sought virtual urgent care, where she was prescribed a CPAP and cough medicine. Her breathing is sometimes fine, but at other times she needs to stop and rest. She continues to smoke.  Her dietary habits include skipping breakfast and not eating until she returns home, leading to overeating in the evening. She has not exercised over the summer, citing the heat and her work schedule as barriers. She wants to retire in two years after paying off her car and house.        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 Managed with Toradol . - Continue Toradol  for pain management. - Consider referral to orthopedics or back specialist if pain becomes unmanageable.  Suspected Baker's cyst, right knee Intermittent swelling and discomfort behind right knee, suggestive of Baker's cyst. - Consider referral to orthopedics if symptoms worsen or become constant.  Chronic obstructive pulmonary disease (COPD) Recent COPD exacerbation post-viral illness. Managed with CPAP and cough medicine. History of leaving inhalers at home. - Consider discussing the use of prednisone  for future exacerbations if necessary.  Tobacco use disorder Continued tobacco use despite desire to quit. Lacks motivation or strategy.  Obesity Obesity with difficulty maintaining healthy diet and exercise due to work schedule. Weight loss medications discussed but cost-prohibitive. - Encourage healthy dietary habits and regular physical activity. - Discuss potential future use of weight loss medications if costs decrease.  Depression Mood fluctuations with feelings of being overwhelmed by work and life circumstances.     1. Aortic atherosclerosis (HCC) Under decent control with cholesterol medicine continue cholesterol medicine tolerating well - rosuvastatin  (CRESTOR ) 10 MG tablet; TAKE 1 TABLET(10 MG) BY MOUTH DAILY WITH SUPPER  Dispense: 90 tablet; Refill: 1  2. Pulmonary emphysema, unspecified emphysema type (HCC) Try to  quit smoking this was counseled she will do her best use inhalers as directed - albuterol  (VENTOLIN  HFA) 108 (90  Base) MCG/ACT inhaler; Inhale 2 puffs into the lungs every 6 (six) hours as needed.  Dispense: 18 g; Refill: 6 - budesonide -formoterol  (SYMBICORT ) 80-4.5 MCG/ACT inhaler; Inhale 2 puffs into the lungs 2 (two) times daily.  Dispense: 10.2 g; Refill: 0  3. Benign essential HTN Blood pressure good control continue current measures - amLODipine  (NORVASC ) 10 MG tablet; TAKE (1) TABLET BY MOUTH ONCE DAILY.  Dispense: 90 tablet; Refill: 2  4. Chronic R shoulder, hip, back pain May use Voltaren  as long as it do not cause stomach pain or bleeding in the stools - diclofenac  (VOLTAREN ) 75 MG EC tablet; Take 1 tablet (75 mg total) by mouth 2 (two) times daily.  Dispense: 60 tablet; Refill: 3  5. Major depressive disorder, recurrent severe without psychotic features (HCC) Under decent control under a lot of stress with work as 2 more years to go - DULoxetine  (CYMBALTA ) 30 MG capsule; 3 capsules daily as directed please schedule follow-up visit for this summer  Dispense: 90 capsule; Refill: 3  6. Immunization due (Primary) Vaccine today - Pneumococcal conjugate vaccine 20-valent (Prevnar 20)  7. Screening mammogram for breast cancer Screening mammo recommended - MM 3D SCREENING MAMMOGRAM BILATERAL BREAST  8. Screening for colon cancer Colonoscopy recommended - Ambulatory referral to Gastroenterology   Follow-up in 6 months Lab work by December

## 2024-02-28 ENCOUNTER — Telehealth: Payer: Self-pay | Admitting: Family Medicine

## 2024-02-28 ENCOUNTER — Other Ambulatory Visit: Payer: Self-pay | Admitting: Nurse Practitioner

## 2024-02-28 DIAGNOSIS — J439 Emphysema, unspecified: Secondary | ICD-10-CM

## 2024-02-28 MED ORDER — TIOTROPIUM BROMIDE MONOHYDRATE 18 MCG IN CAPS: ORAL_CAPSULE | RESPIRATORY_TRACT | 2 refills | Status: DC

## 2024-02-28 NOTE — Telephone Encounter (Signed)
 Refill on   tiotropium (SPIRIVA ) 18 MCG inhalation capsule   [facility]

## 2024-03-09 ENCOUNTER — Encounter (INDEPENDENT_AMBULATORY_CARE_PROVIDER_SITE_OTHER): Payer: Self-pay | Admitting: *Deleted

## 2024-03-23 ENCOUNTER — Other Ambulatory Visit: Payer: Self-pay | Admitting: Family Medicine

## 2024-03-23 DIAGNOSIS — J439 Emphysema, unspecified: Secondary | ICD-10-CM

## 2024-05-16 ENCOUNTER — Other Ambulatory Visit: Payer: Self-pay | Admitting: Nurse Practitioner

## 2024-05-16 ENCOUNTER — Other Ambulatory Visit: Payer: Self-pay | Admitting: Family Medicine

## 2024-05-16 DIAGNOSIS — F332 Major depressive disorder, recurrent severe without psychotic features: Secondary | ICD-10-CM

## 2024-05-16 DIAGNOSIS — J439 Emphysema, unspecified: Secondary | ICD-10-CM

## 2024-06-20 ENCOUNTER — Ambulatory Visit: Admitting: Nurse Practitioner

## 2024-06-20 ENCOUNTER — Ambulatory Visit: Payer: Self-pay

## 2024-06-20 ENCOUNTER — Encounter: Payer: Self-pay | Admitting: Nurse Practitioner

## 2024-06-20 VITALS — BP 141/79 | HR 84 | Temp 98.1°F | Ht 61.0 in | Wt 232.1 lb

## 2024-06-20 DIAGNOSIS — R319 Hematuria, unspecified: Secondary | ICD-10-CM

## 2024-06-20 DIAGNOSIS — R350 Frequency of micturition: Secondary | ICD-10-CM | POA: Diagnosis not present

## 2024-06-20 DIAGNOSIS — R102 Pelvic and perineal pain unspecified side: Secondary | ICD-10-CM

## 2024-06-20 LAB — POCT URINALYSIS DIPSTICK
Bilirubin, UA: NEGATIVE
Blood, UA: 250
Glucose, UA: NEGATIVE
Ketones, UA: NEGATIVE
Nitrite, UA: NEGATIVE
Protein, UA: POSITIVE — AB
Spec Grav, UA: <=1.005 — AB
Urobilinogen, UA: 0.2 U/dL
pH, UA: 8

## 2024-06-20 MED ORDER — CEPHALEXIN 500 MG PO CAPS: 500.0000 mg | ORAL_CAPSULE | Freq: Three times a day (TID) | ORAL | 0 refills | Status: AC

## 2024-06-20 NOTE — Telephone Encounter (Signed)
 FYI Only or Action Required?: FYI only for provider: appointment scheduled on 06/20/2024 3:30 PMHoskins, [name], [facility].  Patient was last seen in primary care on 01/21/2024 by Alphonsa Glendia LABOR, MD.  Called Nurse Triage reporting Hematuria and Dysuria.  Symptoms began today.  Interventions attempted: Nothing.  Symptoms are: stable.  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 Mild lower back pain some mild abdominal pain. Burning mild with urination as well. Has COPD shortness of breath at baseline  was out of inhalers and was going to pick up refills at pharmacy but then urinary symptoms started. Booked same day visit at alternative office PCP office did not have opening booked 06/20/2024 3:30 PM Mauro Deanna BROCKS, NP [facility]        1. SEVERITY: How bad is the pain?  (e.g., Scale 1-10; mild, moderate, or severe)     mild 2. FREQUENCY: How many times have you had painful urination today?      yes 3. PATTERN: Is pain present every time you urinate or just sometimes?      Every time today  4. ONSET: When did the painful urination start?      today 5. FEVER: Do you have a fever? If Yes, ask: What is your temperature, how was it measured, and when did it start?     Denies   7. CAUSE: What do you think is causing the painful urination?  (e.g., UTI, scratch, Herpes sore)     Unknown   8. OTHER SYMPTOMS: Do you have any other symptoms? (e.g., blood in urine, flank pain, genital sores, urgency, vaginal discharge) Blood in urine, lower back pain, urinary urgency with leakage and frequency        Patient denies the following vomiting , chills, fever  Protocols used: Urination Pain - [facility]  Message from [facility] sent at 06/20/2024  1:39 PM EST  Reason for Triage: patient says that she is having pain and bleeding when going to the bathroom

## 2024-06-20 NOTE — Progress Notes (Unsigned)
" ° °  Subjective:    Patient ID: [name], female    DOB: 1965-08-06, 59 y.o.   MRN: [id number]  HPI Patient is here for having frequent urination, Patient noticed twice today that there has been blood in her urine Patient stated she has had lower back pain and lower cramps in the abdominal area  Discussed the use of AI scribe software for clinical note transcription with the patient, who gave verbal consent to proceed.  History of Present Illness [name] is a 59 year old female who presents with urinary symptoms including urgency, frequency, and dysuria.  She experienced the onset of urinary symptoms this morning, including urgency, frequency, and dysuria. 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 She did not notice any blood in her urine.  No recent history of bladder or kidney infections. No vaginal symptoms such as discharge or bleeding. All reproductive organs are intact. No recent vaginal bleeding. Post menopausal with tubal ligation.  She reports side pain, which she attributes to wearing tight leggings, but no specific pain in the kidney area. No nausea, vomiting, fever, or changes in fluid intake.  Social history reveals a new sexual partner since Thanksgiving, with unprotected intercourse. No sores, rash, or lesions in the genital area.  History of alternating bowel habits similar to irritable bowel syndrome, but no current issues with diarrhea or constipation.   Social History[1]      Objective:   Physical Exam NAD.  Alert, oriented.  Lungs clear.  Heart regular rate rhythm.  No CVA or flank tenderness noted on exam.  Abdomen soft nondistended with minimal suprapubic area discomfort on exam.  No rebound or guarding.  Urine sample at lab has a slightly pinkish color not quite consistent with hematuria. Today's Vitals   06/20/24 1532   BP: (!) 141/79  Pulse: 84  Temp: 98.1 F (36.7 C)  SpO2: 97%  Weight: 232 lb 2 oz (105.3 kg)  Height: 5' 1 (1.549 m)   Body mass index is 43.86 kg/m.       Assessment & Plan:  1. Hematuria, unspecified type (Primary) Recommend rechecking urine at her next visit on 07/24/2024 to make sure hematuria has resolved. - POCT urinalysis dipstick - Urine Culture - cephALEXin  (KEFLEX ) 500 MG capsule; Take 1 capsule (500 mg total) by mouth 3 (three) times daily. X 7 days  Dispense: 21 capsule; Refill: 0  2. Frequent urination Acute urinary symptoms with hematuria and leukocytes. Negative nitrites. Differential includes bladder infection. - POCT urinalysis dipstick - Urine Culture  3. Pelvic pain Recent unprotected sexual activity warrants STI screening. - Conducted urine test for gonorrhea and chlamydia. Discussed safe sex practices. - Chlamydia/Gonococcus/Trichomonas, NAA  Recommend recheck if symptoms worsen or persist including a pelvic exam at that time depending on test results.  Warning signs reviewed.        [1]  Social History Tobacco Use   Smoking status: Every Day    Current packs/day: 1.50    Types: Cigarettes   Smokeless tobacco: Never  Vaping Use   Vaping status: Never Used  Substance Use Topics   Alcohol  use: Yes    Comment: None since January 2023. Previously 1-2 glasses of wine per night.   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   "

## 2024-06-21 ENCOUNTER — Ambulatory Visit: Payer: Self-pay | Admitting: Family Medicine

## 2024-06-22 ENCOUNTER — Encounter: Payer: Self-pay | Admitting: Nurse Practitioner

## 2024-06-22 LAB — URINE CULTURE

## 2024-06-28 ENCOUNTER — Other Ambulatory Visit: Payer: Self-pay | Admitting: Family Medicine

## 2024-06-28 DIAGNOSIS — J439 Emphysema, unspecified: Secondary | ICD-10-CM

## 2024-06-28 LAB — CHLAMYDIA/GONOCOCCUS/TRICHOMONAS, NAA
Chlamydia by NAA: NEGATIVE
Gonococcus by NAA: NEGATIVE
Trich vag by NAA: NEGATIVE

## 2024-07-24 ENCOUNTER — Ambulatory Visit: Admitting: Family Medicine
